# Patient Record
Sex: Male | Born: 1955
Health system: Southern US, Community
[De-identification: ages and names within clinical notes are randomized; demographics above are authoritative.]

## PROBLEM LIST (undated history)

## (undated) DIAGNOSIS — F329 Major depressive disorder, single episode, unspecified: Secondary | ICD-10-CM

## (undated) DIAGNOSIS — I421 Obstructive hypertrophic cardiomyopathy: Secondary | ICD-10-CM

## (undated) DIAGNOSIS — G43909 Migraine, unspecified, not intractable, without status migrainosus: Secondary | ICD-10-CM

## (undated) DIAGNOSIS — M542 Cervicalgia: Secondary | ICD-10-CM

## (undated) DIAGNOSIS — M1990 Unspecified osteoarthritis, unspecified site: Secondary | ICD-10-CM

## (undated) DIAGNOSIS — J302 Other seasonal allergic rhinitis: Secondary | ICD-10-CM

## (undated) DIAGNOSIS — Z5189 Encounter for other specified aftercare: Secondary | ICD-10-CM

## (undated) DIAGNOSIS — E785 Hyperlipidemia, unspecified: Secondary | ICD-10-CM

## (undated) DIAGNOSIS — G709 Myoneural disorder, unspecified: Secondary | ICD-10-CM

## (undated) DIAGNOSIS — G8929 Other chronic pain: Secondary | ICD-10-CM

## (undated) DIAGNOSIS — T7840XA Allergy, unspecified, initial encounter: Secondary | ICD-10-CM

## (undated) DIAGNOSIS — K589 Irritable bowel syndrome without diarrhea: Secondary | ICD-10-CM

## (undated) DIAGNOSIS — K219 Gastro-esophageal reflux disease without esophagitis: Secondary | ICD-10-CM

## (undated) DIAGNOSIS — F32A Depression, unspecified: Secondary | ICD-10-CM

## (undated) DIAGNOSIS — N200 Calculus of kidney: Secondary | ICD-10-CM

## (undated) DIAGNOSIS — J45909 Unspecified asthma, uncomplicated: Secondary | ICD-10-CM

## (undated) DIAGNOSIS — H269 Unspecified cataract: Secondary | ICD-10-CM

## (undated) DIAGNOSIS — B019 Varicella without complication: Secondary | ICD-10-CM

## (undated) DIAGNOSIS — E669 Obesity, unspecified: Secondary | ICD-10-CM

## (undated) HISTORY — DX: Other seasonal allergic rhinitis: J30.2

## (undated) HISTORY — DX: Other chronic pain: G89.29

## (undated) HISTORY — PX: MOUTH SURGERY: SHX715

## (undated) HISTORY — DX: Unspecified asthma, uncomplicated: J45.909

## (undated) HISTORY — DX: Allergy, unspecified, initial encounter: T78.40XA

## (undated) HISTORY — DX: Myoneural disorder, unspecified: G70.9

## (undated) HISTORY — DX: Depression, unspecified: F32.A

## (undated) HISTORY — DX: Migraine, unspecified, not intractable, without status migrainosus: G43.909

## (undated) HISTORY — DX: Hyperlipidemia, unspecified: E78.5

## (undated) HISTORY — DX: Unspecified cataract: H26.9

## (undated) HISTORY — DX: Gastro-esophageal reflux disease without esophagitis: K21.9

## (undated) HISTORY — DX: Cervicalgia: M54.2

## (undated) HISTORY — DX: Major depressive disorder, single episode, unspecified: F32.9

## (undated) HISTORY — DX: Varicella without complication: B01.9

## (undated) HISTORY — DX: Encounter for other specified aftercare: Z51.89

## (undated) HISTORY — DX: Calculus of kidney: N20.0

## (undated) HISTORY — DX: Unspecified osteoarthritis, unspecified site: M19.90

## (undated) HISTORY — DX: Obstructive hypertrophic cardiomyopathy: I42.1

## (undated) HISTORY — DX: Obesity, unspecified: E66.9

## (undated) HISTORY — DX: Irritable bowel syndrome without diarrhea: K58.9

---

## 1978-10-25 HISTORY — PX: OTHER SURGICAL HISTORY: SHX169

## 1983-10-26 DIAGNOSIS — Z5189 Encounter for other specified aftercare: Secondary | ICD-10-CM

## 1983-10-26 HISTORY — DX: Encounter for other specified aftercare: Z51.89

## 1983-10-26 HISTORY — PX: OTHER SURGICAL HISTORY: SHX169

## 1988-10-25 HISTORY — PX: OTHER SURGICAL HISTORY: SHX169

## 2000-11-25 DIAGNOSIS — S199XXA Unspecified injury of neck, initial encounter: Secondary | ICD-10-CM | POA: Insufficient documentation

## 2006-06-23 ENCOUNTER — Ambulatory Visit: Payer: Self-pay | Admitting: Family Medicine

## 2006-08-14 ENCOUNTER — Emergency Department (HOSPITAL_COMMUNITY): Admission: EM | Admit: 2006-08-14 | Discharge: 2006-08-14 | Payer: Self-pay | Admitting: Emergency Medicine

## 2007-02-13 ENCOUNTER — Ambulatory Visit: Payer: Self-pay | Admitting: Family Medicine

## 2007-03-09 ENCOUNTER — Ambulatory Visit: Payer: Self-pay | Admitting: Family Medicine

## 2007-09-08 ENCOUNTER — Ambulatory Visit: Payer: Self-pay | Admitting: Family Medicine

## 2007-10-16 ENCOUNTER — Ambulatory Visit: Payer: Self-pay | Admitting: Family Medicine

## 2007-11-10 ENCOUNTER — Ambulatory Visit: Payer: Self-pay | Admitting: Family Medicine

## 2008-05-30 ENCOUNTER — Ambulatory Visit: Payer: Self-pay | Admitting: Family Medicine

## 2008-07-29 ENCOUNTER — Ambulatory Visit: Payer: Self-pay | Admitting: Family Medicine

## 2009-03-31 ENCOUNTER — Ambulatory Visit: Payer: Self-pay | Admitting: Family Medicine

## 2009-05-23 ENCOUNTER — Ambulatory Visit: Payer: Self-pay | Admitting: Family Medicine

## 2009-07-08 ENCOUNTER — Ambulatory Visit: Payer: Self-pay | Admitting: Family Medicine

## 2009-08-22 ENCOUNTER — Ambulatory Visit: Payer: Self-pay | Admitting: Family Medicine

## 2009-09-23 ENCOUNTER — Ambulatory Visit: Payer: Self-pay | Admitting: Family Medicine

## 2010-06-03 ENCOUNTER — Ambulatory Visit: Payer: Self-pay | Admitting: Family Medicine

## 2010-06-10 ENCOUNTER — Ambulatory Visit: Payer: Self-pay | Admitting: Vascular Surgery

## 2010-06-10 ENCOUNTER — Encounter: Payer: Self-pay | Admitting: Family Medicine

## 2010-06-10 ENCOUNTER — Ambulatory Visit (HOSPITAL_COMMUNITY): Admission: RE | Admit: 2010-06-10 | Discharge: 2010-06-10 | Payer: Self-pay | Admitting: Family Medicine

## 2010-07-29 ENCOUNTER — Ambulatory Visit: Payer: Self-pay | Admitting: Family Medicine

## 2010-08-05 ENCOUNTER — Ambulatory Visit: Payer: Self-pay | Admitting: Family Medicine

## 2011-03-02 ENCOUNTER — Encounter: Payer: Self-pay | Admitting: Family Medicine

## 2011-03-02 ENCOUNTER — Ambulatory Visit (INDEPENDENT_AMBULATORY_CARE_PROVIDER_SITE_OTHER): Payer: Federal, State, Local not specified - PPO | Admitting: Family Medicine

## 2011-03-02 VITALS — BP 120/74 | HR 88 | Wt 180.0 lb

## 2011-03-02 DIAGNOSIS — Z209 Contact with and (suspected) exposure to unspecified communicable disease: Secondary | ICD-10-CM

## 2011-03-02 DIAGNOSIS — M549 Dorsalgia, unspecified: Secondary | ICD-10-CM | POA: Insufficient documentation

## 2011-03-02 DIAGNOSIS — I471 Supraventricular tachycardia: Secondary | ICD-10-CM | POA: Insufficient documentation

## 2011-03-02 DIAGNOSIS — G8929 Other chronic pain: Secondary | ICD-10-CM | POA: Insufficient documentation

## 2011-03-02 NOTE — Progress Notes (Signed)
  Subjective:    Patient ID: Edward Curtis, male    DOB: 1956/08/06, 55 y.o.   MRN: [id number]  HPI he is here for consultation. He is potentially getting involved in a new relationship and wants to make sure he has no STDs. He is also concerned about intermittent difficulty with erectile dysfunction. He is also had some intermittent rectal discomfort and thinks he might potentially have hemorrhoids. He also has had some difficulty with getting and maintaining erections.   Review of Systems     Objective:   Physical Exam alert and in no distress. Rectal exam shows no external hemorrhoids or fissures. Digital rectal exam was guaiac-negative and no lesions were palpable.        Assessment & Plan:  Concern for STD.ED  A sample of Stayxn was given with instructions on proper use. Did reassure him that I did not think he would need to use this regularly.

## 2011-03-03 LAB — HIV ANTIBODY (ROUTINE TESTING W REFLEX): HIV: NONREACTIVE

## 2011-03-04 ENCOUNTER — Telehealth: Payer: Self-pay

## 2011-03-04 NOTE — Telephone Encounter (Signed)
Pt informed labs are neg

## 2011-06-08 ENCOUNTER — Encounter: Payer: Self-pay | Admitting: Medical

## 2011-06-08 ENCOUNTER — Ambulatory Visit (INDEPENDENT_AMBULATORY_CARE_PROVIDER_SITE_OTHER): Payer: Federal, State, Local not specified - PPO | Admitting: Medical

## 2011-06-08 DIAGNOSIS — J309 Allergic rhinitis, unspecified: Secondary | ICD-10-CM

## 2011-06-08 DIAGNOSIS — Z1211 Encounter for screening for malignant neoplasm of colon: Secondary | ICD-10-CM

## 2011-06-08 DIAGNOSIS — N529 Male erectile dysfunction, unspecified: Secondary | ICD-10-CM

## 2011-06-08 MED ORDER — VARDENAFIL HCL 10 MG PO TBDP: 10.0000 mg | ORAL_TABLET | Freq: Every day | ORAL | Status: DC

## 2011-06-08 MED ORDER — FLUTICASONE PROPIONATE 50 MCG/ACT NA SUSP: 2.0000 | Freq: Every day | NASAL | Status: DC

## 2011-06-08 NOTE — Progress Notes (Signed)
  Subjective:   HPI  Edward Curtis is a 55 y.o. male who presents for multiple complaints.  He notes history of sinusitis. He recently has had some allergy symptoms, but ran out of his Flonase. He would like this refilled. He has had some nasal congestion, but no current sinus pressure, fever, thick discharge.  He knows he's never had a colonoscopy, but his paternal grandfather had colon cancer. He denies any recent changes in bowels, no blood in the stool. He would like a referral for his first colonoscopy.  He notes some problems erectile dysfunction. He notes he has no problems getting erection but can't keep them.   He had seen Dr. Susann Givens for this with trial  sample of Staxyn 10mg  which worked really well. He like to continue with this.   He denies decreased libido or fatigue.    No other aggravating or relieving factors.  No other c/o.  The following portions of the patient's history were reviewed and updated as appropriate: allergies, current medications, past family history, past medical history, past social history, past surgical history and problem list.   Past Medical History  Diagnosis Date  . Allergy   . Tachycardia   . Calcium oxalate renal stones   . Chronic back pain   . Chronic neck pain     Review of Systems Constitutional: denies fever, chills, sweats, unexpected weight change, anorexia, fatigue Allergy: +congestion, sneezing Dermatology: denies rash ENT: +occasional runny nose, sinus pain; denies ear pain, sore throat,  Cardiology: denies chest pain, palpitations, edema Respiratory: denies cough, shortness of breath, wheezing Gastroenterology: denies abdominal pain, nausea, vomiting, diarrhea, constipation Hematology: denies bleeding or bruising problems Urology: denies dysuria, difficulty urinating, hematuria, urinary frequency, urgency Neurology: no headache, weakness, tingling, numbness      Objective:   Physical Exam  General appearance: alert, no  distress, WD/WN, white male HEENT: normocephalic, sclerae anicteric, PERRLA, EOMi, TMs pearly, nares patent, no discharge or erythema, pharynx normal Oral cavity: MMM, no lesions Neck: supple, no lymphadenopathy, no thyromegaly, no masses Heart: RRR, normal S1, S2, no murmurs Lungs: CTA bilaterally, no wheezes, rhonchi, or rales Abdomen: +bs, soft, non tender, non distended, no masses, no hepatomegaly, no splenomegaly Extremities: no edema, no cyanosis, no clubbing Pulses: 2+ symmetric, upper and lower extremities, normal cap refill    Assessment :    Encounter Diagnoses  Name Primary?  . Allergic rhinitis Yes  . ED (erectile dysfunction)   . Screen for colon cancer      Plan:    Allergic rhinitis-continue Flonase, refilled prescription today  ED - gave script for Staxyn 10mg  and samples.  Discussed risks/benefits of medication.  Recheck in 3 mo on prostate exam, possibly testosterone level, recheck on meds in general.  Screen for colon cancer - will refer to GI for first screening colonoscopy.

## 2011-06-16 ENCOUNTER — Encounter: Payer: Self-pay | Admitting: Gastroenterology

## 2011-07-15 ENCOUNTER — Other Ambulatory Visit: Payer: Federal, State, Local not specified - PPO | Admitting: Gastroenterology

## 2011-08-13 ENCOUNTER — Ambulatory Visit (INDEPENDENT_AMBULATORY_CARE_PROVIDER_SITE_OTHER): Payer: Federal, State, Local not specified - PPO | Admitting: Family Medicine

## 2011-08-13 ENCOUNTER — Encounter: Payer: Self-pay | Admitting: Family Medicine

## 2011-08-13 VITALS — BP 102/74 | HR 112 | Temp 98.3°F | Wt 187.0 lb

## 2011-08-13 DIAGNOSIS — J019 Acute sinusitis, unspecified: Secondary | ICD-10-CM

## 2011-08-13 DIAGNOSIS — J301 Allergic rhinitis due to pollen: Secondary | ICD-10-CM

## 2011-08-13 MED ORDER — CLARITHROMYCIN 500 MG PO TABS: 500.0000 mg | ORAL_TABLET | Freq: Two times a day (BID) | ORAL | Status: DC

## 2011-08-13 NOTE — Progress Notes (Signed)
  Subjective:    Patient ID: Edward Curtis, male    DOB: Jan 27, 1956, 55 y.o.   MRN: [id number]  HPI Approximately 3 days ago he had a slight sore throat and the next day the symptoms worsened with congestion,cough, sneeze, headache, upper tooth discomfort, fatigue and PND. No fever or chills. Sometimes a sneezing and coughing do cause some radiation of pain down his arm. He has a previous history of cervical disc disease with epidural injections he does not smoke. He has springtime for allergies and uses OTC meds and Flonase   Review of Systems     Objective:   Physical Exam alert and in no distress. Tympanic membranes and canals are normal. Throat is clear. Tonsils are normal. Neck is supple without adenopathy or thyromegaly. Cardiac exam shows a regular sinus rhythm without murmurs or gallops. Lungs are clear to auscultation. Nasal mucosa is red. Tender over frontal and especially maxillary sinuses.        Assessment & Plan:   1. Sinusitis, acute   2. Allergic rhinitis due to pollen    will place him on Biaxin. He is to call me if not entirely better at the end of the course.

## 2011-08-13 NOTE — Patient Instructions (Signed)
Take all the antibiotic and if not totally back to normal at the end of it, call me.

## 2011-09-15 ENCOUNTER — Ambulatory Visit (INDEPENDENT_AMBULATORY_CARE_PROVIDER_SITE_OTHER): Payer: Federal, State, Local not specified - PPO | Admitting: Family Medicine

## 2011-09-15 ENCOUNTER — Encounter: Payer: Self-pay | Admitting: Family Medicine

## 2011-09-15 VITALS — BP 120/78 | HR 84 | Wt 192.0 lb

## 2011-09-15 DIAGNOSIS — Z209 Contact with and (suspected) exposure to unspecified communicable disease: Secondary | ICD-10-CM

## 2011-09-15 NOTE — Progress Notes (Signed)
  Subjective:    Patient ID: Edward Curtis, male    DOB: 04/11/1956, 55 y.o.   MRN: [id number]  HPI He is here for evaluation of possible STD exposure. He did have sexual exposure with oral sex receptive 10 days ago. Presently he is having no dysuria or discharge. He would like to be safe and be tested.   Review of Systems     Objective:   Physical Exam Exam of the penis shows no discharge. GC Chlamydia culture taken.       Assessment & Plan:   1. Contact with or exposure to unspecified communicable disease  RPR, GC/chlamydia probe amp, genital

## 2011-09-15 NOTE — Patient Instructions (Signed)
I will call you with the results in a few days

## 2011-09-20 ENCOUNTER — Telehealth: Payer: Self-pay | Admitting: Family Medicine

## 2011-09-24 NOTE — Telephone Encounter (Signed)
1 WK OLD

## 2011-10-26 HISTORY — PX: OTHER SURGICAL HISTORY: SHX169

## 2011-11-25 ENCOUNTER — Ambulatory Visit (INDEPENDENT_AMBULATORY_CARE_PROVIDER_SITE_OTHER): Payer: Federal, State, Local not specified - PPO | Admitting: Family Medicine

## 2011-11-25 ENCOUNTER — Encounter: Payer: Self-pay | Admitting: Family Medicine

## 2011-11-25 DIAGNOSIS — M549 Dorsalgia, unspecified: Secondary | ICD-10-CM

## 2011-11-25 DIAGNOSIS — R1011 Right upper quadrant pain: Secondary | ICD-10-CM

## 2011-11-25 DIAGNOSIS — Z0000 Encounter for general adult medical examination without abnormal findings: Secondary | ICD-10-CM

## 2011-11-25 DIAGNOSIS — N200 Calculus of kidney: Secondary | ICD-10-CM

## 2011-11-25 DIAGNOSIS — F410 Panic disorder [episodic paroxysmal anxiety] without agoraphobia: Secondary | ICD-10-CM

## 2011-11-25 DIAGNOSIS — G8929 Other chronic pain: Secondary | ICD-10-CM

## 2011-11-25 DIAGNOSIS — J301 Allergic rhinitis due to pollen: Secondary | ICD-10-CM

## 2011-11-25 LAB — COMPREHENSIVE METABOLIC PANEL
Albumin: 4.9 g/dL (ref 3.5–5.2)
Alkaline Phosphatase: 67 U/L (ref 39–117)
BUN: 17 mg/dL (ref 6–23)
Glucose, Bld: 92 mg/dL (ref 70–99)
Total Bilirubin: 0.5 mg/dL (ref 0.3–1.2)

## 2011-11-25 LAB — CBC WITH DIFFERENTIAL/PLATELET
Basophils Relative: 0 % (ref 0–1)
Eosinophils Absolute: 0.2 10*3/uL (ref 0.0–0.7)
HCT: 46.6 % (ref 39.0–52.0)
Hemoglobin: 16 g/dL (ref 13.0–17.0)
MCH: 31.7 pg (ref 26.0–34.0)
MCHC: 34.3 g/dL (ref 30.0–36.0)
Monocytes Absolute: 0.7 10*3/uL (ref 0.1–1.0)
Monocytes Relative: 10 % (ref 3–12)

## 2011-11-25 LAB — LIPID PANEL
HDL: 30 mg/dL — ABNORMAL LOW (ref >39)
Triglycerides: 563 mg/dL — ABNORMAL HIGH (ref <150)

## 2011-11-25 NOTE — Progress Notes (Signed)
  Subjective:    Patient ID: Edward Curtis, male    DOB: Mar 24, 1956, 56 y.o.   MRN: [id number]  HPI He is here for complete examination. He continues to have difficulty with right upper quadrant pain that he notices especially when he has spicy or greasy foods. He does have a strong family history for gallbladder disease. Review of his record indicates he needs a colonoscopy. Further discussion indicates that he has tried on several occasions get this done but has had complications. He does plan on trying again. He does have intermittent difficulty with back pain and handles this to his specialist. His allergies are under good control. He rarely has panic attacks and does well with Klonopin. He does have some difficulty with DVT and again Mrs. Peggye Pitt is a medications he is on. 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  Review of Systems Negative except as above    Objective:   Physical Exam BP 120/70  Pulse 85  Ht 5\' 9"  (1.753 m)  Wt 202 lb (91.627 kg)  BMI 29.83 kg/m2  General Appearance:    Alert, cooperative, no distress, appears stated age  Head:    Normocephalic, without obvious abnormality, atraumatic  Eyes:    PERRL, conjunctiva/corneas clear, EOM's intact, fundi    benign  Ears:    Normal TM's and external ear canals  Nose:   Nares normal, mucosa normal, no drainage or sinus   tenderness  Throat:   Lips, mucosa, and tongue normal; teeth and gums normal  Neck:   Supple, no lymphadenopathy;  thyroid:  no   enlargement/tenderness/nodules; no carotid   bruit or JVD  Back:    Spine nontender, no curvature, ROM normal, no CVA     tenderness  Lungs:     Clear to auscultation bilaterally without wheezes, rales or     ronchi; respirations unlabored  Chest Wall:    No tenderness or deformity   Heart:    Regular rate and rhythm, S1 and S2 normal, no murmur, rub   or gallop  Breast Exam:    No chest wall tenderness, masses or gynecomastia  Abdomen:      Soft, non-tender, nondistended, normoactive bowel sounds,    no masses, no hepatosplenomegaly  Genitalia:    Normal male external genitalia without lesions.  Testicles without masses.  No inguinal hernias.  Rectal:   deferred   Extremities:   No clubbing, cyanosis or edema  Pulses:   2+ and symmetric all extremities  Skin:   Skin color, texture, turgor normal, no rashes or lesions  Lymph nodes:   Cervical, supraclavicular, and axillary nodes normal  Neurologic:   CNII-XII intact, normal strength, sensation and gait; reflexes 2+ and symmetric throughout          Psych:   Normal mood, affect, hygiene and grooming.           Assessment & Plan:   1. RUQ pain  US Abdomen Complete  2. Kidney stones    3. Panic attacks    4. Chronic back pain    5. Allergic rhinitis due to pollen    6. Routine general medical examination at a health care facility  CBC with Differential, Comprehensive metabolic panel, Lipid panel   is to continue on his present medications. If the ultrasound is negative, I will get a HIDA scan. He is a discussed and he declined.

## 2011-11-26 ENCOUNTER — Ambulatory Visit
Admission: RE | Admit: 2011-11-26 | Discharge: 2011-11-26 | Disposition: A | Discharge status: Home / Self Care | Payer: Federal, State, Local not specified - PPO | Source: Ambulatory Visit | Attending: Family Medicine | Admitting: Family Medicine

## 2011-11-26 DIAGNOSIS — R1011 Right upper quadrant pain: Secondary | ICD-10-CM

## 2011-11-29 ENCOUNTER — Telehealth: Payer: Self-pay

## 2011-11-29 ENCOUNTER — Other Ambulatory Visit: Payer: Self-pay

## 2011-11-29 DIAGNOSIS — R1011 Right upper quadrant pain: Secondary | ICD-10-CM

## 2011-11-29 NOTE — Telephone Encounter (Signed)
Pt having HIDA scan FEB 12 and needs appt to discuss labs per jcl left message about this

## 2011-12-07 ENCOUNTER — Telehealth: Payer: Self-pay | Admitting: Family Medicine

## 2011-12-07 ENCOUNTER — Encounter (HOSPITAL_COMMUNITY)
Admission: RE | Admit: 2011-12-07 | Discharge: 2011-12-07 | Disposition: A | Discharge status: Home / Self Care | Payer: Federal, State, Local not specified - PPO | Source: Ambulatory Visit | Attending: Family Medicine | Admitting: Family Medicine

## 2011-12-07 ENCOUNTER — Encounter: Payer: Self-pay | Admitting: Family Medicine

## 2011-12-07 ENCOUNTER — Ambulatory Visit (INDEPENDENT_AMBULATORY_CARE_PROVIDER_SITE_OTHER): Payer: Federal, State, Local not specified - PPO | Admitting: Family Medicine

## 2011-12-07 VITALS — BP 114/78 | HR 88 | Ht 69.0 in | Wt 202.0 lb

## 2011-12-07 DIAGNOSIS — R1011 Right upper quadrant pain: Secondary | ICD-10-CM

## 2011-12-07 DIAGNOSIS — K589 Irritable bowel syndrome without diarrhea: Secondary | ICD-10-CM

## 2011-12-07 HISTORY — DX: Irritable bowel syndrome, unspecified: K58.9

## 2011-12-07 MED ORDER — CLONAZEPAM 0.5 MG PO TABS: 0.5000 mg | ORAL_TABLET | Freq: Two times a day (BID) | ORAL | Status: DC | PRN

## 2011-12-07 MED ORDER — SINCALIDE 5 MCG IJ SOLR
INTRAMUSCULAR | Status: AC
  Administered 2011-12-07: 1.8 ug
  Filled 2011-12-07: qty 5

## 2011-12-07 MED ORDER — TECHNETIUM TC 99M MEBROFENIN IV KIT
5.0000 | PACK | Freq: Once | INTRAVENOUS | Status: AC | PRN
  Administered 2011-12-07: 5 via INTRAVENOUS

## 2011-12-07 NOTE — Progress Notes (Signed)
  Subjective:    Patient ID: Edward Curtis, male    DOB: 24-Dec-1955, 56 y.o.   MRN: [id number]  HPI For recheck. Recent ultrasound and HIDA scan was negative. He does still complain of fried and spicy foods causing difficulty otherwise it can occur anytime and not necessarily be related to eating. He is also having some reflux symptoms mainly at night when he has acid/choking sensation. He describes the pain as mid epigastric as well as left and right upper quadrant and lower rib discomfort. He also states that he can have difficulty with abdominal bloating and gas after eating and he describes alternating loose and hard stools. He then stated that several years ago he had difficulty with symptoms similar to this and was diagnosed with IBS. He has been going through a lot of stress from both home and stress. I also reviewed his blood work which did show elevated lipid panel. He then told me that the lipids were much better after he started on a [employer] program. He has stopped the program and has gained roughly 20 pounds.   Review of Systems     Objective:   Physical Exam Alert and in no distress.      Assessment & Plan:  Upper abdominal pain. Possible IBS I had a long discussion with him concerning IBS, his weight gain, his panel. Strongly encouraged him to get back involved with [employer] as well start an exercise program. Also recommended he start back on Prilosec.

## 2011-12-07 NOTE — Telephone Encounter (Signed)
Clonazepam called in. He uses this very sparingly.

## 2011-12-07 NOTE — Telephone Encounter (Signed)
Call and the clonazepam

## 2011-12-07 NOTE — Patient Instructions (Addendum)
Irritable Bowel Syndrome Irritable Bowel Syndrome (IBS) is caused by a disturbance of normal bowel function. Other terms used are spastic colon, mucous colitis, and irritable colon. It does not require surgery, nor does it lead to cancer. There is no cure for IBS. But with proper diet, stress reduction, and medication, you will find that your problems (symptoms) will gradually disappear or improve. IBS is a common digestive disorder. It usually appears in late adolescence or early adulthood. Women develop it twice as often as men. CAUSES  After food has been digested and absorbed in the small intestine, waste material is moved into the colon (large intestine). In the colon, water and salts are absorbed from the undigested products coming from the small intestine. The remaining residue, or fecal material, is held for elimination. Under normal circumstances, gentle, rhythmic contractions on the bowel walls push the fecal material along the colon towards the rectum. In IBS, however, these contractions are irregular and poorly coordinated. The fecal material is either retained too long, resulting in constipation, or expelled too soon, producing diarrhea. SYMPTOMS  The most common symptom of IBS is pain. It is typically in the lower left side of the belly (abdomen). But it may occur anywhere in the abdomen. It can be felt as heartburn, backache, or even as a dull pain in the arms or shoulders. The pain comes from excessive bowel-muscle spasms and from the buildup of gas and fecal material in the colon. This pain:  Can range from sharp belly (abdominal) cramps to a dull, continuous ache.   Usually worsens soon after eating.   Is typically relieved by having a bowel movement or passing gas.  Abdominal pain is usually accompanied by constipation. But it may also produce diarrhea. The diarrhea typically occurs right after a meal or upon arising in the morning. The stools are typically soft and watery. They are  often flecked with secretions (mucus). Other symptoms of IBS include:  Bloating.   Loss of appetite.   Heartburn.   Feeling sick to your stomach (nausea).   Belching   Vomiting   Gas.  IBS may also cause a number of symptoms that are unrelated to the digestive system:  Fatigue.   Headaches.   Anxiety   Shortness of breath   Difficulty in concentrating.   Dizziness.  These symptoms tend to come and go. DIAGNOSIS  The symptoms of IBS closely mimic the symptoms of other, more serious digestive disorders. So your caregiver may wish to perform a variety of additional tests to exclude these disorders. He/she wants to be certain of learning what is wrong (diagnosis). The nature and purpose of each test will be explained to you. TREATMENT A number of medications are available to help correct bowel function and/or relieve bowel spasms and abdominal pain. Among the drugs available are:  Mild, non-irritating laxatives for severe constipation and to help restore normal bowel habits.   Specific anti-diarrheal medications to treat severe or prolonged diarrhea.   Anti-spasmodic agents to relieve intestinal cramps.   Your caregiver may also decide to treat you with a mild tranquilizer or sedative during unusually stressful periods in your life.  The important thing to remember is that if any drug is prescribed for you, make sure that you take it exactly as directed. Make sure that your caregiver knows how well it worked for you. HOME CARE INSTRUCTIONS   Avoid foods that are high in fat or oils. Some examples are:heavy cream, butter, frankfurters, sausage, and other fatty   meats.   Avoid foods that have a laxative effect, such as fruit, fruit juice, and dairy products.   Cut out carbonated drinks, chewing gum, and "gassy" foods, such as beans and cabbage. This may help relieve bloating and belching.   Bran taken with plenty of liquids may help relieve constipation.   Keep track of  what foods seem to trigger your symptoms.   Avoid emotionally charged situations or circumstances that produce anxiety.   Start or continue exercising.   Get plenty of rest and sleep.  MAKE SURE YOU:   Understand these instructions.   Will watch your condition.   Will get help right away if you are not doing well or get worse.  Document Released: 10/11/2005 Document Revised: 06/23/2011 Document Reviewed: 05/31/2008 [facility] Patient Information 2012 [place], [place]. Get back on 40 mg of Prilosec daily for the next several weeks to help with her reflux

## 2011-12-07 NOTE — Telephone Encounter (Signed)
Dr.Lalonde did you see this 

## 2012-02-10 ENCOUNTER — Ambulatory Visit (INDEPENDENT_AMBULATORY_CARE_PROVIDER_SITE_OTHER): Payer: Federal, State, Local not specified - PPO | Admitting: Medical

## 2012-02-10 ENCOUNTER — Encounter: Payer: Self-pay | Admitting: Medical

## 2012-02-10 VITALS — BP 100/70 | HR 92 | Temp 98.3°F | Resp 16 | Wt 201.0 lb

## 2012-02-10 DIAGNOSIS — K1120 Sialoadenitis, unspecified: Secondary | ICD-10-CM

## 2012-02-10 MED ORDER — CEFUROXIME AXETIL 500 MG PO TABS: 500.0000 mg | ORAL_TABLET | Freq: Two times a day (BID) | ORAL | Status: AC

## 2012-02-10 NOTE — Patient Instructions (Signed)
Parotitis  Parotitis is an inflammation of one or both parotid glands. This is the main salivary gland. It lies behind the angle of the jaw and below the ear lobe. The saliva produced comes out of a tiny opening (duct) inside the cheek on either side. It is usually at the level of the upper back teeth. If the parotid gland is swollen, the ear is pushed up and out in a particular way. This helps set this condition apart from a simple lymph gland infection in the same area.  CAUSES   Cases of mumps have mostly disappeared since the start of immunization against mumps. Currently, the most common causes of parotitis are:   Germ (bacterial) infection.   Inflammation of the lymph channels (lymphatics).  Other Uncommon Causes of Parotitis:   Sjogren's syndrome. A condition in which arthritis is associated with a decrease in activity of the glands of the body that produce saliva and tears. Some people are bothered by a dry mouth and intermittent salivary gland enlargement. The diagnosis is made with blood tests or by examination of a piece of tissue from the inside of the lip.   Atypical mycobacteria. Can give rise to a condition that usually infects children. It is a germ similar to tuberculosis. It is often resistant to antibiotic treatment. It may require surgical treatment and removal of the infected salivary gland.   Actinomycosis. An infection of the parotid gland that may also involve the overlying skin. The diagnosis is made by detecting granules of sulphur, produced by the bacteria, on microscopic examination. Treatment is with a prolonged course of penicillin for up to one year.  Acute (Sudden Onset) Bacertial Parotitis  This is a sudden inflammatory response to bacterial infection that causes:   Redness (erythema).   Pain.   Swelling.   Tenderness over the gland on the side of the cheek.   The appearance of pus from the opening of the duct on the inside of the cheek.  It used to be common in dehydrated  and debilitated patients, and often following surgery. It is now more commonly seen after radiotherapy (X-ray treatment) or in patients with a poorly working immune system. Treatment includes:    Correction of the lack of fluids (rehydration).   Medications which kill germs(antibiotics).   Pain relief.  Chronic Recurrent Parotitis  This refers to repeated episodes of discomfort and swelling of the parotid gland. This occurs often after eating. It is caused by decreased flow of saliva. This is often due to either blockage of the duct by a stone or the formation of a duct narrowing. It is treated with:    Gland massage.   Methods to stimulate the flow of saliva (for example, giving lemon juice).   Antibiotics if required.  Surgery to remove the gland is possible. The benefits of surgery need to be balanced against the risk of damage to the facial nerve. The facial nerve allows the muscles of facial expression to function. Damage to this can cause paralysis of one side of the face. X-ray treatment (radiotherapy) and treatment with steroid tablets have been considered. But they are thought to be ineffective.  Viral Parotitis  The most common viral cause of parotitis is mumps. It usually affects 4 to 10 year olds. It causes painful swelling of both parotid glands.  Recurrent Parotitis in Children  This condition is thought to be due to swelling or ballooning of the ducts (ectasia). It results in the same problems(symptoms ) as acute   penicillin. It usually gets well by itself without treatment (self-limiting). Surgery is usually not needed. Tuberculosis The parotid glands may become infected with the same bacteria causing tuberculosis (TB). Treatment is with anti-tuberculous antibiotic therapy. HOME CARE INSTRUCTIONS   Apply ice bags about every 2 hours, for 15 to 20 minutes, while awake, to the sore  gland. Place ice in a plastic bag with a towel around it to prevent frostbite to skin. Continue for 24 hours and then as directed by your caregiver.   Only take over-the-counter or prescription medicines for pain, discomfort, or fever as directed by your caregiver.  SEEK IMMEDIATE MEDICAL CARE IF:   There is increased pain or swelling in your gland that is not controlled with medication.   You have a fever.  Document Released: 04/02/2002 Document Revised: 09/30/2011 Document Reviewed: 09/06/2011 [facility] Patient Information 2012 [place], [place].   Sialadenitis Sialadenitis is an inflammation (soreness) of the salivary glands. The parotid is the main salivary gland. It lies behind the angle of the jaw below the ear. The saliva produced comes out of a tiny opening (duct) inside the cheek on either side. This is usually at the level of the upper back teeth. If it is swollen, the ear is pushed up and out. This helps tell this condition apart from a simple lymph gland infection (swollen glands) in the same area. Mumps has mostly disappeared since the start of immunization against mumps. Now the most common cause of parotitis is germ (bacterial) infection or inflammation of the lymphatics (the lymph channels). The other major salivary gland is located in the floor of the mouth. Smaller salivary glands are located in the mouth. This includes the:  Lips.   Lining of the mouth.   Pharynx.   Hard palate (front part of the roof of the mouth).  The salivary glands do many things, including:  Lubrication.   Breaking down food.   Production of hormones and antibodies (to protect against germs which may cause illness).   Help with the sense of taste.  ACUTE BACTERIAL SIALADENITIS This is a sudden inflammatory response to bacterial infection. This causes redness, pain, swelling and tenderness over the infected gland. In the past, it was common in dehydrated and debilitated patients often following an  operation. It is now more commonly seen:  After radiotherapy.   In patients with poor immune systems.  Treatment is:  The correction of fluid balance (rehydration).   Medicine that kill germs (antibiotics).   Pain relief.  CHRONIC RECURRENT SIALADENITIS This refers to repeated episodes of discomfort and swelling of one of the salivary glands. It often occurs after eating. Chronic sialadenitis is usually less painful. It is associated with recurrent enlargement of a salivary gland, often following meals, and typically with an absence of redness. The chronic form of the disease often is associated with conditions linked to decreased salivary flow, rather than dehydration (loss of body fluids). These conditions include:  A stone, or concretion, formed in the gallbladder, kidneys, or other parts of the body (calculi).   Salivary stasis.   A change in the fluid and electrolyte (the salts in your body fluids) makeup of the gland.  It is treated with:  Gland massage.   Methods to stimulate the flow of saliva, (for example, lemon juice).   Antibiotics if required.  Surgery to remove the gland is possible, but its benefits need to be balanced against risks.  VIRAL SIALADENITIS Several viruses infect the salivary glands. Some of these include  the mumps virus that commonly infects the parotid gland. Other viruses causing problems are:  The HIV virus.   Herpes.   Some of the influenza ("flu") viruses.  RECURRENT SIALADENITIS IN CHILDREN This condition is thought to be due to swelling or ballooning of the ducts. It results in the same symptoms as acute bacterial parotitis. It is usually caused by germs (bacteria). It is often treated using penicillin. It may get well without treatment. Surgery is usually not required. TUBERCULOUS SIALADENITIS The salivary glands may become infected with the same bacteria causing tuberculosis ("TB"). Treatment is with anti-tuberculous antibiotic  therapy. OTHER UNCOMMON CAUSES OF SIALADENITIS   Sjogren's syndrome is a condition in which arthritis is associated with a decrease in activity of the glands of the body that produce saliva and tears. The diagnosis is made with blood tests or by examination of a piece of tissue from the inside of the lip. Some people with this condition are bothered by:   A dry mouth.   Intermittent salivary gland enlargement.   Atypical mycobacteria is a germ similar to tuberculosis. It often infects children. It is often resistant to antibiotic treatment. It may require surgical treatment to remove the infected salivary gland.   Actinomycosis is an infection of the parotid gland that may also involve the overlying skin. The diagnosis is made by detecting granules of sulphur produced by the bacteria on microscopic examination. Treatment is a prolonged course of penicillin for up to one year.   Nutritional causes include vitamin deficiencies and bulimia.   Diabetes and problems with your thyroid.   Obesity, cirrhosis, and malabsorption are some metabolic causes.  HOME CARE INSTRUCTIONS   Apply ice bags every 2 hours for 15 to 20 minutes, while awake, to the sore gland for 24 hours, then as directed by your caregiver. Place the ice in a plastic bag with a towel around it to prevent frostbite to the skin.   Only take over-the-counter or prescription medicines for pain, discomfort, or fever as directed by your caregiver.  SEEK IMMEDIATE MEDICAL CARE IF:   There is increased pain or swelling in your gland that is not controlled with medicine.   An oral temperature above 102 F (38.9 C) develops, not controlled by medicine.   You develop difficulty opening your mouth, swallowing, or speaking.  Document Released: 04/02/2002 Document Revised: 09/30/2011 Document Reviewed: 05/27/2008 [facility] Patient Information 2012 [place], [place].

## 2012-02-10 NOTE — Progress Notes (Signed)
Subjective: Here for several day hx/o right jaw swollen.  Swelling and pain comes and goes.  Had similar in past with blocked salivary gland.  Eating a meal makes this worse.  Denise fever, warmth, no scrotum swelling, no other symptom.   No other aggravating or relieving factors.  No other c/o.  The following portions of the patient's history were reviewed and updated as appropriate: allergies, current medications, past family history, past medical history, past social history, past surgical history and problem list.  Past Medical History  Diagnosis Date  . Allergy   . Tachycardia   . Calcium oxalate renal stones   . Chronic back pain   . Chronic neck pain     Allergies  Allergen Reactions  . Erythromycin     emycin and clindamycin with nausea  . Ibuprofen   . Penicillins   . Sulfa Antibiotics    Review of Systems Constitutional: -fever, -chills, -sweats, -unexpected -weight change,-fatigue ENT: +sneezing, allergy symptoms Cardiology:  -chest pain, -palpitations, -edema Respiratory: -cough, -shortness of breath, -wheezing Gastroenterology: -abdominal pain, -nausea, -vomiting, -diarrhea, -constipation  Musculoskeletal: -arthralgias, -myalgias, -joint swelling, -back pain Urology: -dysuria, -difficulty urinating, -hematuria, -urinary frequency, -urgency       Objective:   Physical Exam  General appearance: alert, no distress, WD/WN HEENT: normocephalic, swelling and somewhat tender of right parotid region/right face posterior mandible region, sclerae anicteric, TMs pearly, nares patent, no discharge or erythema, pharynx normal Oral cavity: MMM, no lesions Neck: supple, no lymphadenopathy, no thyromegaly, no masses Heart: RRR, normal S1, S2, no murmurs Lungs: CTA bilaterally, no wheezes, rhonchi, or rales   Assessment and Plan :     Encounter Diagnosis  Name Primary?  . Parotitis Yes   Script for Ceftin, warm compresses, lemon drops, hydrate well, call if worse or not  improving.

## 2012-06-23 ENCOUNTER — Ambulatory Visit (INDEPENDENT_AMBULATORY_CARE_PROVIDER_SITE_OTHER): Payer: Federal, State, Local not specified - PPO | Admitting: Medical

## 2012-06-23 ENCOUNTER — Encounter: Payer: Self-pay | Admitting: Medical

## 2012-06-23 VITALS — BP 112/78 | HR 100 | Temp 98.7°F | Resp 18 | Wt 208.0 lb

## 2012-06-23 DIAGNOSIS — M5481 Occipital neuralgia: Secondary | ICD-10-CM

## 2012-06-23 DIAGNOSIS — M62838 Other muscle spasm: Secondary | ICD-10-CM

## 2012-06-23 DIAGNOSIS — M531 Cervicobrachial syndrome: Secondary | ICD-10-CM

## 2012-06-23 DIAGNOSIS — H9209 Otalgia, unspecified ear: Secondary | ICD-10-CM

## 2012-06-23 MED ORDER — CLARITHROMYCIN 500 MG PO TABS: 500.0000 mg | ORAL_TABLET | Freq: Two times a day (BID) | ORAL | Status: AC

## 2012-06-23 MED ORDER — CLONAZEPAM 0.5 MG PO TABS: 0.5000 mg | ORAL_TABLET | Freq: Two times a day (BID) | ORAL | Status: DC | PRN

## 2012-06-23 NOTE — Progress Notes (Signed)
Subjective: Here for multiple symptoms occuring in 1 area (neck).  He notes hx/o permanent neck injury, gets flares ups of this frequently.  But for the past 10 days having episodes.  He tends to carry tension and stress in the neck.  He commutes back and forth from [facility] daily, gets tension and spasm in the neck.  But lately getting brief 2 minute episodes of stiffness, tight shoulders, dizziness, pressure around the ears and head, and at times hearing goes in and out.  Sometimes gets headache.  He sometimes gets dizziness if standing up too fast or getting out of his car.  He wonders about sinus infection given recent popping in ears.  He denies these symptoms when he awakes rested in the mornings.  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    He denies chest pain, palpitations, SOB, vision changes, numbness, tingling, weakness, no syncope.  No NVD.  He has had some throat irritation and drainage.    He reports hx/o wall thickening of his heart, sees cardiology yearly.  Sees Dr. Orland Mustard for worker comp neck and back injury.  He does have hx/o migraine and tension headaches.    He does not exercise in general.  Past Medical History  Diagnosis Date  . Allergy   . Tachycardia   . Calcium oxalate renal stones   . Chronic back pain   . Chronic neck pain    ROS as noted above in HPI    Objective:   Physical Exam  Filed Vitals:   06/23/12 1435  BP: 112/78  Pulse: 100  Temp: 98.7 F (37.1 C)  Resp: 18    General appearance: alert, no distress, WD/WN HEENT: normocephalic, sclerae anicteric, TMs pearly, nares patent, no discharge or erythema, pharynx normal Oral cavity: MMM, no lesions Neck and back: tender bilat trapezius and upper back, +spasm, otherwise no lymphadenopathy, no thyromegaly, no masses, no bruits Heart: RRR, normal S1, S2, no murmurs Lungs: CTA bilaterally, no wheezes, rhonchi, or rales Pulses: 2+ symmetric, upper  and lower extremities, normal cap refill Neuro: CN2-12 intact, DTRs normal, strength and sensation normal, non focal   Assessment and Plan :    Encounter Diagnoses  Name Primary?  . Occipital neuralgia Yes  . Neck muscle spasm   . Otalgia    Symptoms suggestive of neck tension, spasm, possible stress related.  Advised trial of his Clonazepam, which was refilled today. He has historically not done well with muscle relaxer. He has some Naprosyn he will use as well.  Advised he use neck and shoulder ROM exercise throughout the day at work.  Advised he begin exercise such as lap swimming, water therapy, or Yoga/stretching.  Wrote script for massage therapy x 2-3 visits.  If not improving recheck.  Regarding his concern for sinus infection, currently exam and symptoms not specific.  Advised he restart Flonase, hydrate well, consider nasal saline.  If over the weekend he gets fever, worse ear pain, sinus pressure, or similar, can begin Biaxin which he has used in the past for sinusitis.  otherwise just hydrate well and use the Flonase.    Call/return if not improving in 4-5 days.

## 2012-06-30 ENCOUNTER — Ambulatory Visit (INDEPENDENT_AMBULATORY_CARE_PROVIDER_SITE_OTHER): Payer: Federal, State, Local not specified - PPO | Admitting: Family Medicine

## 2012-06-30 ENCOUNTER — Encounter: Payer: Self-pay | Admitting: Family Medicine

## 2012-06-30 VITALS — BP 122/80 | HR 102 | Wt 208.0 lb

## 2012-06-30 DIAGNOSIS — H8110 Benign paroxysmal vertigo, unspecified ear: Secondary | ICD-10-CM

## 2012-06-30 NOTE — Progress Notes (Signed)
  Subjective:    Patient ID: Edward Curtis, male    DOB: 12-17-1955, 56 y.o.   MRN: [id number]  HPI He has a two-week history of intermittent ringing in the ears that waxes and wanes, dizzy as well as a tightness in his neck. The symptoms last just for 30-60 seconds and then he is clear. Last night he did wake up warm, diaphoretic and nauseated. Tended to ramble and change from one subject to another. He is stressed over work-related issues. He also scheduled for an upcoming epidural for treatment of chronic back pain. Towards the end of the interview he then stated that the symptoms do tend to be low but more prevalent when he bends forward. He does have underlying allergies and is also on Flonase.   Review of Systems     Objective:   Physical Exam alert and in no distress. Tympanic membranes and canals are normal. Throat is clear. Tonsils are normal. Neck is supple without adenopathy or thyromegaly. Cardiac exam shows a regular sinus rhythm without murmurs or gallops. Lungs are clear to auscultation.    Assessment & Plan:   1. Benign positional vertigo    I explained to him that his symptoms seem to be mainly related around this although it was difficult since tended to ramble and change the subject and seem to be overly concerned on lots of different issues. I tried to reassure him that I found nothing to be worried about. He will keep track of the symptoms and let me know if any changes. One half hour spent discussing all these issues with him.

## 2012-06-30 NOTE — Patient Instructions (Signed)
Pay attention to when it occurs where it occurs head position, anything

## 2012-07-07 ENCOUNTER — Encounter: Payer: Self-pay | Admitting: Internal Medicine

## 2012-07-13 ENCOUNTER — Ambulatory Visit: Payer: Federal, State, Local not specified - PPO | Admitting: Family Medicine

## 2013-10-01 ENCOUNTER — Encounter: Payer: Self-pay | Admitting: Family Medicine

## 2013-10-01 ENCOUNTER — Other Ambulatory Visit: Payer: Self-pay | Admitting: Family Medicine

## 2013-10-01 ENCOUNTER — Ambulatory Visit (INDEPENDENT_AMBULATORY_CARE_PROVIDER_SITE_OTHER): Payer: Federal, State, Local not specified - PPO | Admitting: Family Medicine

## 2013-10-01 ENCOUNTER — Telehealth: Payer: Self-pay

## 2013-10-01 VITALS — BP 114/78 | HR 108 | Wt 220.0 lb

## 2013-10-01 DIAGNOSIS — R609 Edema, unspecified: Secondary | ICD-10-CM

## 2013-10-01 DIAGNOSIS — I4510 Unspecified right bundle-branch block: Secondary | ICD-10-CM

## 2013-10-01 DIAGNOSIS — E785 Hyperlipidemia, unspecified: Secondary | ICD-10-CM

## 2013-10-01 DIAGNOSIS — I422 Other hypertrophic cardiomyopathy: Secondary | ICD-10-CM

## 2013-10-01 LAB — CBC WITH DIFFERENTIAL/PLATELET
Basophils Absolute: 0 10*3/uL (ref 0.0–0.1)
Basophils Relative: 1 % (ref 0–1)
Eosinophils Absolute: 0.2 10*3/uL (ref 0.0–0.7)
Eosinophils Relative: 3 % (ref 0–5)
HCT: 44.3 % (ref 39.0–52.0)
MCH: 30.5 pg (ref 26.0–34.0)
MCHC: 35.4 g/dL (ref 30.0–36.0)
Monocytes Absolute: 0.6 10*3/uL (ref 0.1–1.0)
Neutro Abs: 3.5 10*3/uL (ref 1.7–7.7)
RDW: 13.9 % (ref 11.5–15.5)

## 2013-10-01 NOTE — Progress Notes (Signed)
   Subjective:    Patient ID: Edward Curtis, male    DOB: 1956-01-01, 57 y.o.   MRN: [id number]  HPI For the last several days he has noted swelling in his feet. No history of orthopnea, PND, DOE. Recently he has been on his feet more than in the past. He notes that when he gets up the morning he has less swelling. He has noted no swelling anywhere else. No trouble with urination. He has noted some intermittent fleeting left arm pain but usually when he is sedentary. He does have a history of cervical disc disease. His past medical history is significant for tachycardia as well as a hypertrophic cardiomyopathy. He has seen Dr. Donnie Aho in the past for this.   Review of Systems     Objective:   Physical Exam alert and in no distress. Tympanic membranes and canals are normal. Throat is clear. Tonsils are normal. Neck is supple without adenopathy or thyromegaly. Cardiac exam shows a regular sinus rhythm without murmurs or gallops. Lungs are clear to auscultation. Lower extremity evaluation shows 2+ pitting edema with good pulses and reflexes. It is not red or warm or tender. EKG shows evidence of a right bundle block        Assessment & Plan:  Other hypertrophic cardiomyopathy - Plan: EKG 12-Lead, CBC with Differential, Comprehensive metabolic panel, Lipid panel, Ambulatory referral to Cardiology  Hyperlipidemia LDL goal < 100 - Plan: Lipid panel  Peripheral edema - Plan: EKG 12-Lead, CBC with Differential, Comprehensive metabolic panel  RBBB - Plan: Ambulatory referral to Cardiology  his edema is more than I would expect with plain dependent edema but I do not think this is cardiac in origin. I will refer back to cardiology for routine followup on his cardiomyopathy and for Dr. York Spaniel opinion on the edema.

## 2013-10-01 NOTE — Telephone Encounter (Signed)
PT CALLED AND SAID HE WAS TALKING TO A FRIEND AND THEY TOLD HIM THAT THEY HAD A FRIEND GET BIT BY SPIDER AND IT CAUSED THEIR FEET AND ANKLES AND HE REMEMBERED THAT HE HAS A PLACE ON THE BACK OF HIS LEG THAT LOOKS LIKE A SPIDER BITE AND WANTED TO KNOW IF YOU WANTED TO SEE IT PLEASE ADVISE

## 2013-10-01 NOTE — Telephone Encounter (Signed)
Let him know that the swelling from a spider bite would be much more significant and more like an infection

## 2013-10-01 NOTE — Telephone Encounter (Signed)
PT INFORMED AND HE SAID OKAY

## 2013-10-02 LAB — LIPID PANEL
HDL: 28 mg/dL — ABNORMAL LOW (ref >39)
LDL Cholesterol: 126 mg/dL — ABNORMAL HIGH (ref 0–99)
Total CHOL/HDL Ratio: 7.8 Ratio
Triglycerides: 324 mg/dL — ABNORMAL HIGH (ref <150)
VLDL: 65 mg/dL — ABNORMAL HIGH (ref 0–40)

## 2013-10-02 LAB — COMPREHENSIVE METABOLIC PANEL
AST: 25 U/L (ref 0–37)
Alkaline Phosphatase: 67 U/L (ref 39–117)
BUN: 8 mg/dL (ref 6–23)
Creat: 0.83 mg/dL (ref 0.50–1.35)
Potassium: 4.2 mEq/L (ref 3.5–5.3)
Total Bilirubin: 0.5 mg/dL (ref 0.3–1.2)

## 2013-10-02 LAB — HEMOGLOBIN A1C: Hgb A1c MFr Bld: 5.8 % — ABNORMAL HIGH (ref <5.7)

## 2013-10-04 ENCOUNTER — Encounter: Payer: Self-pay | Admitting: Family Medicine

## 2013-10-04 ENCOUNTER — Ambulatory Visit (INDEPENDENT_AMBULATORY_CARE_PROVIDER_SITE_OTHER): Payer: Federal, State, Local not specified - PPO | Admitting: Family Medicine

## 2013-10-04 VITALS — BP 116/78 | HR 68 | Wt 216.0 lb

## 2013-10-04 DIAGNOSIS — R7309 Other abnormal glucose: Secondary | ICD-10-CM

## 2013-10-04 DIAGNOSIS — L739 Follicular disorder, unspecified: Secondary | ICD-10-CM

## 2013-10-04 DIAGNOSIS — L738 Other specified follicular disorders: Secondary | ICD-10-CM

## 2013-10-04 DIAGNOSIS — R7302 Impaired glucose tolerance (oral): Secondary | ICD-10-CM

## 2013-10-04 NOTE — Progress Notes (Signed)
   Subjective:    Patient ID: Edward Curtis, male    DOB: 1956-02-21, 57 y.o.   MRN: [id number]  HPI He is here for a recheck after recent blood work did show slightly elevated blood sugar as well as elevated lipids. His hemoglobin A1c was 5.8. He continues to have difficulty with bilateral lower extremity swelling as well as a lesion on the inner left thigh which he did he would like me to look at. It has been present for approximately 3 weeks.   Review of Systems     Objective:   Physical Exam Alert and in no distress. Erythematous raised palpable lesions noted in the inner left thigh.       Assessment & Plan:  Glucose intolerance (impaired glucose tolerance)  Folliculitis  I discussed his blood sugar and lipids as well as his weight. Explained that he is now at risk for diabetes and all the complications surrounding this. Strongly encouraged him to make diet and exercise changes. Discussed possible exercises he can do with the orthopedic problems that he is having. Recommended heat to the thigh lesion. He is to followup with Dr. Donnie Aho concerning his general cardiac care and to get his impression about his edema.

## 2013-10-10 ENCOUNTER — Ambulatory Visit (HOSPITAL_COMMUNITY)
Admission: RE | Admit: 2013-10-10 | Discharge: 2013-10-10 | Disposition: A | Discharge status: Home / Self Care | Payer: Federal, State, Local not specified - PPO | Source: Ambulatory Visit | Attending: Vascular Surgery | Admitting: Vascular Surgery

## 2013-10-10 ENCOUNTER — Other Ambulatory Visit (HOSPITAL_COMMUNITY): Payer: Self-pay | Admitting: Cardiology

## 2013-10-10 ENCOUNTER — Encounter: Payer: Self-pay | Admitting: Cardiology

## 2013-10-10 ENCOUNTER — Ambulatory Visit
Admission: RE | Admit: 2013-10-10 | Discharge: 2013-10-10 | Disposition: A | Discharge status: Home / Self Care | Payer: Federal, State, Local not specified - PPO | Source: Ambulatory Visit | Attending: Cardiology | Admitting: Cardiology

## 2013-10-10 ENCOUNTER — Other Ambulatory Visit: Payer: Self-pay | Admitting: Cardiology

## 2013-10-10 DIAGNOSIS — R0600 Dyspnea, unspecified: Secondary | ICD-10-CM

## 2013-10-10 DIAGNOSIS — R609 Edema, unspecified: Secondary | ICD-10-CM

## 2013-10-10 DIAGNOSIS — I421 Obstructive hypertrophic cardiomyopathy: Secondary | ICD-10-CM

## 2013-10-10 DIAGNOSIS — E785 Hyperlipidemia, unspecified: Secondary | ICD-10-CM

## 2013-10-10 DIAGNOSIS — I422 Other hypertrophic cardiomyopathy: Secondary | ICD-10-CM | POA: Insufficient documentation

## 2013-10-10 DIAGNOSIS — E669 Obesity, unspecified: Secondary | ICD-10-CM | POA: Insufficient documentation

## 2013-10-10 DIAGNOSIS — R0609 Other forms of dyspnea: Secondary | ICD-10-CM | POA: Insufficient documentation

## 2013-10-10 DIAGNOSIS — R0989 Other specified symptoms and signs involving the circulatory and respiratory systems: Secondary | ICD-10-CM

## 2013-10-10 HISTORY — DX: Obstructive hypertrophic cardiomyopathy: I42.1

## 2013-10-10 HISTORY — DX: Obesity, unspecified: E66.9

## 2013-10-10 HISTORY — DX: Hyperlipidemia, unspecified: E78.5

## 2013-10-11 ENCOUNTER — Ambulatory Visit
Admission: RE | Admit: 2013-10-11 | Discharge: 2013-10-11 | Disposition: A | Discharge status: Home / Self Care | Payer: Federal, State, Local not specified - PPO | Source: Ambulatory Visit | Attending: Cardiology | Admitting: Cardiology

## 2013-10-11 ENCOUNTER — Ambulatory Visit (HOSPITAL_COMMUNITY)
Admission: RE | Admit: 2013-10-11 | Discharge: 2013-10-11 | Disposition: A | Discharge status: Home / Self Care | Payer: Federal, State, Local not specified - PPO | Source: Ambulatory Visit | Attending: Cardiology | Admitting: Cardiology

## 2013-10-11 ENCOUNTER — Encounter (HOSPITAL_COMMUNITY): Payer: Self-pay | Admitting: Emergency Medicine

## 2013-10-11 ENCOUNTER — Other Ambulatory Visit: Payer: Self-pay | Admitting: Cardiology

## 2013-10-11 ENCOUNTER — Other Ambulatory Visit (HOSPITAL_COMMUNITY): Payer: Self-pay | Admitting: Cardiology

## 2013-10-11 ENCOUNTER — Emergency Department (HOSPITAL_COMMUNITY)
Admission: EM | Admit: 2013-10-11 | Discharge: 2013-10-12 | Disposition: A | Discharge status: Home / Self Care | Payer: Federal, State, Local not specified - PPO | Attending: Emergency Medicine | Admitting: Emergency Medicine

## 2013-10-11 DIAGNOSIS — Z79899 Other long term (current) drug therapy: Secondary | ICD-10-CM | POA: Insufficient documentation

## 2013-10-11 DIAGNOSIS — R7989 Other specified abnormal findings of blood chemistry: Secondary | ICD-10-CM

## 2013-10-11 DIAGNOSIS — J029 Acute pharyngitis, unspecified: Secondary | ICD-10-CM | POA: Insufficient documentation

## 2013-10-11 DIAGNOSIS — R0989 Other specified symptoms and signs involving the circulatory and respiratory systems: Secondary | ICD-10-CM | POA: Insufficient documentation

## 2013-10-11 DIAGNOSIS — R21 Rash and other nonspecific skin eruption: Secondary | ICD-10-CM | POA: Insufficient documentation

## 2013-10-11 DIAGNOSIS — T7840XA Allergy, unspecified, initial encounter: Secondary | ICD-10-CM

## 2013-10-11 DIAGNOSIS — R609 Edema, unspecified: Secondary | ICD-10-CM

## 2013-10-11 DIAGNOSIS — Z8639 Personal history of other endocrine, nutritional and metabolic disease: Secondary | ICD-10-CM | POA: Insufficient documentation

## 2013-10-11 DIAGNOSIS — Z8679 Personal history of other diseases of the circulatory system: Secondary | ICD-10-CM | POA: Insufficient documentation

## 2013-10-11 DIAGNOSIS — Z8719 Personal history of other diseases of the digestive system: Secondary | ICD-10-CM | POA: Insufficient documentation

## 2013-10-11 DIAGNOSIS — IMO0002 Reserved for concepts with insufficient information to code with codable children: Secondary | ICD-10-CM | POA: Insufficient documentation

## 2013-10-11 DIAGNOSIS — R0609 Other forms of dyspnea: Secondary | ICD-10-CM

## 2013-10-11 DIAGNOSIS — L299 Pruritus, unspecified: Secondary | ICD-10-CM | POA: Insufficient documentation

## 2013-10-11 DIAGNOSIS — Z880 Allergy status to penicillin: Secondary | ICD-10-CM | POA: Insufficient documentation

## 2013-10-11 DIAGNOSIS — Z87442 Personal history of urinary calculi: Secondary | ICD-10-CM | POA: Insufficient documentation

## 2013-10-11 DIAGNOSIS — Z862 Personal history of diseases of the blood and blood-forming organs and certain disorders involving the immune mechanism: Secondary | ICD-10-CM | POA: Insufficient documentation

## 2013-10-11 DIAGNOSIS — T50995A Adverse effect of other drugs, medicaments and biological substances, initial encounter: Secondary | ICD-10-CM | POA: Insufficient documentation

## 2013-10-11 DIAGNOSIS — G8929 Other chronic pain: Secondary | ICD-10-CM | POA: Insufficient documentation

## 2013-10-11 DIAGNOSIS — E669 Obesity, unspecified: Secondary | ICD-10-CM | POA: Insufficient documentation

## 2013-10-11 DIAGNOSIS — L509 Urticaria, unspecified: Secondary | ICD-10-CM | POA: Insufficient documentation

## 2013-10-11 MED ORDER — FAMOTIDINE 20 MG PO TABS
20.0000 mg | ORAL_TABLET | Freq: Once | ORAL | Status: AC
  Administered 2013-10-11: 20 mg via ORAL
  Filled 2013-10-11: qty 1

## 2013-10-11 MED ORDER — TECHNETIUM TO 99M ALBUMIN AGGREGATED
6.0000 | Freq: Once | INTRAVENOUS | Status: AC | PRN
  Administered 2013-10-11: 6 via INTRAVENOUS

## 2013-10-11 MED ORDER — TECHNETIUM TC 99M DIETHYLENETRIAME-PENTAACETIC ACID: 40.0000 | Freq: Once | INTRAVENOUS | Status: DC | PRN

## 2013-10-11 MED ORDER — PREDNISONE 20 MG PO TABS
40.0000 mg | ORAL_TABLET | Freq: Once | ORAL | Status: AC
  Administered 2013-10-11: 40 mg via ORAL
  Filled 2013-10-11: qty 2

## 2013-10-11 MED ORDER — DIPHENHYDRAMINE HCL 25 MG PO CAPS
50.0000 mg | ORAL_CAPSULE | Freq: Once | ORAL | Status: AC
  Administered 2013-10-11: 50 mg via ORAL
  Filled 2013-10-11: qty 2

## 2013-10-11 NOTE — ED Notes (Signed)
Pt presents with c/o allergic reaction. Pt had a CT earlier today and had a reaction to the CT dye. Pt was given 50mg  benadryl at that time and was told to come to the ER if symptoms became worse. Pt c/o some hives on his chest and some itching in his throat. Pt took another 50mg  benadryl around 8:45pm tonight. Pt in NAD at this time.

## 2013-10-11 NOTE — ED Provider Notes (Signed)
CSN: [id number]     Arrival date & time 10/11/13  2151 History   First MD Initiated Contact with Patient 10/11/13 2246     Chief Complaint  Patient presents with  . Allergic Reaction   (Consider location/radiation/quality/duration/timing/severity/associated sxs/prior Treatment) HPI Comments: 57 year old male who presents to the hospital after having a CT scan earlier in the day for further evaluation of a possible pulmonary embolism. During the CT scan injection of contrast she developed diffuse itching and redness and flushing of his face and upper chest. They stopped the test immediately and gave the patient Benadryl which immediately helped. He was then sent for a VQ scan which he reports as negative for pulmonary embolism. This evening he developed a recurrence of redness flushing and hives across his face and upper chest as well as a feeling of swelling and itchiness in his throat especially on the left side. This is persistent, mild, improved with Benadryl at home however he was directed to the hospital from on call physician warned him of the consequences of allergic reaction while sleeping. This prompted his visit to the emergency department. He denies wheezing or shortness of breath, lightheadedness or headache. He denies any chest pain.  Patient is a 57 y.o. male presenting with allergic reaction. The history is provided by the patient.  Allergic Reaction Presenting symptoms: rash     Past Medical History  Diagnosis Date  . Calcium oxalate renal stones   . Chronic neck pain   . Hyperlipidemia 10/10/2013  . Hypertrophic obstructive cardiomyopathy(425.11) 10/10/2013  . Obesity (BMI 30-39.9) 10/10/2013  . IBS (irritable bowel syndrome) 12/07/2011   History reviewed. No pertinent past surgical history. No family history on file. History  Substance Use Topics  . Smoking status: Never Smoker   . Smokeless tobacco: Never Used  . Alcohol Use: 0.6 oz/week    1 Glasses of wine per week     Review of Systems  Constitutional: Negative for fever and chills.  HENT: Positive for sore throat. Negative for facial swelling and voice change.   Eyes: Negative for visual disturbance.  Respiratory: Negative for cough and shortness of breath.   Cardiovascular: Negative for chest pain.  Gastrointestinal: Negative for nausea, vomiting, abdominal pain and diarrhea.  Genitourinary: Negative for dysuria and frequency.  Musculoskeletal: Negative for back pain and neck pain.  Skin: Positive for rash.  Neurological: Negative for weakness, numbness and headaches.  Hematological: Negative for adenopathy.  Psychiatric/Behavioral: Negative for behavioral problems.    Allergies  Iodinated diagnostic agents; Erythromycin; Ibuprofen; Penicillins; and Sulfa antibiotics  Home Medications   Current Outpatient Rx  Name  Route  Sig  Dispense  Refill  . clonazePAM (KLONOPIN) 0.5 MG tablet   Oral   Take 1 tablet (0.5 mg total) by mouth 2 (two) times daily as needed.   30 tablet   0   . desonide (DESOWEN) 0.05 % cream   Topical   Apply 1 application topically 2 (two) times daily.          . diphenhydrAMINE (BENADRYL) 25 mg capsule   Oral   Take 50 mg by mouth every 6 (six) hours as needed.          . diphenhydramine-acetaminophen (TYLENOL PM) 25-500 MG TABS   Oral   Take 1 tablet by mouth at bedtime as needed.           . fluticasone (FLONASE) 50 MCG/ACT nasal spray   Nasal   Place 2 sprays into the nose daily.  16 g   3   . ketoconazole (NIZORAL) 2 % cream   Topical   Apply 1 application topically daily.          . naproxen (NAPROSYN) 500 MG tablet   Oral   Take 500 mg by mouth 2 (two) times daily as needed for moderate pain.         [name] oxycodone-acetaminophen (LYNOX) 10-300 MG per tablet   Oral   Take 1 tablet by mouth every 4 (four) hours as needed.           . valACYclovir (VALTREX) 500 MG tablet   Oral   Take 500 mg by mouth as needed.           .  Vardenafil HCl 10 MG TBDP   Oral   Take 10 mg by mouth daily.   10 tablet   2   . EPINEPHrine (EPIPEN 2-PAK) 0.3 mg/0.3 mL SOAJ injection   Intramuscular   Inject 0.3 mLs (0.3 mg total) into the muscle once as needed (for severe allergic reaction). CAll 911 immediately if you have to use this medicine   1 Device   1   . predniSONE (DELTASONE) 20 MG tablet   Oral   Take 2 tablets (40 mg total) by mouth daily.   10 tablet   0   . ranitidine (ZANTAC) 150 MG tablet   Oral   Take 1 tablet (150 mg total) by mouth 2 (two) times daily.   30 tablet   0    BP 152/92  Pulse 111  Temp(Src) 98.1 F (36.7 C) (Oral)  Resp 16  Ht 5\' 8"  (1.727 m)  Wt 215 lb (97.523 kg)  BMI 32.70 kg/m2  SpO2 99% Physical Exam  Nursing note and vitals reviewed. Constitutional: He appears well-developed and well-nourished. No distress.  HENT:  Head: Normocephalic and atraumatic.  Mouth/Throat: Oropharynx is clear and moist. No oropharyngeal exudate.  Slight asymmetry of the posterior pharynx, mild edema. Normal phonation, normal voice, clear oropharynx otherwise. Moist mucous membranes.  Eyes: Conjunctivae and EOM are normal. Pupils are equal, round, and reactive to light. Right eye exhibits no discharge. Left eye exhibits no discharge. No scleral icterus.  Neck: Normal range of motion. Neck supple. No JVD present. No thyromegaly present.  No lymphadenopathy, very supple neck  Cardiovascular: Normal rate, regular rhythm, normal heart sounds and intact distal pulses.  Exam reveals no gallop and no friction rub.   No murmur heard. Pulmonary/Chest: Effort normal and breath sounds normal. No respiratory distress. He has no wheezes. He has no rales.  Abdominal: Soft. Bowel sounds are normal. He exhibits no distension and no mass. There is no tenderness.  Musculoskeletal: Normal range of motion. He exhibits no edema and no tenderness.  Lymphadenopathy:    He has no cervical adenopathy.  Neurological: He is  alert. Coordination normal.  Skin: Skin is warm and dry. No rash noted. No erythema.  Psychiatric: He has a normal mood and affect. His behavior is normal.    ED Course  Procedures (including critical care time) Labs Review Labs Reviewed - No data to display Imaging Review Dg Chest 2 View  10/10/2013   CLINICAL DATA:  Dyspnea on exertion.  EXAM: CHEST  2 VIEW  COMPARISON:  None.  FINDINGS: The heart size and mediastinal contours are within normal limits. Both lungs are clear. The visualized skeletal structures are unremarkable.  IMPRESSION: No active cardiopulmonary disease.   Electronically Signed   By: Loraine Leriche  Gallerani M.D.   On: 10/10/2013 15:50   Nm Pulmonary Perf And Vent  10/11/2013   CLINICAL DATA:  Dyspnea on exertion, lower extremity edema  EXAM: NUCLEAR MEDICINE VENTILATION - PERFUSION LUNG SCAN  TECHNIQUE: Ventilation images were obtained in multiple projections using inhaled aerosol technetium 99 M DTPA. Perfusion images were obtained in multiple projections after intravenous injection of Tc-74m MAA.  COMPARISON:  Correlation with chest radiographs dated 10/10/2013  RADIOPHARMACEUTICALS:  6 mCi Tc-68m DTPA aerosol and 40 mCi Tc-39m MAA  FINDINGS: Ventilation: No focal ventilation defect.  Perfusion: No wedge shaped peripheral perfusion defects to suggest acute pulmonary embolism.  IMPRESSION: Negative VQ scan.  No evidence of pulmonary embolism.   Electronically Signed   By: Charline Bills M.D.   On: 10/11/2013 16:20    EKG Interpretation   None       MDM   1. Allergic reaction, initial encounter    The patient has no tachycardia on my exam, he has clear lungs without any wheezing, I have reviewed his VQ scan from earlier in the day and agreed that there was no sign of pulmonary embolism. He appears to have had an ongoing allergic reaction to the contrast material that was injected during the CT scan. This appears to be mild to moderate, she will need additional medications  to the Benadryl including Pepcid and prednisone with an observational period but this time it is not any epinephrine or any other aggressive measures.  12:50 AM, patient is asymptomatic, recheck of his oropharynx shows no edema or swelling, phonation remains normal, vital signs remained normal. I discussed at length with the patient the need for ongoing treatment over the next several days including the medications below. I will dispense an EpiPen prescription with the patient, he understands the indications for juice, will followup as needed.   Meds given in ED:  Medications  predniSONE (DELTASONE) tablet 40 mg (40 mg Oral Given 10/11/13 2317)  famotidine (PEPCID) tablet 20 mg (20 mg Oral Given 10/11/13 2317)  diphenhydrAMINE (BENADRYL) capsule 50 mg (50 mg Oral Given 10/11/13 2317)    New Prescriptions   EPINEPHRINE (EPIPEN 2-PAK) 0.3 MG/0.3 ML SOAJ INJECTION    Inject 0.3 mLs (0.3 mg total) into the muscle once as needed (for severe allergic reaction). CAll 911 immediately if you have to use this medicine   PREDNISONE (DELTASONE) 20 MG TABLET    Take 2 tablets (40 mg total) by mouth daily.   RANITIDINE (ZANTAC) 150 MG TABLET    Take 1 tablet (150 mg total) by mouth 2 (two) times daily.      Vida Roller, MD 10/12/13 0100

## 2013-10-11 NOTE — Progress Notes (Signed)
Patient presented for CTA scan of chest with IV contrast on 10/11/13. Patient was administered a 20 ml bolus of Omnipaque 350, at which time he immediately experienced nausea and itching around both eyes and around neck. Scan was stopped and patient was given 50 mg Diphenhydramine orally. Patient was assessed by Dr. Alfredo Batty and monitored for 30 minutes after the test. No other symptoms or problems were noted. Patient was cleared to leave and proceed to [facility] to receive a Nuclear Medicine VQ scan as an alternative test.  Iodinated contrast agents were added to the patient's allergy chart.

## 2013-10-12 ENCOUNTER — Inpatient Hospital Stay
Admission: RE | Admit: 2013-10-12 | Discharge: 2013-10-12 | Disposition: A | Discharge status: Home / Self Care | Payer: Federal, State, Local not specified - PPO | Source: Ambulatory Visit | Attending: Cardiology | Admitting: Cardiology

## 2013-10-12 MED ORDER — RANITIDINE HCL 150 MG PO TABS: 150.0000 mg | ORAL_TABLET | Freq: Two times a day (BID) | ORAL | Status: DC

## 2013-10-12 MED ORDER — EPINEPHRINE 0.3 MG/0.3ML IJ SOAJ: 0.3000 mg | Freq: Once | INTRAMUSCULAR | Status: DC | PRN

## 2013-10-12 MED ORDER — PREDNISONE 20 MG PO TABS: 40.0000 mg | ORAL_TABLET | Freq: Every day | ORAL | Status: DC

## 2013-10-17 ENCOUNTER — Encounter: Payer: Self-pay | Admitting: Cardiology

## 2013-10-17 NOTE — Progress Notes (Signed)
Patient ID: Edward Curtis, male   DOB: 1956/07/16, 57 y.o.   MRN: [id number]  Edward, Curtis    Date of visit:  10/17/2013 DOB:  08-20-1956    Age:  57 yrs. Medical record number:  [id number]     Account number:  [id number] Primary Care Provider: Sharlot Gowda C ____________________________ CURRENT DIAGNOSES  1. Cardiomyopathy Hypertrophic  2. Supraventricular Tachycardia  3. Hyperlipidemia-mixed disorder  4. Obesity(BMI30-40)  5. Hypertrophic cardiomyopathy ____________________________ ALLERGIES  Ibuprofen, Palpitations  Penicillins, Itching, non-specific  Sulfa (Sulfonamides), Intolerance-unknown  Trimethoprim, Itching, non-specific ____________________________ MEDICATIONS  1. verapamil 80 mg tablet, 2 tabs po at onset of tachycardia prn  2. Valtrex 500 mg tablet, PRN  3. clonazepam 0.5 mg Tablet, PRN  4. Flonase 50 mcg/Actuation Spray, Suspension, PRN  5. naproxen 500 mg Tablet, PRN  6. Tylenol Ex Str Arthritis Pain 500 mg Tablet, PRN  7. Tylenol PM 25-500 mg-mg/mL Solution, PRN  8. Benadryl 25 mg Capsule, PRN  9. Levitra 10 mg tablet, PRN  10. Percocet 5-325 mg tablet, PRN  11. ketoconazole 2 % topical cream, PRN  12. desonide 0.05 % topical cream, PRN  13. ranitidine 150 mg tablet, PRN  14. verapamil ER (SR) 180 mg tablet,extended release, 1 p.o. daily ____________________________ CHIEF COMPLAINTS  Followup of Cardiomyopathy-hypertrophic ____________________________ HISTORY OF PRESENT ILLNESS Patient returns for cardiac followup. Since he was previously here he was found to have a normal BNP but an elevated d-dimer. Because of the elevated d-dimer he had a set of lower extremity venous Dopplers that showed no evidence of DVT. He was sent for a CT angiogram chest but had itching after contrast and did not receive the full bolus. He later had a ventilation perfusion lung scan that showed no evidence of pulmonary emboli. He later went to the emergency room because he was worried about  the reaction to contrast and complained of some vague itching and was treated with prednisone for 5 days.  In the intervening time his edema has now gone away. His echocardiogram today shows moderate concentric LVH without evidence of outflow tract obstruction. He denies PND, orthopnea, or claudication. He is still concerned about the thigh lesion where he thought that he may have had a spider bite. ____________________________ PAST HISTORY  Past Medical Illnesses:  asthma, kidney stones, lumbar disc disease, anxiety;  Cardiovascular Illnesses:  cardiomyopathy(hypertrophic);  Surgical Procedures:  arthroscopic knee surgery, umbilical cyst;  Cardiology Procedures-Invasive:  no history of prior cardiac procedures;  Cardiology Procedures-Noninvasive:  echocardiogram, holter monitor, treadmill September 2008, echocardiogram October 2012, echocardiogram December 2014;  LVEF of 65% documented via echocardiogram on 10/17/2013,   ____________________________ CARDIO-PULMONARY TEST DATES EKG Date:  10/10/2013;  Holter/Event Monitor Date: 06/04/2010;  Echocardiography Date: 10/17/2013;  Chest Xray Date: 10/10/2013;   ____________________________ FAMILY HISTORY Brother -- Brother alive and well Father -- Father alive with problem, Coronary Artery Disease, Diabetes mellitus Mother -- Mother dead, Alzheimer's disease Sister -- Sister alive and well Sister -- Sister alive and well ____________________________ SOCIAL HISTORY Alcohol Use:  occasionally;  Smoking:  never smoked;  Diet:  regular diet;  Lifestyle:  single;  Exercise:  no regular exercise;  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 Weight:  216.00 lbs. Height:  68"BMI: 33  Constitutional:  pleasant white male in no acute distress, moderately obese Skin:  warm and dry to touch, no apparent skin lesions, or masses noted. Head:  normocephalic, normal hair pattern, no masses or tenderness Chest:  normal symmetry, clear to auscultation Cardiac:  regular rhythm, normal S1 and S2, No S3 or S4, no murmurs, gallops or rubs detected. Peripheral Pulses:  the femoral,dorsalis pedis, and posterior tibial pulses are full and equal bilaterally with no bruits auscultated. Extremities & Back:  no edema present Neurological:  no gross motor or sensory deficits noted, affect appropriate, oriented x3. ____________________________ MOST RECENT LIPID PANEL 08/04/11  CHOL TOTL 212 mg/dl, LDL 540 calc, HDL 24 mg/dl, TRIGLYCER 981 mg/dl and CHOL/HDL 8.7 (Calc) ____________________________ IMPRESSIONS/PLAN   1. Resolution of the previous edema. The edema is not due to congestive heart failure or to chronic venous insufficiency at this time. I would wonder whether the edema was either do to Naprosyn that he had been taking or possibly whether the lesion on his skin may have precipitated some sort of reaction. Anyway without much change in weight or clinical status the edema has resolved. 2. Hypertrophic cardiomyopathy without obstruction 3. Obesity with need to lose weight  Recommendations:  Long discussion about edema. At this point in time I don't think there is a cardiac cause for it. I would recommend that he take verapamil sustained release 180 mg daily to help promote ventricular relaxation in the presence of his hypertrophic cardiomyopathy. I will plan  to see him in followup in 6 months. ____________________________ TODAYS ORDERS  1. Return Visit: 6 months                       ____________________________ Cardiology Physician:  Darden Palmer MD [facility]

## 2013-10-17 NOTE — Progress Notes (Signed)
Patient ID: Edward Curtis, male   DOB: [birth year], 57 y.o.   MRN: [id number]   Edward, Curtis    Date of visit:  10/10/2013 DOB:  Jan 05, 1956    Age:  57 yrs. Medical record number:  [id number]     Account number:  [id number] Primary Care Provider: Sharlot Gowda C ____________________________ CURRENT DIAGNOSES  1. Edema  2. Dyspnea  3. Hyperlipidemia-mixed disorder  4. Obesity(BMI30-40)  5. Cardiomyopathy Hypertrophic  6. Supraventricular Tachycardia  7. Hypertrophic cardiomyopathy ____________________________ ALLERGIES  Ibuprofen, Palpitations  Penicillins, Itching, non-specific  Sulfa (Sulfonamides), Intolerance-unknown  Trimethoprim, Itching, non-specific ____________________________ MEDICATIONS  1. verapamil 80 mg tablet, 2 tabs po at onset of tachycardia prn  2. Valtrex 500 mg tablet, PRN  3. clonazepam 0.5 mg Tablet, PRN  4. Flonase 50 mcg/Actuation Spray, Suspension, PRN  5. naproxen 500 mg Tablet, PRN  6. Tylenol Ex Str Arthritis Pain 500 mg Tablet, PRN  7. Tylenol PM 25-500 mg-mg/mL Solution, PRN  8. Benadryl 25 mg Capsule, PRN  9. Levitra 10 mg tablet, PRN  10. Percocet 5-325 mg tablet, PRN  11. ketoconazole 2 % topical cream, PRN  12. desonide 0.05 % topical cream, PRN ____________________________ CHIEF COMPLAINTS  Ankle edema ____________________________ HISTORY OF PRESENT ILLNESS Patient seen after a two-year absence. He has developed edema over about the past month in brth lower extremities. He has been taking Naprosyn and had been sedentary prior to that. He notes the edema will be worse the gets up and walks on it during the day. He will occasionally wake up with nausea at night and has had some neck pain and complains of some pain involving his left arm. He has no significant angina. He has no PND orthopnea. He does note some dyspnea with exertion. Since he was previously here he has gained about 30 pounds of weight. ____________________________ PAST HISTORY  Past  Medical Illnesses:  asthma, kidney stones, lumbar disc disease, anxiety;  Cardiovascular Illnesses:  cardiomyopathy(hypertrophic);  Surgical Procedures:  arthroscopic knee surgery, umbilical cyst;  Cardiology Procedures-Invasive:  no history of prior cardiac procedures;  Cardiology Procedures-Noninvasive:  echocardiogram, holter monitor, treadmill September 2008, echocardiogram October 2012;  LVEF of 65% documented via echocardiogram on 08/04/2011,   ____________________________ CARDIO-PULMONARY TEST DATES EKG Date:  10/10/2013;  Holter/Event Monitor Date: 06/04/2010;  Echocardiography Date: 08/04/2011;   ____________________________ FAMILY HISTORY Brother -- Brother alive and well Father -- Father alive with problem, Coronary Artery Disease, Diabetes mellitus Mother -- Mother dead, Alzheimer's disease Sister -- Sister alive and well Sister -- Sister alive and well ____________________________ SOCIAL HISTORY Alcohol Use:  occasionally;  Smoking:  never smoked;  Diet:  regular diet;  Lifestyle:  single;  Exercise:  no regular exercise;  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;  Residence:  lives alone;   ____________________________ REVIEW OF SYSTEMS General:  weight gain of approximately 30 lbs Eyes: wears eye glasses/contact lenses Respiratory: history of asthma Cardiovascular:  please review HPI Abdominal: denies dyspepsia, GI bleeding, constipation, or diarrhea Genitourinary-Male: no dysuria, urgency, frequency, or nocturia  Musculoskeletal:  chronic low back pain, has had cortisone injections, cervical spondylosis  ____________________________ PHYSICAL EXAMINATION VITAL SIGNS  Blood Pressure:  114/70 Sitting, Right arm, regular cuff  , 110/70 Standing, Right arm and regular cuff   Pulse:  100/min. Weight:  218.00 lbs. Height:  68"BMI: 33  Constitutional:  pleasant white male in no acute distress, moderately obese Skin:  warm and dry to touch, no apparent skin lesions, or  masses noted.  Head:  normocephalic, normal hair pattern, no masses or tenderness Eyes:  EOMS Intact, PERRLA, C and S clear, Funduscopic exam not done. ENT:  ears, nose and throat reveal no gross abnormalities.  Dentition good. Neck:  supple, without massess. No JVD, thyromegaly or carotid bruits. Carotid upstroke normal. Chest:  normal symmetry, clear to auscultation and percussion. Cardiac:  regular rhythm, normal S1 and S2, No S3 or S4, no murmurs, gallops or rubs detected. Abdomen:  abdomen soft,non-tender, no masses, no hepatospenomegaly, or aneurysm noted Peripheral Pulses:  the femoral,dorsalis pedis, and posterior tibial pulses are full and equal bilaterally with no bruits auscultated. Extremities & Back:  2+ edema, no Homan's sign or tenderness Neurological:  no gross motor or sensory deficits noted, affect appropriate, oriented x3. ____________________________ MOST RECENT LIPID PANEL 08/04/11  CHOL TOTL 212 mg/dl, LDL 161 calc, HDL 24 mg/dl, TRIGLYCER 096 mg/dl and CHOL/HDL 8.7 (Calc) ____________________________ IMPRESSIONS/PLAN  1. New-onset of bilateral pedal edema. He also has right axis deviation and what appears to be right ventricular hypertrophy on his EKG now. I think it would be important to be sure he is not had a pulmonary embolus or DVT and I have asked him to have lower extremity venous Dopplers, a d-dimer, chest x-ray, and BNP. I would like for him to have an echocardiogram to evaluate his wall thicknesses he does have known hypertrophic cardiomyopathy.   2. Prior history of hypertrophic cardiomyopathy 3. Obesity with weight gain since here 4. History of supraventricular tachycardia ____________________________ TODAYS ORDERS  1. 12 Lead EKG: Today  2. 2D, color flow, doppler: First Available  3. BNP: Today  4. D-dimer: today  5. Bil L.E. Venous Doppler DVT: First Available  6. Chest X-ray PA/Lat: today  7. Return Visit: 1 week  8. Urinalysis: Today                        ____________________________ Cardiology Physician:  Darden Palmer MD [facility]

## 2013-11-01 ENCOUNTER — Encounter: Payer: Self-pay | Admitting: Family Medicine

## 2013-11-12 ENCOUNTER — Telehealth: Payer: Self-pay

## 2013-11-12 ENCOUNTER — Encounter: Payer: Self-pay | Admitting: Family Medicine

## 2013-11-12 ENCOUNTER — Ambulatory Visit (INDEPENDENT_AMBULATORY_CARE_PROVIDER_SITE_OTHER): Payer: Federal, State, Local not specified - PPO | Admitting: Family Medicine

## 2013-11-12 VITALS — BP 110/74 | Temp 99.6°F | Ht 68.0 in | Wt 217.0 lb

## 2013-11-12 DIAGNOSIS — Z7251 High risk heterosexual behavior: Secondary | ICD-10-CM

## 2013-11-12 DIAGNOSIS — Z7689 Persons encountering health services in other specified circumstances: Secondary | ICD-10-CM

## 2013-11-12 DIAGNOSIS — F419 Anxiety disorder, unspecified: Secondary | ICD-10-CM

## 2013-11-12 DIAGNOSIS — F341 Dysthymic disorder: Secondary | ICD-10-CM

## 2013-11-12 DIAGNOSIS — R7303 Prediabetes: Secondary | ICD-10-CM

## 2013-11-12 DIAGNOSIS — Z7189 Other specified counseling: Secondary | ICD-10-CM

## 2013-11-12 DIAGNOSIS — F32A Depression, unspecified: Secondary | ICD-10-CM

## 2013-11-12 DIAGNOSIS — R7309 Other abnormal glucose: Secondary | ICD-10-CM

## 2013-11-12 DIAGNOSIS — F329 Major depressive disorder, single episode, unspecified: Secondary | ICD-10-CM

## 2013-11-12 NOTE — Telephone Encounter (Signed)
Called and spoke with pt and pt states he is thinking about changing PCP offices.  Called and made pt aware that Dr. Selena BattenKim does not use prescribe pain medication long term.  Pt states he takes the Klonipin prn for anxiety.  Pt states his last PCP gave him a 30 day supply and it expired because he did not use it that often.  Pt states the pain medicaiton is prescribed by Dr. Christin BachAlbert Barko [facility] for back and neck pain.  Pt will arrive at 2:15 pm for appointment at 2:30.

## 2013-11-12 NOTE — Telephone Encounter (Signed)
Per Dr. Elmyra RicksKim's request called pt to get more information about upcoming appointment today.  Per Dr. Selena BattenKim pt has a PCP and was seen recently 09/2013.  Also called to advise pt that Dr. Selena BattenKim does not prescribe pain medications long term.  Left a message for pt to return call.

## 2013-11-12 NOTE — Patient Instructions (Signed)
-  We have ordered labs or studies at this visit. It can take up to 1-2 weeks for results and processing. We will contact you with instructions IF your results are abnormal. Normal results will be released to your MYCHART. If you have not heard from us or can not find your results in MYCHART in 2 weeks please contact our office.  -PLEASE SIGN UP FOR MYCHART TODAY   We recommend the following healthy lifestyle measures: - eat a healthy diet consisting of lots of vegetables, fruits, beans, nuts, seeds, healthy meats such as white chicken and fish and whole grains.  - avoid fried foods, fast food, processed foods, sodas, red meet and other fattening foods.  - get a least 150 minutes of aerobic exercise per week.   Follow up in: 1 month  

## 2013-11-12 NOTE — Progress Notes (Signed)
Chief Complaint  Patient presents with  . Establish Care    HPI:  Edward Curtis is here to establish care. Was not happy with prior PCP.  Has the following chronic problems and concerns today:  Wants HIV testing: -has had several new partners over the last few years - all male -wants to do RPR and hep panel too just to be sure -had yeast infection in oct - tx by his dermatologist -had spider bite he thinks 2 months ago on thigh and wonders if there is testing for spider toxicity - his doctor told him follicular inf, now healing bu still has scar there and he is worried about it -has had few episodes of feeling flushed last month -had sinus drainage and cough for almost 2 months but then resolved recently -had swelling in leg, now resolved with tx by his cardiologist for cardiomyopathy -has dry mouth at times, takes benadryl daily  Anxiety and depression: -used to see psychologist -mood has been ok, mild depression, anxiety still present, no longer take medication for this  Chronic pain: -Dr. Murray Hodgkins q 3 months   Patient Active Problem List   Diagnosis Date Noted  . Anxiety and depression 11/12/2013  . High risk sexual behavior 11/12/2013  . Prediabetes 11/12/2013  . Hypertrophic obstructive cardiomyopathy(425.11) - followed by cardiologist 10/10/2013  . Obesity (BMI 30-39.9) 10/10/2013  . Hyperlipidemia 10/10/2013  . IBS (irritable bowel syndrome) 12/07/2011  . Tachycardia   . Chronic back pain    Health Maintenance:  ROS: See pertinent positives and negatives per HPI.  Past Medical History  Diagnosis Date  . Chronic neck pain   . Hyperlipidemia 10/10/2013  . Hypertrophic obstructive cardiomyopathy(425.11) 10/10/2013  . Obesity (BMI 30-39.9) 10/10/2013  . IBS (irritable bowel syndrome) 12/07/2011  . Asthma   . Arthritis   . Chicken pox   . Depression   . Fainting spell   . Frequent headaches   . GERD (gastroesophageal reflux disease)   . Seasonal allergies   .  Heart disease   . Migraines   . History of blood transfusion   . Cardiomyopathy   . Urine incontinence   . UTI (urinary tract infection)   . Calcium oxalate renal stones   . Kidney stones   . Calcium oxalate renal stones     Family History  Problem Relation Age of Onset  . Arthritis Mother   . Arthritis Paternal Grandmother   . Colon cancer Paternal Grandfather   . Hyperlipidemia Mother   . Hyperlipidemia Maternal Grandmother   . Heart disease Mother   . Heart disease Father   . Heart disease Paternal Grandfather   . Hypertension Father   . Hypertension Mother   . Hypertension      both sets of parents  . Diabetes Father   . Diabetes Paternal Grandmother   . Diabetes Paternal Grandfather     History   Social History  . Marital Status: Single    Spouse Name: N/A    Number of Children: N/A  . Years of Education: N/A   Social History Main Topics  . Smoking status: Never Smoker   . Smokeless tobacco: Never Used  . Alcohol Use: 0.6 oz/week    1 Glasses of wine per week  . Drug Use: No  . Sexual Activity: Not Currently   Other Topics Concern  . None   Social History Narrative  . None    Current outpatient prescriptions:desonide (DESOWEN) 0.05 % cream, Apply 1 application topically 2 (  two) times daily. , Disp: , Rfl: ;  diphenhydrAMINE (BENADRYL) 25 mg capsule, Take 50 mg by mouth every 6 (six) hours as needed. , Disp: , Rfl:  EPINEPHrine (EPIPEN 2-PAK) 0.3 mg/0.3 mL SOAJ injection, Inject 0.3 mLs (0.3 mg total) into the muscle once as needed (for severe allergic reaction). CAll 911 immediately if you have to use this medicine, Disp: 1 Device, Rfl: 1;  fluticasone (FLONASE) 50 MCG/ACT nasal spray, Place 2 sprays into the nose daily., Disp: 16 g, Rfl: 3;  ketoconazole (NIZORAL) 2 % cream, Apply 1 application topically daily. , Disp: , Rfl:  Lansoprazole (PREVACID PO), Take by mouth daily., Disp: , Rfl: ;  valACYclovir (VALTREX) 500 MG tablet, Take 500 mg by mouth as  needed.  , Disp: , Rfl: ;  Vardenafil HCl 10 MG TBDP, Take 10 mg by mouth daily., Disp: 10 tablet, Rfl: 2;  verapamil (COVERA HS) 180 MG (CO) 24 hr tablet, Take 180 mg by mouth at bedtime., Disp: , Rfl:  clonazePAM (KLONOPIN) 0.5 MG tablet, Take 1 tablet (0.5 mg total) by mouth 2 (two) times daily as needed., Disp: 30 tablet, Rfl: 0;  oxycodone-acetaminophen (LYNOX) 10-300 MG per tablet, Take 1 tablet by mouth every 4 (four) hours as needed.  , Disp: , Rfl:   EXAM:  Filed Vitals:   11/12/13 1433  BP: 110/74  Temp: 99.6 F (37.6 C)    Body mass index is 33 kg/(m^2).  GENERAL: vitals reviewed and listed above, alert, oriented, appears well hydrated and in no acute distress  HEENT: atraumatic, conjunttiva clear, no obvious abnormalities on inspection of external nose and ears  NECK: no obvious masses on inspection  LUNGS: clear to auscultation bilaterally, no wheezes, rales or rhonchi, good air movement  CV: HRRR, no peripheral edema  MS: moves all extremities without noticeable abnormality  PSYCH: pleasant and cooperative, no obvious depression or anxiety  ASSESSMENT AND PLAN:  Discussed the following assessment and plan:  Encounter to establish care -this is an anxious patient with many vague complaints - most of them resolved today who was unhappy with his last physician for a number of years as reports he felt was not attentive to his complaints -advised him to follow up with his dermatologist regarding his skin issues and advised unlikely that spider bite caused any of his symptoms -checking STI labs given his high risk sexual behavior and concerns -most of his symptoms - flushing, sinus issues, dry mouth, yeast infection, edema have been intermittent, were likely not related and have resolved -we did not have time to complete his history and social histroy review as he was fixated on the many complaints listed in HPI and gave lengthy detailed reports of these - mainly the skin  issues  -reviewed labs done recently which looked good other then prediabetes and mild LDL elevation and elevated lipids. -had extensive work up with cards due to edema and now on verapamil with resolution of edema  High risk sexual behavior - Plan: HIV antibody, RPR, Hep C Antibody, Hepatitis B surface antigen, Hepatitis B Core AB, Total  Anxiety and depression -advised he see psychiatrist as feel anxiety is significant; advised I do not rx benzos chronically in adults as feel rarely warranted unless severe pathology which should be appropriately managed by psychiatrist   Prediabetes -lifestyle recs  -We reviewed the PMH, PSH, FH, SH, Meds and Allergies. -We provided refills for any medications we will prescribe as needed. -We addressed current concerns per orders and patient  instructions. -We have asked for records for pertinent exams, studies, vaccines and notes from previous providers. -We have advised patient to follow up per instructions below.  Follow up in 1 month   -Patient advised to return or notify a doctor immediately if symptoms worsen or persist or new concerns arise.  Patient Instructions  -We have ordered labs or studies at this visit. It can take up to 1-2 weeks for results and processing. We will contact you with instructions IF your results are abnormal. Normal results will be released to your [facility]. If you have not heard from [place] or can not find your results in [facility] in 2 weeks please contact our office.  -PLEASE SIGN UP FOR MYCHART TODAY   We recommend the following healthy lifestyle measures: - eat a healthy diet consisting of lots of vegetables, fruits, beans, nuts, seeds, healthy meats such as white chicken and fish and whole grains.  - avoid fried foods, fast food, processed foods, sodas, red meet and other fattening foods.  - get a least 150 minutes of aerobic exercise per week.   Follow up in: 1 month      Emett Stapel R.

## 2013-11-13 LAB — HEPATITIS B CORE ANTIBODY, TOTAL: HEP B C TOTAL AB: NONREACTIVE

## 2013-11-13 LAB — RPR

## 2013-11-13 LAB — HEPATITIS C ANTIBODY: HCV Ab: NEGATIVE

## 2013-11-13 LAB — HIV ANTIBODY (ROUTINE TESTING W REFLEX): HIV: NONREACTIVE

## 2013-11-13 LAB — HEPATITIS B SURFACE ANTIGEN: Hepatitis B Surface Ag: NEGATIVE

## 2013-11-16 ENCOUNTER — Other Ambulatory Visit: Payer: Self-pay | Admitting: Medical

## 2013-12-13 ENCOUNTER — Ambulatory Visit: Payer: Federal, State, Local not specified - PPO | Admitting: Family Medicine

## 2013-12-20 ENCOUNTER — Ambulatory Visit: Payer: Federal, State, Local not specified - PPO | Admitting: Family Medicine

## 2013-12-27 ENCOUNTER — Encounter: Payer: Self-pay | Admitting: Family Medicine

## 2013-12-27 ENCOUNTER — Ambulatory Visit (INDEPENDENT_AMBULATORY_CARE_PROVIDER_SITE_OTHER): Payer: Federal, State, Local not specified - PPO | Admitting: Family Medicine

## 2013-12-27 VITALS — BP 122/70 | Temp 98.5°F | Wt 217.0 lb

## 2013-12-27 DIAGNOSIS — Z23 Encounter for immunization: Secondary | ICD-10-CM

## 2013-12-27 DIAGNOSIS — F329 Major depressive disorder, single episode, unspecified: Secondary | ICD-10-CM

## 2013-12-27 DIAGNOSIS — G8929 Other chronic pain: Secondary | ICD-10-CM

## 2013-12-27 DIAGNOSIS — M549 Dorsalgia, unspecified: Secondary | ICD-10-CM

## 2013-12-27 DIAGNOSIS — E669 Obesity, unspecified: Secondary | ICD-10-CM

## 2013-12-27 DIAGNOSIS — F32A Depression, unspecified: Secondary | ICD-10-CM

## 2013-12-27 DIAGNOSIS — F341 Dysthymic disorder: Secondary | ICD-10-CM

## 2013-12-27 DIAGNOSIS — F419 Anxiety disorder, unspecified: Secondary | ICD-10-CM

## 2013-12-27 DIAGNOSIS — Z0189 Encounter for other specified special examinations: Secondary | ICD-10-CM

## 2013-12-27 DIAGNOSIS — I421 Obstructive hypertrophic cardiomyopathy: Secondary | ICD-10-CM

## 2013-12-27 NOTE — Progress Notes (Signed)
Chief Complaint  Patient presents with  . Follow-up    HPI:  Follow up:  Lab work: -from last visit all normal  Anxiety and depression: -had advised he see psych last visit  Chronic pain: -followed by Dr. Murray Hodgkins  Obesity: -started diet last week and started walking -he is feeling great  Need to review hx and soc hx  ROS: See pertinent positives and negatives per HPI.  Past Medical History  Diagnosis Date  . Chronic neck pain   . Hyperlipidemia 10/10/2013  . Hypertrophic obstructive cardiomyopathy(425.11) 10/10/2013  . Obesity (BMI 30-39.9) 10/10/2013  . IBS (irritable bowel syndrome) 12/07/2011  . Asthma   . Arthritis   . Chicken pox   . Depression   . Fainting spell   . Frequent headaches   . GERD (gastroesophageal reflux disease)   . Seasonal allergies   . Heart disease   . Migraines   . History of blood transfusion   . Cardiomyopathy   . Urine incontinence   . UTI (urinary tract infection)   . Calcium oxalate renal stones   . Kidney stones   . Calcium oxalate renal stones     Past Surgical History  Procedure Laterality Date  . Umbilical cyst  1985  . Left knee surgery  1990  . Pre cancerous mole  2013  . Prostate procedure  1980  . Mouth surgery  1975/1993    Family History  Problem Relation Age of Onset  . Arthritis Mother   . Arthritis Paternal Grandmother   . Colon cancer Paternal Grandfather   . Hyperlipidemia Mother   . Hyperlipidemia Maternal Grandmother   . Heart disease Mother   . Heart disease Father   . Heart disease Paternal Grandfather   . Hypertension Father   . Hypertension Mother   . Hypertension      both sets of parents  . Diabetes Father   . Diabetes Paternal Grandmother   . Diabetes Paternal Grandfather     History   Social History  . Marital Status: Single    Spouse Name: N/A    Number of Children: N/A  . Years of Education: N/A   Social History Main Topics  . Smoking status: Never Smoker   . Smokeless  tobacco: Never Used  . Alcohol Use: 0.6 oz/week    1 Glasses of wine per week  . Drug Use: No  . Sexual Activity: Not Currently   Other Topics Concern  . None   Social History Narrative  . None    Current outpatient prescriptions:clonazePAM (KLONOPIN) 0.5 MG tablet, Take 1 tablet (0.5 mg total) by mouth 2 (two) times daily as needed., Disp: 30 tablet, Rfl: 0;  desonide (DESOWEN) 0.05 % cream, Apply 1 application topically 2 (two) times daily. , Disp: , Rfl: ;  diphenhydrAMINE (BENADRYL) 25 mg capsule, Take 50 mg by mouth every 6 (six) hours as needed. , Disp: , Rfl:  EPINEPHrine (EPIPEN 2-PAK) 0.3 mg/0.3 mL SOAJ injection, Inject 0.3 mLs (0.3 mg total) into the muscle once as needed (for severe allergic reaction). CAll 911 immediately if you have to use this medicine, Disp: 1 Device, Rfl: 1;  fluticasone (FLONASE) 50 MCG/ACT nasal spray, TWO SPRAYS DAILY IN NOSE, Disp: 16 g, Rfl: 2;  ketoconazole (NIZORAL) 2 % cream, Apply 1 application topically daily. , Disp: , Rfl:  Lansoprazole (PREVACID PO), Take by mouth daily., Disp: , Rfl: ;  oxycodone-acetaminophen (LYNOX) 10-300 MG per tablet, Take 1 tablet by mouth every 4 (four)  hours as needed.  , Disp: , Rfl: ;  valACYclovir (VALTREX) 500 MG tablet, Take 500 mg by mouth as needed.  , Disp: , Rfl: ;  Vardenafil HCl 10 MG TBDP, Take 10 mg by mouth daily., Disp: 10 tablet, Rfl: 2 verapamil (COVERA HS) 180 MG (CO) 24 hr tablet, Take 180 mg by mouth at bedtime., Disp: , Rfl:   EXAM:  Filed Vitals:   12/27/13 0919  BP: 122/70  Temp: 98.5 F (36.9 C)    Body mass index is 33 kg/(m^2).  GENERAL: vitals reviewed and listed above, alert, oriented, appears well hydrated and in no acute distress  HEENT: atraumatic, conjunttiva clear, no obvious abnormalities on inspection of external nose and ears  NECK: no obvious masses on inspection  LUNGS: clear to auscultation bilaterally, no wheezes, rales or rhonchi, good air movement  CV: HRRR, no  peripheral edema  MS: moves all extremities without noticeable abnormality  PSYCH: pleasant and cooperative, no obvious depression or anxiety  ASSESSMENT AND PLAN:  Discussed the following assessment and plan:  Obesity (BMI 30-39.9)  Laboratory test  Chronic back pain  Anxiety and depression  Hypertrophic obstructive cardiomyopathy(425.11) - followed by cardiologist  -discussed lifestyle changes at length and congratulated on changes -follow up in 4-6 months and as needed -Patient advised to return or notify a doctor immediately if symptoms worsen or persist or new concerns arise.  There are no Patient Instructions on file for this visit.   Kriste BasqueKIM, Macari Zalesky R.

## 2013-12-27 NOTE — Progress Notes (Signed)
Pre visit review using our clinic review tool, if applicable. No additional management support is needed unless otherwise documented below in the visit note. 

## 2013-12-27 NOTE — Addendum Note (Signed)
Addended by: Azucena FreedMILLNER, Thora Scherman C on: 12/27/2013 09:52 AM   Modules accepted: Orders

## 2014-01-02 ENCOUNTER — Ambulatory Visit: Payer: Federal, State, Local not specified - PPO | Admitting: Family Medicine

## 2014-03-26 ENCOUNTER — Encounter: Payer: Self-pay | Admitting: Cardiology

## 2014-03-26 NOTE — Progress Notes (Signed)
Patient ID: Edward Curtis, male   DOB: 07-18-1956, 58 y.o.   MRN: [id number]    Edward Curtis    Date of visit:  03/26/2014 DOB:  1956-05-23    Age:  58 yrs. Medical record number:  [id number]     Account number:  [id number] Primary Care Provider: Sharlot Curtis C ____________________________ CURRENT DIAGNOSES  1. Cardiomyopathy Hypertrophic  2. Supraventricular Tachycardia  3. Hyperlipidemia-mixed disorder  4. Obesity(BMI30-40) ____________________________ ALLERGIES  Ibuprofen, Palpitations  Penicillins, Itching, non-specific  Sulfa (Sulfonamides), Intolerance-unknown  Trimethoprim, Itching, non-specific ____________________________ MEDICATIONS  1. Valtrex 500 mg tablet, PRN  2. clonazepam 0.5 mg Tablet, PRN  3. Flonase 50 mcg/Actuation Spray, Suspension, PRN  4. Tylenol Ex Str Arthritis Pain 500 mg Tablet, PRN  5. Tylenol PM 25-500 mg-mg/mL Solution, PRN  6. Benadryl 25 mg Capsule, PRN  7. Levitra 10 mg tablet, PRN  8. Percocet 5-325 mg tablet, PRN  9. ketoconazole 2 % topical cream, PRN  10. desonide 0.05 % topical cream, PRN  11. verapamil ER (SR) 180 mg tablet,extended release, 1 p.o. daily  12. verapamil 80 mg tablet, 2 tabs  prn ____________________________ CHIEF COMPLAINTS  Followup of Cardiomyopathy-hypertrophic ____________________________ HISTORY OF PRESENT ILLNESS  Patient seen for cardiac followup. He has been feeling better on the verapamil but has not been able to lose significant amounts of weight. He has occasional episodes where he feels his heart pounding. He describes an episode a week ago where his back popped and then he had some chest pain and maybe had some palpitations but states that his back popped back that this all got better. He really feels fine at the present time. He has known hypertrophic cardiomyopathy without a significant outflow gradient. He has not had syncope. He remains obese. He denies PND orthopnea or edema ____________________________ PAST  HISTORY  Past Medical Illnesses:  asthma, kidney stones, lumbar disc disease, anxiety;  Cardiovascular Illnesses:  cardiomyopathy(hypertrophic);  Surgical Procedures:  arthroscopic knee surgery, umbilical cyst;  Cardiology Procedures-Invasive:  no history of prior cardiac procedures;  Cardiology Procedures-Noninvasive:  echocardiogram, holter monitor, treadmill September 2008, echocardiogram October 2012, echocardiogram December 2014;  LVEF of 65% documented via echocardiogram on 10/17/2013,   ____________________________ CARDIO-PULMONARY TEST DATES EKG Date:  10/10/2013;  Holter/Event Monitor Date: 06/04/2010;  Echocardiography Date: 10/17/2013;  Chest Xray Date: 10/10/2013;   ____________________________ FAMILY HISTORY Brother -- Brother alive and well Father -- Father alive with problem, Coronary Artery Disease, Diabetes mellitus Mother -- Mother dead, Alzheimer's disease Sister -- Sister alive and well Sister -- Sister alive and well ____________________________ SOCIAL HISTORY Alcohol Use:  occasionally;  Smoking:  never smoked;  Diet:  regular diet;  Lifestyle:  single;  Exercise:  no regular exercise;  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;  Residence:  lives alone;   ____________________________ REVIEW OF SYSTEMS General:  weight gain of approximately 5 lbs Eyes: wears eye glasses/contact lenses Respiratory: history of asthma Cardiovascular:  please review HPI Abdominal: denies dyspepsia, GI bleeding, constipation, or diarrhea Genitourinary-Male: frequency  Musculoskeletal:  chronic low back pain, has had cortisone injections, cervical spondylosis  ____________________________ PHYSICAL EXAMINATION VITAL SIGNS  Blood Pressure:  114/70 Sitting, Left arm, regular cuff  , 110/68 Standing, Left arm and regular cuff   Pulse:  100/min. Weight:  218.00 lbs. Height:  68"BMI: 33  Constitutional:  pleasant white male in no acute distress, moderately obese Skin:  warm and dry to  touch, no apparent skin lesions, or masses noted. Head:  normocephalic, normal  hair pattern, no masses or tenderness Chest:  normal symmetry, clear to auscultation Cardiac:  regular rhythm, normal S1 and S2, No S3 or S4, no murmurs, gallops or rubs detected. Peripheral Pulses:  the femoral,dorsalis pedis, and posterior tibial pulses are full and equal bilaterally with no bruits auscultated. Extremities & Back:  no edema present Neurological:  no gross motor or sensory deficits noted, affect appropriate, oriented x3. ____________________________ MOST RECENT LIPID PANEL 10/01/13  CHOL TOTL 219 mg/dl, LDL 161126 NM, HDL 28 mg/dl, TRIGLYCER 096324 mg/dl,  CHOL/HDL 7.8 (Calc)  ____________________________ IMPRESSIONS/PLAN 1. Hypertrophic nonobstructive cardiomyopathy 2. History of supraventricular tachycardia 3. Obesity with need to lose weight  Recommendations:  Clinically he appears improved. I will see him in followup in 6 months. ____________________________ TODAYS ORDERS  1. Return Visit: 6 months  2. 12 Lead EKG: 6 months                       ____________________________ Cardiology Physician:  Darden PalmerW. Spencer Jamela Cumbo, Jr. MD [facility]

## 2014-07-03 ENCOUNTER — Ambulatory Visit (INDEPENDENT_AMBULATORY_CARE_PROVIDER_SITE_OTHER): Payer: Federal, State, Local not specified - PPO | Admitting: Family Medicine

## 2014-07-03 ENCOUNTER — Encounter: Payer: Self-pay | Admitting: Family Medicine

## 2014-07-03 VITALS — BP 100/80 | HR 94 | Temp 98.6°F | Ht 68.0 in | Wt 204.0 lb

## 2014-07-03 DIAGNOSIS — W57XXXA Bitten or stung by nonvenomous insect and other nonvenomous arthropods, initial encounter: Secondary | ICD-10-CM

## 2014-07-03 DIAGNOSIS — Z23 Encounter for immunization: Secondary | ICD-10-CM

## 2014-07-03 DIAGNOSIS — T148 Other injury of unspecified body region: Secondary | ICD-10-CM

## 2014-07-03 MED ORDER — TRIAMCINOLONE ACETONIDE 0.1 % EX CREA: 1.0000 "application " | TOPICAL_CREAM | Freq: Two times a day (BID) | CUTANEOUS | Status: DC

## 2014-07-03 NOTE — Progress Notes (Signed)
No chief complaint on file.   HPI:  Acute visit for:  1) "? Poison Ivy" -started after working in the yard with flip flops in poison ivy -itchy rash on ankles bilat and a few on arm -has 2 indoor cats -denies: fevers, malaise, SOB, DOE ROS: See pertinent positives and negatives per HPI.  Past Medical History  Diagnosis Date  . Chronic neck pain   . Hyperlipidemia 10/10/2013  . Hypertrophic obstructive cardiomyopathy(425.11) 10/10/2013  . Obesity (BMI 30-39.9) 10/10/2013  . IBS (irritable bowel syndrome) 12/07/2011  . Asthma   . Arthritis   . Chicken pox   . Depression   . GERD (gastroesophageal reflux disease)   . Seasonal allergies   . Migraines   . Calcium oxalate renal stones     Past Surgical History  Procedure Laterality Date  . Umbilical cyst  1985  . Left knee surgery  1990  . Pre cancerous mole  2013  . Prostate procedure  1980  . Mouth surgery  1975/1993    Family History  Problem Relation Age of Onset  . Arthritis Mother   . Arthritis Paternal Grandmother   . Colon cancer Paternal Grandfather   . Hyperlipidemia Mother   . Hyperlipidemia Maternal Grandmother   . Heart disease Mother   . Heart disease Father   . Heart disease Paternal Grandfather   . Hypertension Father   . Hypertension Mother   . Hypertension      both sets of parents  . Diabetes Father   . Diabetes Paternal Grandmother   . Diabetes Paternal Grandfather     History   Social History  . Marital Status: Single    Spouse Name: N/A    Number of Children: N/A  . Years of Education: N/A   Social History Main Topics  . Smoking status: Never Smoker   . Smokeless tobacco: Never Used  . Alcohol Use: 0.6 oz/week    1 Glasses of wine per week  . Drug Use: No  . Sexual Activity: Not Currently   Other Topics Concern  . None   Social History Narrative  . None    Current outpatient prescriptions:diphenhydrAMINE (BENADRYL) 25 mg capsule, Take 50 mg by mouth every 6 (six) hours  as needed. , Disp: , Rfl: ;  EPINEPHrine (EPIPEN 2-PAK) 0.3 mg/0.3 mL SOAJ injection, Inject 0.3 mLs (0.3 mg total) into the muscle once as needed (for severe allergic reaction). CAll 911 immediately if you have to use this medicine, Disp: 1 Device, Rfl: 1 fluticasone (FLONASE) 50 MCG/ACT nasal spray, TWO SPRAYS DAILY IN NOSE, Disp: 16 g, Rfl: 2;  Lansoprazole (PREVACID PO), Take by mouth daily., Disp: , Rfl: ;  oxycodone-acetaminophen (LYNOX) 10-300 MG per tablet, Take 1 tablet by mouth every 4 (four) hours as needed.  , Disp: , Rfl: ;  valACYclovir (VALTREX) 500 MG tablet, Take 500 mg by mouth as needed.  , Disp: , Rfl:  Vardenafil HCl 10 MG TBDP, Take 10 mg by mouth daily., Disp: 10 tablet, Rfl: 2;  verapamil (COVERA HS) 180 MG (CO) 24 hr tablet, Take 180 mg by mouth at bedtime., Disp: , Rfl: ;  triamcinolone cream (KENALOG) 0.1 %, Apply 1 application topically 2 (two) times daily., Disp: 30 g, Rfl: 0  EXAM:  Filed Vitals:   07/03/14 1049  BP: 100/80  Pulse: 94  Temp: 98.6 F (37 C)    Body mass index is 31.03 kg/(m^2).  GENERAL: vitals reviewed and listed above, alert, oriented, appears well  hydrated and in no acute distress  HEENT: atraumatic, conjunttiva clear, no obvious abnormalities on inspection of external nose and ears  NECK: no obvious masses on inspection  SKIN: a few scattered erythematous small papules on ankles and arms  MS: moves all extremities without noticeable abnormality  PSYCH: pleasant and cooperative, no obvious depression or anxiety  ASSESSMENT AND PLAN:  Discussed the following assessment and plan:  Insect bite - Plan: triamcinolone cream (KENALOG) 0.1 %  -this looks more like insect bites, discussed likely causes such as chiggers or fleas and treatment and avoidence ad follow up precautions -flu shot today -Patient advised to return or notify a doctor immediately if symptoms worsen or persist or new concerns arise.  There are no Patient Instructions  on file for this visit.   Kriste Basque R.

## 2014-07-03 NOTE — Addendum Note (Signed)
Addended by: Johnella Moloney on: 07/03/2014 11:31 AM   Modules accepted: Orders

## 2014-07-03 NOTE — Progress Notes (Signed)
Pre visit review using our clinic review tool, if applicable. No additional management support is needed unless otherwise documented below in the visit note. 

## 2014-09-03 ENCOUNTER — Telehealth: Payer: Self-pay | Admitting: Family Medicine

## 2014-09-03 NOTE — Telephone Encounter (Signed)
Patient Information:  Caller Name: Edward Curtis  Phone: [phone]  Patient: Edward Curtis, Edward Curtis  Gender: Male  DOB: 1955-12-17  Age: [age] Years  PCP: Selena BattenKim (TEXT 1st, after 20 mins can call), [facility]  Office Follow Up:  Does the office need to follow up with this patient?: No  Instructions For The Office: N/A  RN Note:  No appt available today, scheduled first available appt for AM.   Symptoms  Reason For Call & Symptoms: Pt reports bloody diarrhea/loose stool with mucus. Pt reports increased weakness, no appetite, no feeling well.  Reviewed Health History In EMR: Yes  Reviewed Medications In EMR: Yes  Reviewed Allergies In EMR: Yes  Reviewed Surgeries / Procedures: Yes  Date of Onset of Symptoms: 08/26/2014  Guideline(s) Used:  Diarrhea  Disposition Per Guideline:   See Today in Office  Reason For Disposition Reached:   Mucus or pus in stool has been present > 2 days and diarrhea is more than mild  Advice Given:  Call Back If:  You become worse.  Patient Will Follow Care Advice:  YES  Appointment Scheduled:  09/04/2014 08:45:00 Appointment Scheduled Provider:  Selena BattenKim (TEXT 1st, after 20 mins can call), [facility]

## 2014-09-03 NOTE — Telephone Encounter (Signed)
Patient Information:  Caller Name: Nicholaus BloomKelley  Phone: [phone]  Patient: Edward Curtis, Edward Curtis  Gender: Male  DOB: 11-22-1955  Age: [age] Years  PCP: Selena BattenKim (TEXT 1st, after 20 mins can call), Dahlia ClientHannah [facility])  Office Follow Up:  Does the office need to follow up with this patient?: No  Instructions For The Office: N/A  RN Note:  Pt not present for triage.  Family history of colon cancer.  Pt never had colonoscopy.  Intially reported Curtis lot of blood with stool. Mild diarrhea present.  Last bloody stool was estimated to be 09/01/14.  Been taking Naproxyn for past 2 weeks for back problem.  Reports abdominal pain continues after passing diarrhea.  Too late for office appointment.   Advised to see  [facility] now for abdominal pain per Rectal Bleeding.  Verbalized understanding . Will notify brother and try to convince him to [facility]  Symptoms  Reason For Call & Symptoms: Called for brother who has concerning symptoms and is at work, unable to call.  Reports passing bright red blood with mucousy stools, anorexia, nausea and weakness. Reported 2-3 stools daily.  Reviewed Health History In EMR: Yes  Reviewed Medications In EMR: Yes  Reviewed Allergies In EMR: Yes  Reviewed Surgeries / Procedures: Yes  Date of Onset of Symptoms: 09/01/2014  Guideline(s) Used:  Rectal Bleeding  Diarrhea  Disposition Per Guideline:   See Today in Office  Reason For Disposition Reached:   Abdominal pain (Exception: pain clears completely with each passage of diarrhea stool)  Advice Given:  Fluids:  Drink more fluids, at least 8-10 glasses (8 oz or 240 ml) daily.  Supplement this with saltine crackers or soups to make certain that you are getting sufficient fluid and salt to meet your body's needs.  Avoid caffeinated beverages (Reason: caffeine is mildly dehydrating).  Call Back If:  Signs of dehydration occur (e.g., no urine for more than 12 hours, very dry mouth, lightheaded,  etc.)  Diarrhea lasts over 7 days  You become worse.  RN Overrode Recommendation:  Go To [facility]  Too late for office appointment

## 2014-09-04 ENCOUNTER — Encounter: Payer: Self-pay | Admitting: Family Medicine

## 2014-09-04 ENCOUNTER — Telehealth: Payer: Self-pay | Admitting: Family Medicine

## 2014-09-04 ENCOUNTER — Ambulatory Visit (INDEPENDENT_AMBULATORY_CARE_PROVIDER_SITE_OTHER): Payer: Federal, State, Local not specified - PPO | Admitting: Family Medicine

## 2014-09-04 ENCOUNTER — Encounter: Payer: Self-pay | Admitting: *Deleted

## 2014-09-04 VITALS — BP 102/78 | HR 111 | Temp 98.2°F | Ht 68.0 in | Wt 202.4 lb

## 2014-09-04 DIAGNOSIS — K529 Noninfective gastroenteritis and colitis, unspecified: Secondary | ICD-10-CM

## 2014-09-04 DIAGNOSIS — K602 Anal fissure, unspecified: Secondary | ICD-10-CM

## 2014-09-04 DIAGNOSIS — K629 Disease of anus and rectum, unspecified: Secondary | ICD-10-CM

## 2014-09-04 DIAGNOSIS — Z1211 Encounter for screening for malignant neoplasm of colon: Secondary | ICD-10-CM

## 2014-09-04 DIAGNOSIS — E785 Hyperlipidemia, unspecified: Secondary | ICD-10-CM

## 2014-09-04 DIAGNOSIS — K625 Hemorrhage of anus and rectum: Secondary | ICD-10-CM

## 2014-09-04 DIAGNOSIS — R7303 Prediabetes: Secondary | ICD-10-CM

## 2014-09-04 DIAGNOSIS — R7309 Other abnormal glucose: Secondary | ICD-10-CM

## 2014-09-04 DIAGNOSIS — E669 Obesity, unspecified: Secondary | ICD-10-CM

## 2014-09-04 LAB — CBC WITH DIFFERENTIAL/PLATELET
Basophils Absolute: 0 10*3/uL (ref 0.0–0.1)
Basophils Relative: 0.5 % (ref 0.0–3.0)
EOS ABS: 0.2 10*3/uL (ref 0.0–0.7)
Eosinophils Relative: 2.1 % (ref 0.0–5.0)
HCT: 47.8 % (ref 39.0–52.0)
HEMOGLOBIN: 16.1 g/dL (ref 13.0–17.0)
Lymphocytes Relative: 20.1 % (ref 12.0–46.0)
Lymphs Abs: 1.8 10*3/uL (ref 0.7–4.0)
MCHC: 33.6 g/dL (ref 30.0–36.0)
MCV: 90.9 fl (ref 78.0–100.0)
Monocytes Absolute: 0.8 10*3/uL (ref 0.1–1.0)
Monocytes Relative: 8.2 % (ref 3.0–12.0)
NEUTROS ABS: 6.3 10*3/uL (ref 1.4–7.7)
NEUTROS PCT: 69.1 % (ref 43.0–77.0)
PLATELETS: 227 10*3/uL (ref 150.0–400.0)
RBC: 5.26 Mil/uL (ref 4.22–5.81)
RDW: 13.7 % (ref 11.5–15.5)
WBC: 9.1 10*3/uL (ref 4.0–10.5)

## 2014-09-04 LAB — LIPID PANEL
CHOL/HDL RATIO: 9
CHOLESTEROL: 262 mg/dL — AB (ref 0–200)
HDL: 27.6 mg/dL — ABNORMAL LOW (ref >39.00)
NONHDL: 234.4
Triglycerides: 317 mg/dL — ABNORMAL HIGH (ref 0.0–149.0)
VLDL: 63.4 mg/dL — ABNORMAL HIGH (ref 0.0–40.0)

## 2014-09-04 LAB — BASIC METABOLIC PANEL
BUN: 12 mg/dL (ref 6–23)
CHLORIDE: 106 meq/L (ref 96–112)
CO2: 29 mEq/L (ref 19–32)
Calcium: 9.5 mg/dL (ref 8.4–10.5)
Creatinine, Ser: 1 mg/dL (ref 0.4–1.5)
GFR: 84.02 mL/min (ref >60.00)
Glucose, Bld: 87 mg/dL (ref 70–99)
Potassium: 4.2 mEq/L (ref 3.5–5.1)
SODIUM: 140 meq/L (ref 135–145)

## 2014-09-04 LAB — HEMOGLOBIN A1C: Hgb A1c MFr Bld: 5.4 % (ref 4.6–6.5)

## 2014-09-04 LAB — LDL CHOLESTEROL, DIRECT: LDL DIRECT: 151.2 mg/dL

## 2014-09-04 MED ORDER — HYDROCORTISONE 2.5 % RE CREA: 1.0000 "application " | TOPICAL_CREAM | Freq: Two times a day (BID) | RECTAL | Status: DC

## 2014-09-04 NOTE — Telephone Encounter (Signed)
Patient informed. 

## 2014-09-04 NOTE — Telephone Encounter (Signed)
Pt was seen today and needs a note for 11-10 thru 11-11 and return to work tomorrow 09-05-14. Pt ate something today and feeling sick. Pt will like to pick up note today

## 2014-09-04 NOTE — Telephone Encounter (Signed)
Dr Kim-Patient has appt today at 8:45am.

## 2014-09-04 NOTE — Patient Instructions (Addendum)
-  can use loperamide for diarrhea if needed  -cream for rectal area twice daily for 1 week  -Plenty of fluids   -We placed a referral for you as discussed for the colonoscopy. It usually takes about 1-2 weeks to process and schedule this referral. If you have not heard from us regarding this appointment in 2 weeks please contact our office.  -We have ordered labs or studies at this visit. It can take up to 1-2 weeks for results and processing. We will contact you with instructions IF your results are abnormal. Normal results will be released to [facility]. If you have not heard from us or can not find your results in [facility] in 2 weeks please contact our office.  -follow up in 1 month   -seek care immediately if worsening, change is symptoms or other concerns

## 2014-09-04 NOTE — Telephone Encounter (Signed)
Note approved by Dr Selena BattenKim and this was completed and left at the front desk for him to pick up.

## 2014-09-04 NOTE — Progress Notes (Signed)
Pre visit review using our clinic review tool, if applicable. No additional management support is needed unless otherwise documented below in the visit note. 

## 2014-09-04 NOTE — Progress Notes (Signed)
HPI:  Acute visit for:  1) Blood in stool: -reports:  intermittent diarrhea for 4 days, had blood on TP twice 3 days ago, had some burning and itching of rectum, decrease appetite, feels weak, has some intermittent nausea and cramping in the bowels -has hx of hemorrhoids -he had been taking naproxen 1 per day for a week and percocet 1 per day for a week a few weeks ago and this caused some constipation -denies: fevers, chills, vomiting, recent travel or antibiotics -hx of IBS,GERD -he needs to get his colonoscopy too  2) Multiple chronic issues and on roc needs labs for obesity, prediabetes, hyperlipidemia  ROS: See pertinent positives and negatives per HPI.  Past Medical History  Diagnosis Date  . Chronic neck pain   . Hyperlipidemia 10/10/2013  . Hypertrophic obstructive cardiomyopathy(425.11) 10/10/2013  . Obesity (BMI 30-39.9) 10/10/2013  . IBS (irritable bowel syndrome) 12/07/2011  . Asthma   . Arthritis   . Chicken pox   . Depression   . GERD (gastroesophageal reflux disease)   . Seasonal allergies   . Migraines   . Calcium oxalate renal stones     Past Surgical History  Procedure Laterality Date  . Umbilical cyst  1985  . Left knee surgery  1990  . Pre cancerous mole  2013  . Prostate procedure  1980  . Mouth surgery  1975/1993    Family History  Problem Relation Age of Onset  . Arthritis Mother   . Arthritis Paternal Grandmother   . Colon cancer Paternal Grandfather   . Hyperlipidemia Mother   . Hyperlipidemia Maternal Grandmother   . Heart disease Mother   . Heart disease Father   . Heart disease Paternal Grandfather   . Hypertension Father   . Hypertension Mother   . Hypertension      both sets of parents  . Diabetes Father   . Diabetes Paternal Grandmother   . Diabetes Paternal Grandfather     History   Social History  . Marital Status: Single    Spouse Name: N/A    Number of Children: N/A  . Years of Education: N/A   Social History  Main Topics  . Smoking status: Never Smoker   . Smokeless tobacco: Never Used  . Alcohol Use: 0.6 oz/week    1 Glasses of wine per week  . Drug Use: No  . Sexual Activity: Not Currently   Other Topics Concern  . None   Social History Narrative    Current outpatient prescriptions: diphenhydrAMINE (BENADRYL) 25 mg capsule, Take 50 mg by mouth every 6 (six) hours as needed. , Disp: , Rfl: ;  EPINEPHrine (EPIPEN 2-PAK) 0.3 mg/0.3 mL SOAJ injection, Inject 0.3 mLs (0.3 mg total) into the muscle once as needed (for severe allergic reaction). CAll 911 immediately if you have to use this medicine, Disp: 1 Device, Rfl: 1 naproxen (NAPROSYN) 500 MG tablet, Take 500 mg by mouth 2 (two) times daily with a meal. Back pain, Disp: , Rfl: ;  oxycodone-acetaminophen (LYNOX) 10-300 MG per tablet, Take 1 tablet by mouth every 4 (four) hours as needed.  , Disp: , Rfl: ;  triamcinolone cream (KENALOG) 0.1 %, Apply 1 application topically 2 (two) times daily., Disp: 30 g, Rfl: 0;  valACYclovir (VALTREX) 500 MG tablet, Take 500 mg by mouth as needed.  , Disp: , Rfl:  Vardenafil HCl 10 MG TBDP, Take 10 mg by mouth daily., Disp: 10 tablet, Rfl: 2;  verapamil (COVERA HS) 180 MG (CO)  24 hr tablet, Take 180 mg by mouth at bedtime., Disp: , Rfl: ;  hydrocortisone (ANUSOL-HC) 2.5 % rectal cream, Place 1 application rectally 2 (two) times daily., Disp: 30 g, Rfl: 0  EXAM:  Filed Vitals:   09/04/14 0847  BP: 102/78  Pulse: 111  Temp: 98.2 F (36.8 C)    Body mass index is 30.78 kg/(m^2).  GENERAL: vitals reviewed and listed above, alert, oriented, appears well hydrated and in no acute distress  HEENT: atraumatic, conjunttiva clear, no obvious abnormalities on inspection of external nose and ears  NECK: no obvious masses on inspection  LUNGS: clear to auscultation bilaterally, no wheezes, rales or rhonchi, good air movement  ABD: BS+, soft, NTTP, no rebound or guarding  RECTAL: small healing skin tear  12'0clock otherwise normal exam, no blood on exam glove, hemocult neg  CV: HRRR, no peripheral edema  MS: moves all extremities without noticeable abnormality  PSYCH: pleasant and cooperative, no obvious depression or anxiety  ASSESSMENT AND PLAN:  Discussed the following assessment and plan:  Screening for colon cancer - Plan: Ambulatory referral to Gastroenterology  BRBPR (bright red blood per rectum) - Plan: CBC with Differential  Gastroenteritis  Obesity (BMI 30-39.9)  Hyperlipidemia - Plan: Lipid Panel  Prediabetes - Plan: Hemoglobin A1c, Basic metabolic panel  Rectal fissure - Plan: hydrocortisone (ANUSOL-HC) 2.5 % rectal cream  -suspect gastroenteritis and small amount of bleeding from skin tear, he is due for his screening colonoscopy and referral place and his basic lab work so this was ordered -discussed return and emergency precautions -Patient advised to return or notify a doctor immediately if symptoms worsen or persist or new concerns arise.  Patient Instructions  -can use loperamide for diarrhea if needed  -cream for rectal area twice daily for 1 week  -Plenty of fluids   -We placed a referral for you as discussed for the colonoscopy. It usually takes about 1-2 weeks to process and schedule this referral. If you have not heard from us regarding this appointment in 2 weeks please contact our office.  -We have ordered labs or studies at this visit. It can take up to 1-2 weeks for results and processing. We will contact you with instructions IF your results are abnormal. Normal results will be released to your [facility]. If you have not heard from us or can not find your results in [facility] in 2 weeks please contact our office.  -follow up in 1 month   -seek care immediately if worsening, change is symptoms or other concerns           Melika Reder, Dahlia ClientHANNAH R.

## 2014-10-04 ENCOUNTER — Ambulatory Visit: Payer: Federal, State, Local not specified - PPO | Admitting: Family Medicine

## 2014-10-21 ENCOUNTER — Ambulatory Visit (AMBULATORY_SURGERY_CENTER): Payer: Self-pay | Admitting: *Deleted

## 2014-10-21 VITALS — Ht 68.0 in | Wt 208.0 lb

## 2014-10-21 DIAGNOSIS — Z1211 Encounter for screening for malignant neoplasm of colon: Secondary | ICD-10-CM

## 2014-10-21 DIAGNOSIS — Z800 Family history of malignant neoplasm of digestive organs: Secondary | ICD-10-CM

## 2014-10-21 MED ORDER — MOVIPREP 100 G PO SOLR: 1.0000 | Freq: Once | ORAL | Status: DC

## 2014-10-21 NOTE — Progress Notes (Signed)
No egg or soy allergy. ewm  No diet pills. ewm  No blood thinners. ewm  No issues with past sedation. ewm  emmi video to e m,ail. ewm  Pt has an old back injury, has two torn healed discs that could tear easily so we need to be careful and not jerk legs if moved during procedure. ewm

## 2014-10-29 ENCOUNTER — Encounter: Payer: Self-pay | Admitting: Cardiology

## 2014-10-29 NOTE — Progress Notes (Signed)
Patient ID: Edward JeffersonRichard Savastano, male   DOB: 03/21/1956, 59 y.o.   MRN: [id number]   Edward Curtis, Rudolf    Date of visit:  10/29/2014 DOB:  005/27/1957    Age:  58 yrs. Medical record number:  [id number]     Account number:  [id number] [facility] ROSS ____________________________ CURRENT DIAGNOSES  1. Hypertrophic cardiomyopathy  2. Supraventricular tachycardia  3. Obesity  4. Hyperlipidemia, unspecified ____________________________ ALLERGIES  Ibuprofen, Palpitations  IVP Dye, Iodine Containing, Rash  Penicillins, Itching, non-specific  Sulfa (Sulfonamides), Intolerance-unknown  Trimethoprim, Itching, non-specific ____________________________ MEDICATIONS  1. Valtrex 500 mg tablet, PRN  2. clonazepam 0.5 mg Tablet, PRN  3. Flonase 50 mcg/Actuation Spray, Suspension, PRN  4. Tylenol Ex Str Arthritis Pain 500 mg Tablet, PRN  5. Tylenol PM 25-500 mg-mg/mL Solution, PRN  6. Benadryl 25 mg Capsule, PRN  7. Levitra 10 mg tablet, PRN  8. Percocet 5-325 mg tablet, PRN  9. verapamil ER (SR) 180 mg tablet,extended release, 1 p.o. daily  10. verapamil 80 mg tablet, 2 tabs  prn ____________________________ CHIEF COMPLAINTS  Followup of Cardiomyopathy-hypertrophic ____________________________ HISTORY OF PRESENT ILLNESS Patient seen for cardiac followup. He has been under a lot of stress this year with personal finances as well as a brother who is now living with him. He has been able to lose weight. He feels better since being on verapamil and has only used one verapamil short-acting for SVT. He denies angina and has no PND, orthopnea or edema. ____________________________ PAST HISTORY  Past Medical Illnesses:  asthma, kidney stones, lumbar disc disease, anxiety;  Cardiovascular Illnesses:  cardiomyopathy(hypertrophic);  Surgical Procedures:  arthroscopic knee surgery, umbilical cyst;  NYHA Classification:  I;  Canadian Angina Classification:  Class 0: Asymptomatic;  Cardiology  Procedures-Invasive:  no history of prior cardiac procedures;  Cardiology Procedures-Noninvasive:  echocardiogram, holter monitor, treadmill September 2008, echocardiogram October 2012, echocardiogram December 2014;  LVEF of 55% documented via echocardiogram on 08/30/2014,   ____________________________ CARDIO-PULMONARY TEST DATES EKG Date:  10/10/2013;  Holter/Event Monitor Date: 06/04/2010;  Echocardiography Date: 08/30/2014;  Chest Xray Date: 10/18/2014;   ____________________________ FAMILY HISTORY Brother -- Brother alive and well Father -- Father alive with problem, Coronary Artery Disease, Diabetes mellitus Mother -- Mother dead, Alzheimer's disease Sister -- Sister alive and well Sister -- Sister alive and well ____________________________ SOCIAL HISTORY Alcohol Use:  occasionally;  Smoking:  never smoked;  Diet:  regular diet;  Lifestyle:  single;  Exercise:  no regular exercise;  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;  Residence:  lives alone;   ____________________________ REVIEW OF SYSTEMS General:  weight gain of approximately 5 lbs Eyes: wears eye glasses/contact lenses Respiratory: history of asthma Cardiovascular:  please review HPI Abdominal: denies dyspepsia, GI bleeding, constipation, or diarrhea Genitourinary-Male: frequency  Musculoskeletal:  chronic low back pain, has had cortisone injections, cervical spondylosis  ____________________________ PHYSICAL EXAMINATION VITAL SIGNS  Blood Pressure:  110/64 Sitting, Right arm, regular cuff  , 110/60 Standing, Right arm and regular cuff   Pulse:  98/min. Weight:  208.00 lbs. Height:  68"BMI: 31  Constitutional:  pleasant white male in no acute distress, moderately obese Skin:  warm and dry to touch, no apparent skin lesions, or masses noted. Head:  normocephalic, normal hair pattern, no masses or tenderness Chest:  normal symmetry, clear to auscultation Cardiac:  regular rhythm, normal S1 and S2, No S3 or S4,  no murmurs, gallops or rubs detected. Peripheral Pulses:  the femoral,dorsalis pedis, and posterior  tibial pulses are full and equal bilaterally with no bruits auscultated. Extremities & Back:  no edema present Neurological:  no gross motor or sensory deficits noted, affect appropriate, oriented x3. ____________________________ MOST RECENT LIPID PANEL 10/01/13  CHOL TOTL 219 mg/dl, LDL 629 NM, HDL 28 mg/dl, TRIGLYCER 528 mg/dl, CHOL/HDL 7.8 (Calc)  ____________________________ IMPRESSIONS/PLAN 1. Hypertrophic cardiomyopathy-discussed this in detail with the patient. He is clinically feeling better on verapamil 2. Obesity but with weight loss since here-encouraged with this 3. Supraventricular tachycardia clinically improved  Recommendations:  Followup in one year. Call if problems. ____________________________ TODAYS ORDERS  1. Return Visit: 1 year  2. 12 Lead EKG: 1 year                       ____________________________ Cardiology Physician:  Darden Palmer MD [facility]

## 2014-11-07 ENCOUNTER — Encounter: Payer: Federal, State, Local not specified - PPO | Admitting: Internal Medicine

## 2014-11-08 ENCOUNTER — Encounter: Payer: Self-pay | Admitting: Internal Medicine

## 2015-01-09 ENCOUNTER — Encounter: Payer: Federal, State, Local not specified - PPO | Admitting: Internal Medicine

## 2015-09-29 ENCOUNTER — Ambulatory Visit (INDEPENDENT_AMBULATORY_CARE_PROVIDER_SITE_OTHER)
Admission: RE | Admit: 2015-09-29 | Discharge: 2015-09-29 | Disposition: A | Discharge status: Home / Self Care | Payer: Federal, State, Local not specified - PPO | Source: Ambulatory Visit | Attending: Family Medicine | Admitting: Family Medicine

## 2015-09-29 ENCOUNTER — Encounter: Payer: Self-pay | Admitting: Family Medicine

## 2015-09-29 ENCOUNTER — Ambulatory Visit (INDEPENDENT_AMBULATORY_CARE_PROVIDER_SITE_OTHER): Payer: Federal, State, Local not specified - PPO | Admitting: Family Medicine

## 2015-09-29 VITALS — BP 124/84 | HR 101 | Temp 98.1°F | Ht 68.0 in | Wt 222.7 lb

## 2015-09-29 DIAGNOSIS — M7918 Myalgia, other site: Secondary | ICD-10-CM

## 2015-09-29 DIAGNOSIS — M791 Myalgia: Secondary | ICD-10-CM

## 2015-09-29 DIAGNOSIS — Z23 Encounter for immunization: Secondary | ICD-10-CM | POA: Diagnosis not present

## 2015-09-29 NOTE — Patient Instructions (Signed)
BEFORE YOU LEAVE: -xray sheet -flu shot -follow up in 1 month  Go get the xray - if ok advise gentle activities, tylenol per instructions as needed for pain and heat

## 2015-09-29 NOTE — Progress Notes (Signed)
Pre visit review using our clinic review tool, if applicable. No additional management support is needed unless otherwise documented below in the visit note. 

## 2015-09-29 NOTE — Progress Notes (Signed)
HPI:  R bottock pain: -started about 2 weeks ago s/p fall on porch step - landed on R buttock and side -reports pain not bad initially and was able to bear weight fine -reports did develop bruising over R upper buttock and some pain -did seem to get better, and now no pain at rest, but does feel moderate achy pain in r upper glutes if on feet for a long time -denies: weakness, numbness, radiation, loss of bowel or bladder function -he and his sister (whom is a Engineer, civil (consulting)) are concerned for a fracture of the iliac crest  R ear pain: -a few weeks ago got hit on ear with a ball -felt pressure and loss of hearing briefly -then had ringing in ears a few times -hearing loss and pain completely resolved now  ROS: See pertinent positives and negatives per HPI.  Past Medical History  Diagnosis Date  . Chronic neck pain   . Hyperlipidemia 10/10/2013  . Hypertrophic obstructive cardiomyopathy(425.11) 10/10/2013  . Obesity (BMI 30-39.9) 10/10/2013  . IBS (irritable bowel syndrome) 12/07/2011  . Asthma   . Arthritis   . Chicken pox   . Depression   . GERD (gastroesophageal reflux disease)   . Seasonal allergies   . Migraines   . Calcium oxalate renal stones     early 1990's  . Allergy     seasonal allergies   . Neuromuscular disorder (HCC)     radiculopathy left side   . Blood transfusion without reported diagnosis 1985    Past Surgical History  Procedure Laterality Date  . Umbilical cyst  1985  . Left knee surgery  1990  . Pre cancerous mole  2013  . Prostate procedure  1980  . Mouth surgery  1975/1993    Family History  Problem Relation Age of Onset  . Arthritis Mother   . Hyperlipidemia Mother   . Heart disease Mother   . Hypertension Mother   . Arthritis Paternal Grandmother   . Diabetes Paternal Grandmother   . Colon cancer Paternal Grandfather   . Heart disease Paternal Grandfather   . Diabetes Paternal Grandfather   . Hyperlipidemia Maternal Grandmother   . Heart  disease Father   . Hypertension Father   . Diabetes Father   . Hypertension      both sets of parents  . Esophageal cancer Neg Hx   . Rectal cancer Neg Hx   . Stomach cancer Neg Hx     Social History   Social History  . Marital Status: Single    Spouse Name: N/A  . Number of Children: N/A  . Years of Education: N/A   Social History Main Topics  . Smoking status: Never Smoker   . Smokeless tobacco: Never Used  . Alcohol Use: 0.6 oz/week    1 Glasses of wine per week     Comment: once a month  . Drug Use: No  . Sexual Activity: Not Currently   Other Topics Concern  . None   Social History Narrative     Current outpatient prescriptions:  .  acetaminophen (TYLENOL) 500 MG tablet, Take 500 mg by mouth every 6 (six) hours as needed., Disp: , Rfl:  .  albuterol (PROVENTIL HFA;VENTOLIN HFA) 108 (90 BASE) MCG/ACT inhaler, Inhale 2 puffs into the lungs every 6 (six) hours as needed for wheezing or shortness of breath., Disp: , Rfl:  .  diphenhydrAMINE (BENADRYL) 25 mg capsule, Take 50 mg by mouth every 6 (six) hours as needed. ,  Disp: , Rfl:  .  diphenhydramine-acetaminophen (TYLENOL PM) 25-500 MG TABS, Take 1 tablet by mouth at bedtime as needed., Disp: , Rfl:  .  EPINEPHrine (EPIPEN 2-PAK) 0.3 mg/0.3 mL SOAJ injection, Inject 0.3 mLs (0.3 mg total) into the muscle once as needed (for severe allergic reaction). CAll 911 immediately if you have to use this medicine, Disp: 1 Device, Rfl: 1 .  fluticasone (VERAMYST) 27.5 MCG/SPRAY nasal spray, Place 2 sprays into the nose daily as needed for rhinitis., Disp: , Rfl:  .  naproxen (NAPROSYN) 500 MG tablet, Take 500 mg by mouth 2 (two) times daily with a meal. Back pain, Disp: , Rfl:  .  oxycodone-acetaminophen (LYNOX) 10-300 MG per tablet, Take 1 tablet by mouth every 4 (four) hours as needed.  , Disp: , Rfl:  .  valACYclovir (VALTREX) 500 MG tablet, Take 500 mg by mouth as needed.  , Disp: , Rfl:  .  Vardenafil HCl 10 MG TBDP, Take 10  mg by mouth daily., Disp: 10 tablet, Rfl: 2 .  verapamil (COVERA HS) 180 MG (CO) 24 hr tablet, Take 180 mg by mouth at bedtime., Disp: , Rfl:   EXAM:  Filed Vitals:   09/29/15 1026  BP: 124/84  Pulse: 101  Temp: 98.1 F (36.7 C)    Body mass index is 33.87 kg/(m^2).  GENERAL: vitals reviewed and listed above, alert, oriented, appears well hydrated and in no acute distress  HEENT: atraumatic, conjunttiva clear, no obvious abnormalities on inspection of external nose and ears, normal appearance of ear canals and TMs  NECK: no obvious masses on inspection  LUNGS: clear to auscultation bilaterally, no wheezes, rales or rhonchi, good air movement  CV: HRRR, no peripheral edema  MS: moves all extremities without noticeable abnormality, minimal late stage bruising over R glutes in small area, TTP here, no sig bony TTP, hip compression test neg, normal gait, normal strenggh, sensation to light touch and DTRs in LE bilat, no lateral hip pain or any other bony TTP, neg SLRT and CRLT, no sig pain with FABER/FADIR or int/ext rotation of hip with compression  PSYCH: pleasant and cooperative, no obvious depression or anxiety  ASSESSMENT AND PLAN:  Discussed the following assessment and plan:  Right buttock pain - Plan: DG HIP UNILAT W OR W/O PELVIS 2-3 VIEWS RIGHT  -suspect this is more likely soft tissue contusion given hx and exam, plain films ordered per pt concerns and high impact fall -supportive care and follow up in 1 month planned -ear looks good, advise ENT if any recurrent symptoms -flu shot today -Patient advised to return or notify a doctor immediately if symptoms worsen or persist or new concerns arise.  Patient Instructions  BEFORE YOU LEAVE: -xray sheet -flu shot -follow up in 1 month  Go get the xray - if ok advise gentle activities, tylenol per instructions as needed for pain and heat     KIM, HANNAH R.

## 2015-10-17 ENCOUNTER — Encounter: Payer: Self-pay | Admitting: Family Medicine

## 2015-10-17 ENCOUNTER — Ambulatory Visit (INDEPENDENT_AMBULATORY_CARE_PROVIDER_SITE_OTHER): Payer: Federal, State, Local not specified - PPO | Admitting: Family Medicine

## 2015-10-17 VITALS — BP 117/69 | HR 101 | Temp 98.4°F | Ht 68.0 in | Wt 224.0 lb

## 2015-10-17 DIAGNOSIS — N529 Male erectile dysfunction, unspecified: Secondary | ICD-10-CM | POA: Diagnosis not present

## 2015-10-17 DIAGNOSIS — H109 Unspecified conjunctivitis: Secondary | ICD-10-CM | POA: Diagnosis not present

## 2015-10-17 MED ORDER — TOBRAMYCIN-DEXAMETHASONE 0.3-0.1 % OP SUSP: 2.0000 [drp] | OPHTHALMIC | Status: DC

## 2015-10-17 NOTE — Progress Notes (Signed)
   Subjective:    Patient ID: Edward Curtis, male    DOB: August 12, 1956, 59 y.o.   MRN: [id number]  HPI Here for 2 days of intermittent redness, itching, burning, and excess mucus production. No change in acuity, no photophobia. Also he asks if he can try Cialis for his ED. He has used Levitra in the past.    Review of Systems  Constitutional: Negative.   HENT: Negative.   Eyes: Positive for discharge, redness and itching. Negative for photophobia and visual disturbance.  Respiratory: Negative.        Objective:   Physical Exam  Constitutional: He appears well-developed and well-nourished.  HENT:  Right Ear: External ear normal.  Left Ear: External ear normal.  Nose: Nose normal.  Mouth/Throat: Oropharynx is clear and moist.  Eyes: Conjunctivae are normal. Pupils are equal, round, and reactive to light.  Neck: Neck supple. No thyromegaly present.  Pulmonary/Chest: Effort normal and breath sounds normal.  Lymphadenopathy:    He has no cervical adenopathy.          Assessment & Plan:  [place], given Tobradex drops. For the ED he can try samples of Cialis 5 mg.

## 2015-10-17 NOTE — Progress Notes (Signed)
Pre visit review using our clinic review tool, if applicable. No additional management support is needed unless otherwise documented below in the visit note. 

## 2015-10-31 ENCOUNTER — Encounter: Payer: Self-pay | Admitting: Family Medicine

## 2015-10-31 ENCOUNTER — Ambulatory Visit (INDEPENDENT_AMBULATORY_CARE_PROVIDER_SITE_OTHER): Payer: Federal, State, Local not specified - PPO | Admitting: Family Medicine

## 2015-10-31 VITALS — BP 120/82 | HR 114 | Temp 98.5°F | Ht 68.0 in | Wt 225.7 lb

## 2015-10-31 DIAGNOSIS — E785 Hyperlipidemia, unspecified: Secondary | ICD-10-CM | POA: Diagnosis not present

## 2015-10-31 DIAGNOSIS — M791 Myalgia: Secondary | ICD-10-CM

## 2015-10-31 DIAGNOSIS — R7303 Prediabetes: Secondary | ICD-10-CM | POA: Diagnosis not present

## 2015-10-31 DIAGNOSIS — M7918 Myalgia, other site: Secondary | ICD-10-CM

## 2015-10-31 DIAGNOSIS — E669 Obesity, unspecified: Secondary | ICD-10-CM

## 2015-10-31 NOTE — Patient Instructions (Signed)
BEFORE YOU LEAVE: -Physical exam in the next 3 months -come fasting for 8 hours, DRINK plenty of WATER.  We recommend the following healthy lifestyle measures: - eat a healthy whole foods diet consisting of regular small meals composed of vegetables, fruits, beans, nuts, seeds, healthy meats such as white chicken and fish and whole grains.  - avoid sweets, white starchy foods, fried foods, fast food, processed foods, sodas, red meet and other fattening foods.  - get a least 150-300 minutes of aerobic exercise per week.

## 2015-10-31 NOTE — Progress Notes (Signed)
HPI:  Follow up R buttock pain s/p fall. Xrays were good. He reports resolved completely. In conversation we noted weight has increased. He does not exercise and portion size is poor. He is not fasting today and wants to check labs fasting at physical. No CP, SOB, DOE.  ROS: See pertinent positives and negatives per HPI.  Past Medical History  Diagnosis Date  . Chronic neck pain   . Hyperlipidemia 10/10/2013  . Hypertrophic obstructive cardiomyopathy(425.11) 10/10/2013  . Obesity (BMI 30-39.9) 10/10/2013  . IBS (irritable bowel syndrome) 12/07/2011  . Asthma   . Arthritis   . Chicken pox   . Depression   . GERD (gastroesophageal reflux disease)   . Seasonal allergies   . Migraines   . Calcium oxalate renal stones     early 1990's  . Allergy     seasonal allergies   . Neuromuscular disorder (HCC)     radiculopathy left side   . Blood transfusion without reported diagnosis 1985    Past Surgical History  Procedure Laterality Date  . Umbilical cyst  1985  . Left knee surgery  1990  . Pre cancerous mole  2013  . Prostate procedure  1980  . Mouth surgery  1975/1993    Family History  Problem Relation Age of Onset  . Arthritis Mother   . Hyperlipidemia Mother   . Heart disease Mother   . Hypertension Mother   . Arthritis Paternal Grandmother   . Diabetes Paternal Grandmother   . Colon cancer Paternal Grandfather   . Heart disease Paternal Grandfather   . Diabetes Paternal Grandfather   . Hyperlipidemia Maternal Grandmother   . Heart disease Father   . Hypertension Father   . Diabetes Father   . Hypertension      both sets of parents  . Esophageal cancer Neg Hx   . Rectal cancer Neg Hx   . Stomach cancer Neg Hx     Social History   Social History  . Marital Status: Single    Spouse Name: N/A  . Number of Children: N/A  . Years of Education: N/A   Social History Main Topics  . Smoking status: Never Smoker   . Smokeless tobacco: Never Used  . Alcohol Use:  0.6 oz/week    1 Glasses of wine per week     Comment: once a month  . Drug Use: No  . Sexual Activity: Not Currently   Other Topics Concern  . None   Social History Narrative     Current outpatient prescriptions:  .  acetaminophen (TYLENOL) 500 MG tablet, Take 500 mg by mouth every 6 (six) hours as needed., Disp: , Rfl:  .  albuterol (PROVENTIL HFA;VENTOLIN HFA) 108 (90 BASE) MCG/ACT inhaler, Inhale 2 puffs into the lungs every 6 (six) hours as needed for wheezing or shortness of breath. Reported on 10/17/2015, Disp: , Rfl:  .  diphenhydrAMINE (BENADRYL) 25 mg capsule, Take 50 mg by mouth every 6 (six) hours as needed. , Disp: , Rfl:  .  diphenhydramine-acetaminophen (TYLENOL PM) 25-500 MG TABS, Take 1 tablet by mouth at bedtime as needed., Disp: , Rfl:  .  EPINEPHrine (EPIPEN 2-PAK) 0.3 mg/0.3 mL SOAJ injection, Inject 0.3 mLs (0.3 mg total) into the muscle once as needed (for severe allergic reaction). CAll 911 immediately if you have to use this medicine, Disp: 1 Device, Rfl: 1 .  fluticasone (VERAMYST) 27.5 MCG/SPRAY nasal spray, Place 2 sprays into the nose daily as needed for rhinitis.  Reported on 10/17/2015, Disp: , Rfl:  .  naproxen (NAPROSYN) 500 MG tablet, Take 500 mg by mouth 2 (two) times daily with a meal. Back pain, Disp: , Rfl:  .  oxycodone-acetaminophen (LYNOX) 10-300 MG per tablet, Take 1 tablet by mouth every 4 (four) hours as needed.  , Disp: , Rfl:  .  tobramycin-dexamethasone (TOBRADEX) ophthalmic solution, Place 2 drops into the left eye every 4 (four) hours while awake., Disp: 5 mL, Rfl: 0 .  valACYclovir (VALTREX) 500 MG tablet, Take 500 mg by mouth as needed.  , Disp: , Rfl:  .  Vardenafil HCl 10 MG TBDP, Take 10 mg by mouth daily., Disp: 10 tablet, Rfl: 2 .  verapamil (COVERA HS) 180 MG (CO) 24 hr tablet, Take 180 mg by mouth at bedtime., Disp: , Rfl:   EXAM:  Filed Vitals:   10/31/15 0938  BP: 120/82  Pulse: 114  Temp: 98.5 F (36.9 C)    Body mass  index is 34.33 kg/(m^2).  GENERAL: vitals reviewed and listed above, alert, oriented, appears well hydrated and in no acute distress  HEENT: atraumatic, conjunttiva clear, no obvious abnormalities on inspection of external nose and ears  NECK: no obvious masses on inspection  LUNGS: clear to auscultation bilaterally, no wheezes, rales or rhonchi, good air movement  CV: HRRR, no peripheral edema  MS: moves all extremities without noticeable abnormality  PSYCH: pleasant and cooperative, no obvious depression or anxiety  ASSESSMENT AND PLAN:  Discussed the following assessment and plan:  Buttock pain  Obesity  Hyperlipidemia  Prediabetes  -lengthy discussion on lifestyle changes - what a healthy plate and portion size looks like. Advised healthy diet, small portions and regular exercise -CPE in 3 months, labs then -Patient advised to return or notify a doctor immediately if symptoms worsen or persist or new concerns arise.  Patient Instructions  BEFORE YOU LEAVE: -Physical exam in the next 3 months -come fasting for 8 hours, DRINK plenty of WATER.  We recommend the following healthy lifestyle measures: - eat a healthy whole foods diet consisting of regular small meals composed of vegetables, fruits, beans, nuts, seeds, healthy meats such as white chicken and fish and whole grains.  - avoid sweets, white starchy foods, fried foods, fast food, processed foods, sodas, red meet and other fattening foods.  - get a least 150-300 minutes of aerobic exercise per week.       Kriste Basque R.

## 2015-10-31 NOTE — Progress Notes (Signed)
Pre visit review using our clinic review tool, if applicable. No additional management support is needed unless otherwise documented below in the visit note. 

## 2016-01-29 ENCOUNTER — Encounter: Payer: Federal, State, Local not specified - PPO | Admitting: Family Medicine

## 2016-05-04 DIAGNOSIS — Z6833 Body mass index (BMI) 33.0-33.9, adult: Secondary | ICD-10-CM | POA: Diagnosis not present

## 2016-05-04 DIAGNOSIS — M5126 Other intervertebral disc displacement, lumbar region: Secondary | ICD-10-CM | POA: Diagnosis not present

## 2016-05-04 DIAGNOSIS — M5417 Radiculopathy, lumbosacral region: Secondary | ICD-10-CM | POA: Diagnosis not present

## 2016-05-04 DIAGNOSIS — I10 Essential (primary) hypertension: Secondary | ICD-10-CM | POA: Diagnosis not present

## 2016-06-01 DIAGNOSIS — K080 Exfoliation of teeth due to systemic causes: Secondary | ICD-10-CM | POA: Diagnosis not present

## 2016-06-08 DIAGNOSIS — K080 Exfoliation of teeth due to systemic causes: Secondary | ICD-10-CM | POA: Diagnosis not present

## 2016-07-01 DIAGNOSIS — K080 Exfoliation of teeth due to systemic causes: Secondary | ICD-10-CM | POA: Diagnosis not present

## 2016-07-28 DIAGNOSIS — K080 Exfoliation of teeth due to systemic causes: Secondary | ICD-10-CM | POA: Diagnosis not present

## 2016-08-26 DIAGNOSIS — Z79899 Other long term (current) drug therapy: Secondary | ICD-10-CM | POA: Diagnosis not present

## 2016-08-26 DIAGNOSIS — F1120 Opioid dependence, uncomplicated: Secondary | ICD-10-CM | POA: Diagnosis not present

## 2016-08-26 DIAGNOSIS — M5020 Other cervical disc displacement, unspecified cervical region: Secondary | ICD-10-CM | POA: Diagnosis not present

## 2016-08-26 DIAGNOSIS — Z6833 Body mass index (BMI) 33.0-33.9, adult: Secondary | ICD-10-CM | POA: Diagnosis not present

## 2016-09-14 DIAGNOSIS — M546 Pain in thoracic spine: Secondary | ICD-10-CM | POA: Diagnosis not present

## 2016-09-14 DIAGNOSIS — M9901 Segmental and somatic dysfunction of cervical region: Secondary | ICD-10-CM | POA: Diagnosis not present

## 2016-09-14 DIAGNOSIS — M9903 Segmental and somatic dysfunction of lumbar region: Secondary | ICD-10-CM | POA: Diagnosis not present

## 2016-09-14 DIAGNOSIS — M9902 Segmental and somatic dysfunction of thoracic region: Secondary | ICD-10-CM | POA: Diagnosis not present

## 2016-09-15 DIAGNOSIS — M9902 Segmental and somatic dysfunction of thoracic region: Secondary | ICD-10-CM | POA: Diagnosis not present

## 2016-09-15 DIAGNOSIS — M9903 Segmental and somatic dysfunction of lumbar region: Secondary | ICD-10-CM | POA: Diagnosis not present

## 2016-09-15 DIAGNOSIS — M9901 Segmental and somatic dysfunction of cervical region: Secondary | ICD-10-CM | POA: Diagnosis not present

## 2016-09-15 DIAGNOSIS — M546 Pain in thoracic spine: Secondary | ICD-10-CM | POA: Diagnosis not present

## 2016-09-20 DIAGNOSIS — M546 Pain in thoracic spine: Secondary | ICD-10-CM | POA: Diagnosis not present

## 2016-09-20 DIAGNOSIS — M9903 Segmental and somatic dysfunction of lumbar region: Secondary | ICD-10-CM | POA: Diagnosis not present

## 2016-09-20 DIAGNOSIS — M9902 Segmental and somatic dysfunction of thoracic region: Secondary | ICD-10-CM | POA: Diagnosis not present

## 2016-09-20 DIAGNOSIS — M9901 Segmental and somatic dysfunction of cervical region: Secondary | ICD-10-CM | POA: Diagnosis not present

## 2016-09-23 DIAGNOSIS — M9902 Segmental and somatic dysfunction of thoracic region: Secondary | ICD-10-CM | POA: Diagnosis not present

## 2016-09-23 DIAGNOSIS — M9903 Segmental and somatic dysfunction of lumbar region: Secondary | ICD-10-CM | POA: Diagnosis not present

## 2016-09-23 DIAGNOSIS — M9901 Segmental and somatic dysfunction of cervical region: Secondary | ICD-10-CM | POA: Diagnosis not present

## 2016-09-23 DIAGNOSIS — M546 Pain in thoracic spine: Secondary | ICD-10-CM | POA: Diagnosis not present

## 2016-09-27 DIAGNOSIS — M546 Pain in thoracic spine: Secondary | ICD-10-CM | POA: Diagnosis not present

## 2016-09-27 DIAGNOSIS — M9902 Segmental and somatic dysfunction of thoracic region: Secondary | ICD-10-CM | POA: Diagnosis not present

## 2016-09-27 DIAGNOSIS — M9903 Segmental and somatic dysfunction of lumbar region: Secondary | ICD-10-CM | POA: Diagnosis not present

## 2016-09-27 DIAGNOSIS — M9901 Segmental and somatic dysfunction of cervical region: Secondary | ICD-10-CM | POA: Diagnosis not present

## 2016-09-30 DIAGNOSIS — M546 Pain in thoracic spine: Secondary | ICD-10-CM | POA: Diagnosis not present

## 2016-09-30 DIAGNOSIS — M9902 Segmental and somatic dysfunction of thoracic region: Secondary | ICD-10-CM | POA: Diagnosis not present

## 2016-09-30 DIAGNOSIS — M9901 Segmental and somatic dysfunction of cervical region: Secondary | ICD-10-CM | POA: Diagnosis not present

## 2016-09-30 DIAGNOSIS — M9903 Segmental and somatic dysfunction of lumbar region: Secondary | ICD-10-CM | POA: Diagnosis not present

## 2016-10-04 DIAGNOSIS — M546 Pain in thoracic spine: Secondary | ICD-10-CM | POA: Diagnosis not present

## 2016-10-04 DIAGNOSIS — M9902 Segmental and somatic dysfunction of thoracic region: Secondary | ICD-10-CM | POA: Diagnosis not present

## 2016-10-04 DIAGNOSIS — M9901 Segmental and somatic dysfunction of cervical region: Secondary | ICD-10-CM | POA: Diagnosis not present

## 2016-10-04 DIAGNOSIS — M9903 Segmental and somatic dysfunction of lumbar region: Secondary | ICD-10-CM | POA: Diagnosis not present

## 2016-10-07 DIAGNOSIS — M546 Pain in thoracic spine: Secondary | ICD-10-CM | POA: Diagnosis not present

## 2016-10-07 DIAGNOSIS — M9903 Segmental and somatic dysfunction of lumbar region: Secondary | ICD-10-CM | POA: Diagnosis not present

## 2016-10-07 DIAGNOSIS — M9901 Segmental and somatic dysfunction of cervical region: Secondary | ICD-10-CM | POA: Diagnosis not present

## 2016-10-07 DIAGNOSIS — M9902 Segmental and somatic dysfunction of thoracic region: Secondary | ICD-10-CM | POA: Diagnosis not present

## 2016-10-11 DIAGNOSIS — M546 Pain in thoracic spine: Secondary | ICD-10-CM | POA: Diagnosis not present

## 2016-10-11 DIAGNOSIS — M9903 Segmental and somatic dysfunction of lumbar region: Secondary | ICD-10-CM | POA: Diagnosis not present

## 2016-10-11 DIAGNOSIS — M9902 Segmental and somatic dysfunction of thoracic region: Secondary | ICD-10-CM | POA: Diagnosis not present

## 2016-10-11 DIAGNOSIS — M9901 Segmental and somatic dysfunction of cervical region: Secondary | ICD-10-CM | POA: Diagnosis not present

## 2016-10-21 DIAGNOSIS — M546 Pain in thoracic spine: Secondary | ICD-10-CM | POA: Diagnosis not present

## 2016-10-21 DIAGNOSIS — M9903 Segmental and somatic dysfunction of lumbar region: Secondary | ICD-10-CM | POA: Diagnosis not present

## 2016-10-21 DIAGNOSIS — M9902 Segmental and somatic dysfunction of thoracic region: Secondary | ICD-10-CM | POA: Diagnosis not present

## 2016-10-21 DIAGNOSIS — M9901 Segmental and somatic dysfunction of cervical region: Secondary | ICD-10-CM | POA: Diagnosis not present

## 2016-10-22 DIAGNOSIS — M9903 Segmental and somatic dysfunction of lumbar region: Secondary | ICD-10-CM | POA: Diagnosis not present

## 2016-10-22 DIAGNOSIS — M546 Pain in thoracic spine: Secondary | ICD-10-CM | POA: Diagnosis not present

## 2016-10-22 DIAGNOSIS — M9901 Segmental and somatic dysfunction of cervical region: Secondary | ICD-10-CM | POA: Diagnosis not present

## 2016-10-22 DIAGNOSIS — M9902 Segmental and somatic dysfunction of thoracic region: Secondary | ICD-10-CM | POA: Diagnosis not present

## 2016-10-28 DIAGNOSIS — M9903 Segmental and somatic dysfunction of lumbar region: Secondary | ICD-10-CM | POA: Diagnosis not present

## 2016-10-28 DIAGNOSIS — M9902 Segmental and somatic dysfunction of thoracic region: Secondary | ICD-10-CM | POA: Diagnosis not present

## 2016-10-28 DIAGNOSIS — M9901 Segmental and somatic dysfunction of cervical region: Secondary | ICD-10-CM | POA: Diagnosis not present

## 2016-10-28 DIAGNOSIS — M546 Pain in thoracic spine: Secondary | ICD-10-CM | POA: Diagnosis not present

## 2016-11-03 ENCOUNTER — Encounter: Payer: Self-pay | Admitting: Adult Health

## 2016-11-03 ENCOUNTER — Ambulatory Visit (INDEPENDENT_AMBULATORY_CARE_PROVIDER_SITE_OTHER): Payer: Federal, State, Local not specified - PPO | Admitting: Adult Health

## 2016-11-03 VITALS — BP 130/64 | Temp 98.2°F | Ht 68.0 in | Wt 214.8 lb

## 2016-11-03 DIAGNOSIS — N529 Male erectile dysfunction, unspecified: Secondary | ICD-10-CM

## 2016-11-03 DIAGNOSIS — J069 Acute upper respiratory infection, unspecified: Secondary | ICD-10-CM

## 2016-11-03 MED ORDER — TADALAFIL 5 MG PO TABS: 5.0000 mg | ORAL_TABLET | Freq: Every day | ORAL | 0 refills | Status: DC | PRN

## 2016-11-03 MED ORDER — FLUTICASONE PROPIONATE 50 MCG/ACT NA SUSP: 2.0000 | Freq: Every day | NASAL | 6 refills | Status: DC

## 2016-11-03 MED ORDER — HYDROCODONE-HOMATROPINE 5-1.5 MG/5ML PO SYRP: 5.0000 mL | ORAL_SOLUTION | Freq: Three times a day (TID) | ORAL | 0 refills | Status: DC | PRN

## 2016-11-03 NOTE — Progress Notes (Signed)
Subjective:    Patient ID: Edward Curtis, male    DOB: [birth year], 61 y.o.   MRN: [id number]  Cough  This is a new problem. The current episode started in the past 7 days (4 days ). The problem has been gradually improving. The cough is non-productive. Associated symptoms include ear pain, nasal congestion, postnasal drip and a sore throat (resolved). Pertinent negatives include no chest pain, ear congestion, fever, heartburn, rhinorrhea or wheezing. The symptoms are aggravated by lying down. He has tried OTC cough suppressant for the symptoms. The treatment provided no relief. His past medical history is significant for bronchitis.    He would also like a refill of Cialis 5 mg. He was given a sample pack and reports that it worked well for him.   Review of Systems  Constitutional: Negative for fever.  HENT: Positive for ear pain, postnasal drip and sore throat (resolved). Negative for rhinorrhea.   Respiratory: Positive for cough. Negative for chest tightness and wheezing.   Cardiovascular: Negative for chest pain.  Gastrointestinal: Negative for heartburn.   Past Medical History:  Diagnosis Date  . Allergy    seasonal allergies   . Arthritis   . Asthma   . Blood transfusion without reported diagnosis 1985  . Calcium oxalate renal stones    early 1990's  . Chicken pox   . Chronic neck pain   . Depression   . GERD (gastroesophageal reflux disease)   . Hyperlipidemia 10/10/2013  . Hypertrophic obstructive cardiomyopathy(425.11) 10/10/2013  . IBS (irritable bowel syndrome) 12/07/2011  . Migraines   . Neuromuscular disorder (HCC)    radiculopathy left side   . Obesity (BMI 30-39.9) 10/10/2013  . Seasonal allergies     Social History   Social History  . Marital status: Single    Spouse name: N/A  . Number of children: N/A  . Years of education: N/A   Occupational History  . Not on file.   Social History Main Topics  . Smoking status: Never Smoker  . Smokeless tobacco:  Never Used  . Alcohol use 0.6 oz/week    1 Glasses of wine per week     Comment: once a month  . Drug use: No  . Sexual activity: Not Currently   Other Topics Concern  . Not on file   Social History Narrative  . No narrative on file    Past Surgical History:  Procedure Laterality Date  . left knee surgery  1990  . MOUTH SURGERY  1975/1993  . pre cancerous mole  2013  . prostate procedure  1980  . umbilical cyst  1985    Family History  Problem Relation Age of Onset  . Arthritis Mother   . Hyperlipidemia Mother   . Heart disease Mother   . Hypertension Mother   . Arthritis Paternal Grandmother   . Diabetes Paternal Grandmother   . Colon cancer Paternal Grandfather   . Heart disease Paternal Grandfather   . Diabetes Paternal Grandfather   . Hyperlipidemia Maternal Grandmother   . Heart disease Father   . Hypertension Father   . Diabetes Father   . Hypertension      both sets of parents  . Esophageal cancer Neg Hx   . Rectal cancer Neg Hx   . Stomach cancer Neg Hx     Allergies  Allergen Reactions  . Iodinated Diagnostic Agents Hives and Itching    Patient must be premedicated with 13 hour protocol prior to IV contrast administration  .  Erythromycin     emycin and clindamycin with nausea  . Ibuprofen Other (See Comments)    Causes tachycardia  . Other Other (See Comments)     Walnuts-Sores/ulcers in mouth, swelling in tongue   . Penicillins Hives  . Sulfa Antibiotics Hives    Current Outpatient Prescriptions on File Prior to Visit  Medication Sig Dispense Refill  . acetaminophen (TYLENOL) 500 MG tablet Take 500 mg by mouth every 6 (six) hours as needed.    [name] albuterol (PROVENTIL HFA;VENTOLIN HFA) 108 (90 BASE) MCG/ACT inhaler Inhale 2 puffs into the lungs every 6 (six) hours as needed for wheezing or shortness of breath. Reported on 10/17/2015    . diphenhydrAMINE (BENADRYL) 25 mg capsule Take 50 mg by mouth every 6 (six) hours as needed.     .  diphenhydramine-acetaminophen (TYLENOL PM) 25-500 MG TABS Take 1 tablet by mouth at bedtime as needed.    [name] EPINEPHrine (EPIPEN 2-PAK) 0.3 mg/0.3 mL SOAJ injection Inject 0.3 mLs (0.3 mg total) into the muscle once as needed (for severe allergic reaction). CAll 911 immediately if you have to use this medicine 1 Device 1  . fluticasone (VERAMYST) 27.5 MCG/SPRAY nasal spray Place 2 sprays into the nose daily as needed for rhinitis. Reported on 10/17/2015    . naproxen (NAPROSYN) 500 MG tablet Take 500 mg by mouth 2 (two) times daily with a meal. Back pain    . oxycodone-acetaminophen (LYNOX) 10-300 MG per tablet Take 1 tablet by mouth every 4 (four) hours as needed.      . tobramycin-dexamethasone (TOBRADEX) ophthalmic solution Place 2 drops into the left eye every 4 (four) hours while awake. 5 mL 0  . valACYclovir (VALTREX) 500 MG tablet Take 500 mg by mouth as needed.      . Vardenafil HCl 10 MG TBDP Take 10 mg by mouth daily. 10 tablet 2  . verapamil (COVERA HS) 180 MG (CO) 24 hr tablet Take 180 mg by mouth at bedtime.     No current facility-administered medications on file prior to visit.     BP 130/64   Temp 98.2 F (36.8 C) (Oral)   Ht 5\' 8"  (1.727 m)   Wt 214 lb 12.8 oz (97.4 kg)   BMI 32.66 kg/m       Objective:   Physical Exam  Constitutional: He is oriented to person, place, and time. He appears well-developed and well-nourished. No distress.  HENT:  Head: Normocephalic and atraumatic.  Right Ear: Hearing, tympanic membrane, external ear and ear canal normal.  Left Ear: Hearing, tympanic membrane, external ear and ear canal normal.  Nose: Mucosal edema present. No rhinorrhea. Right sinus exhibits maxillary sinus tenderness and frontal sinus tenderness. Left sinus exhibits maxillary sinus tenderness and frontal sinus tenderness.  Mouth/Throat: Uvula is midline and mucous membranes are normal. Oropharyngeal exudate present. No posterior oropharyngeal edema, posterior  oropharyngeal erythema or tonsillar abscesses.  Cardiovascular: Normal rate, regular rhythm, normal heart sounds and intact distal pulses.  Exam reveals no gallop and no friction rub.   No murmur heard. Pulmonary/Chest: Effort normal and breath sounds normal. No respiratory distress. He has no wheezes. He has no rales. He exhibits no tenderness.  Neurological: He is alert and oriented to person, place, and time.  Skin: Skin is warm and dry. No rash noted. He is not diaphoretic. No erythema. No pallor.  Psychiatric: He has a normal mood and affect. His behavior is normal. Judgment and thought content normal.  Nursing note and  vitals reviewed.     Assessment & Plan:  1. Acute upper respiratory infection - Seems to be improving. No need to abx therapy at this time.  - fluticasone (FLONASE) 50 MCG/ACT nasal spray; Place 2 sprays into both nostrils daily.  Dispense: 16 g; Refill: 6 - HYDROcodone-homatropine (HYCODAN) 5-1.5 MG/5ML syrup; Take 5 mLs by mouth every 8 (eight) hours as needed for cough.  Dispense: 120 mL; Refill: 0 - Follow up if no improvement in the next 2-3 days    2. Erectile dysfunction, unspecified erectile dysfunction type  - tadalafil (CIALIS) 5 MG tablet; Take 1 tablet (5 mg total) by mouth daily as needed for erectile dysfunction.  Dispense: 10 tablet; Refill: 0  Shirline Freesory Swetha Rayle, NP

## 2016-11-22 DIAGNOSIS — I471 Supraventricular tachycardia: Secondary | ICD-10-CM | POA: Diagnosis not present

## 2016-11-22 DIAGNOSIS — I421 Obstructive hypertrophic cardiomyopathy: Secondary | ICD-10-CM | POA: Diagnosis not present

## 2016-11-22 DIAGNOSIS — E668 Other obesity: Secondary | ICD-10-CM | POA: Diagnosis not present

## 2016-12-02 DIAGNOSIS — M9902 Segmental and somatic dysfunction of thoracic region: Secondary | ICD-10-CM | POA: Diagnosis not present

## 2016-12-02 DIAGNOSIS — M9903 Segmental and somatic dysfunction of lumbar region: Secondary | ICD-10-CM | POA: Diagnosis not present

## 2016-12-02 DIAGNOSIS — M546 Pain in thoracic spine: Secondary | ICD-10-CM | POA: Diagnosis not present

## 2016-12-02 DIAGNOSIS — M9901 Segmental and somatic dysfunction of cervical region: Secondary | ICD-10-CM | POA: Diagnosis not present

## 2016-12-09 DIAGNOSIS — M9903 Segmental and somatic dysfunction of lumbar region: Secondary | ICD-10-CM | POA: Diagnosis not present

## 2016-12-09 DIAGNOSIS — M9901 Segmental and somatic dysfunction of cervical region: Secondary | ICD-10-CM | POA: Diagnosis not present

## 2016-12-09 DIAGNOSIS — M9902 Segmental and somatic dysfunction of thoracic region: Secondary | ICD-10-CM | POA: Diagnosis not present

## 2016-12-09 DIAGNOSIS — M546 Pain in thoracic spine: Secondary | ICD-10-CM | POA: Diagnosis not present

## 2016-12-21 ENCOUNTER — Telehealth: Payer: Self-pay

## 2016-12-21 NOTE — Telephone Encounter (Signed)
Received PA request from [facility] for Cialis 5 mg tablets. PA submitted & is pending. Key: [id number]

## 2016-12-21 NOTE — Telephone Encounter (Signed)
PA denied. the use of medications for the treatment of sexual dysfunction or erectile dysfunction (ED)  are excluded from coverage under the plan

## 2016-12-21 NOTE — Telephone Encounter (Signed)
Dr. Kim patient 

## 2016-12-23 NOTE — Telephone Encounter (Signed)
Please let patient know. He can pay out of pocket.

## 2016-12-23 NOTE — Telephone Encounter (Signed)
I left a detailed message with the information below at the pts home number. 

## 2017-04-21 IMAGING — DX DG HIP (WITH OR WITHOUT PELVIS) 2-3V*R*
3 series · 3 of 3 positions shown · non-contrast
Comparison: None.

CLINICAL DATA: Status post fall 2 weeks ago striking the right hip
with persistent low back and right hip pain

EXAM:
DG HIP (WITH OR WITHOUT PELVIS) 2-3V RIGHT

[pelvis ap]
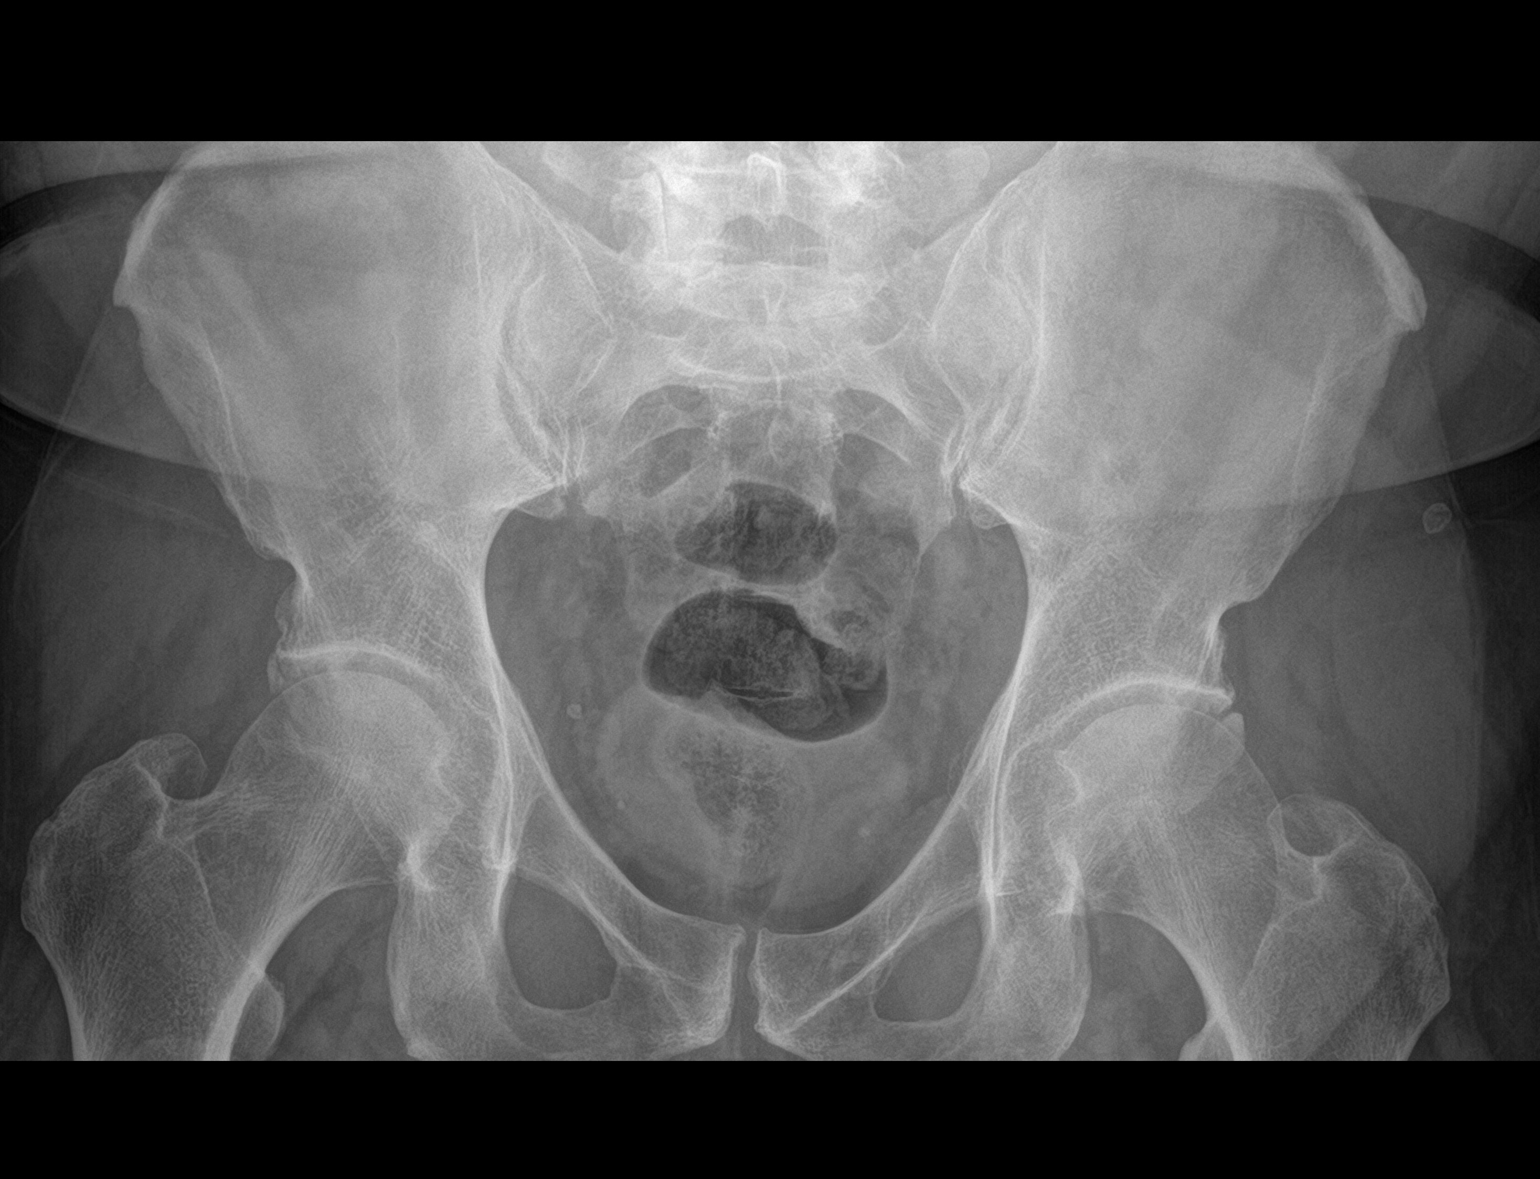

[hip ap]
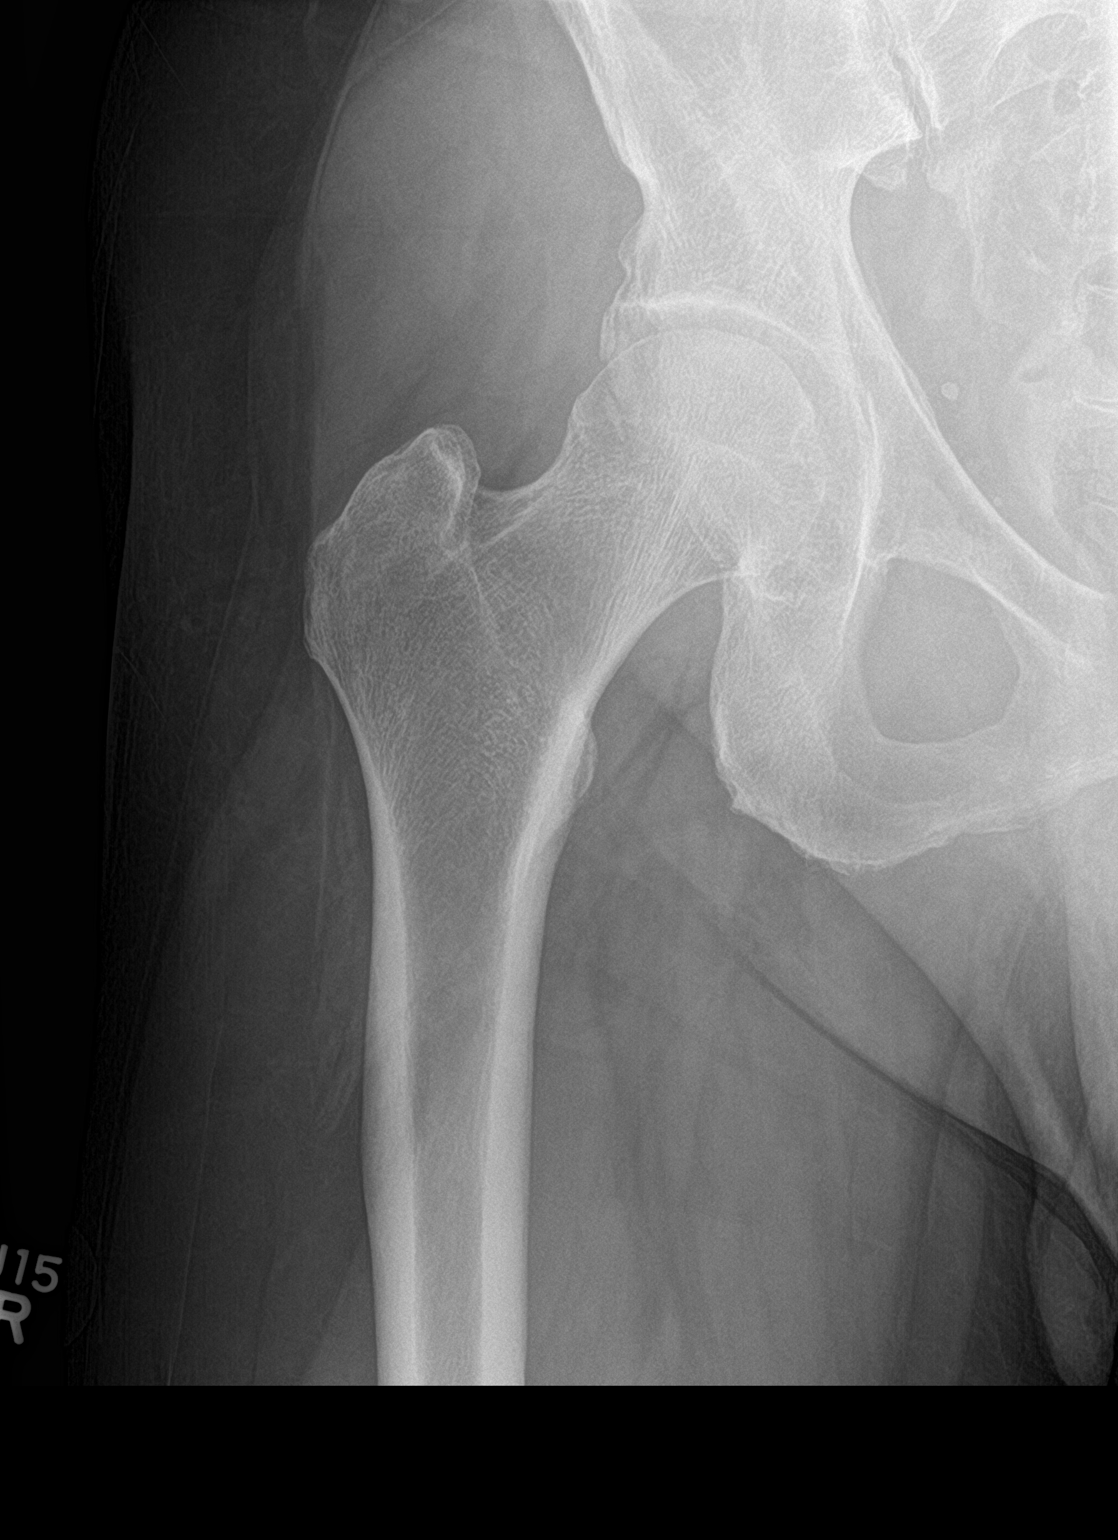

[hip lat]
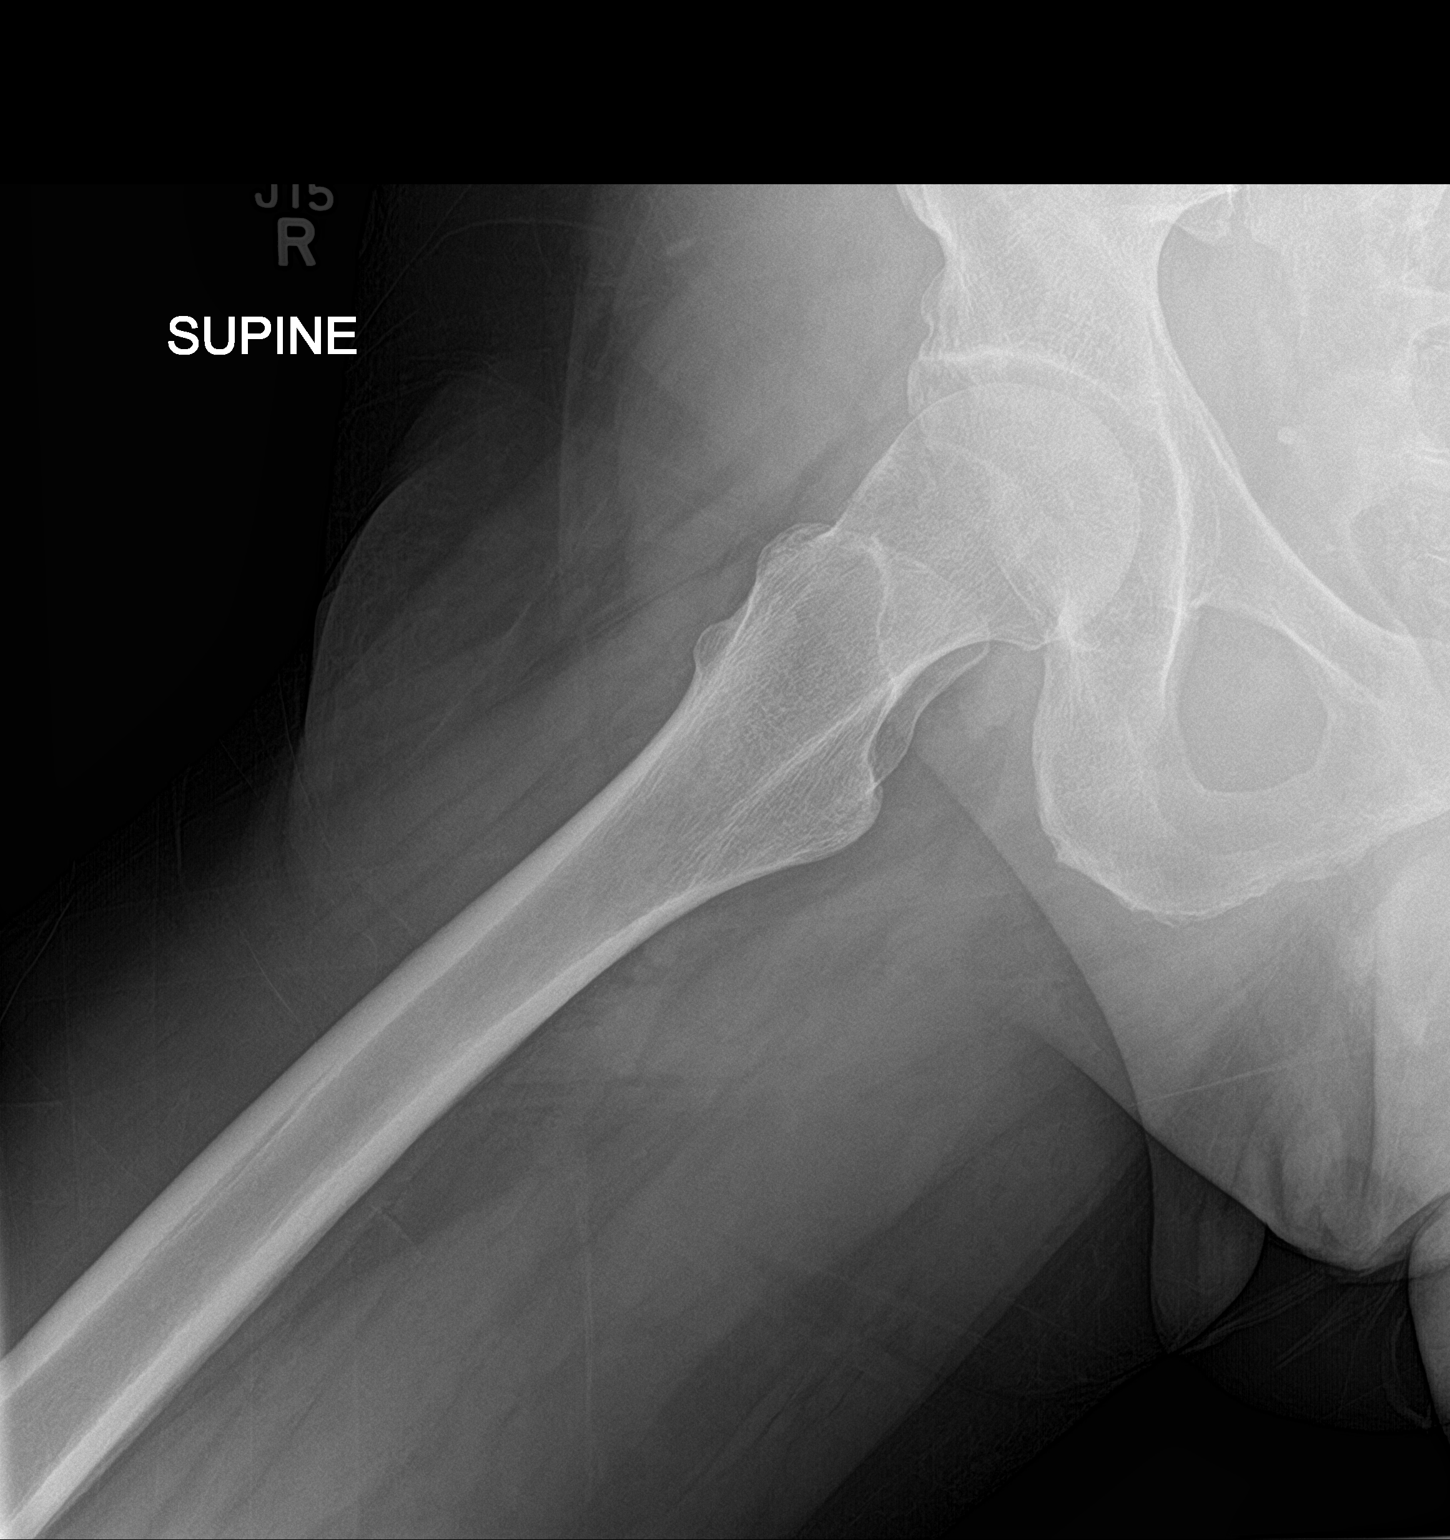

[3 of 3 positions shown; findings below may reference images not displayed]

FINDINGS: The bony pelvis is adequately mineralized. No acute or healing
fracture is observed. Specific attention to the right iliac crest
reveals no bony abnormality. AP and frog-leg lateral views of the
left hip reveal a normal joint space. The articular surfaces of the
femoral head and acetabulum remains smoothly rounded. The femoral
neck and intertrochanteric regions are normal.
IMPRESSION: There is no acute bony abnormality of the right hip or the pelvis.

## 2017-06-15 DIAGNOSIS — M5126 Other intervertebral disc displacement, lumbar region: Secondary | ICD-10-CM | POA: Diagnosis not present

## 2017-06-15 DIAGNOSIS — Z6832 Body mass index (BMI) 32.0-32.9, adult: Secondary | ICD-10-CM | POA: Diagnosis not present

## 2017-06-29 ENCOUNTER — Ambulatory Visit (INDEPENDENT_AMBULATORY_CARE_PROVIDER_SITE_OTHER): Payer: Federal, State, Local not specified - PPO | Admitting: Family Medicine

## 2017-06-29 ENCOUNTER — Encounter: Payer: Self-pay | Admitting: Family Medicine

## 2017-06-29 VITALS — BP 110/82 | HR 80 | Temp 98.3°F | Wt 184.0 lb

## 2017-06-29 DIAGNOSIS — W57XXXA Bitten or stung by nonvenomous insect and other nonvenomous arthropods, initial encounter: Secondary | ICD-10-CM

## 2017-06-29 DIAGNOSIS — S40861A Insect bite (nonvenomous) of right upper arm, initial encounter: Secondary | ICD-10-CM | POA: Diagnosis not present

## 2017-06-29 MED ORDER — DOXYCYCLINE HYCLATE 100 MG PO TABS: 100.0000 mg | ORAL_TABLET | Freq: Two times a day (BID) | ORAL | 0 refills | Status: AC

## 2017-06-29 MED ORDER — HYDROXYZINE HCL 10 MG PO TABS: 10.0000 mg | ORAL_TABLET | Freq: Three times a day (TID) | ORAL | 0 refills | Status: DC | PRN

## 2017-06-29 NOTE — Progress Notes (Signed)
Subjective:    Patient ID: Edward Curtis, male    DOB: 1955-12-22, 61 y.o.   MRN: [id number]  Chief Complaint  Patient presents with  . Insect Bite    itchy,burning sensation    HPI Patient was seen today for bug bite. -noticed Saturday night, woke up with a bump on R arm. -Pt did not see a spider, but is convinced he has a spider bite. -Tried benadryl spray w/ no relief.   -Endorses itching, burning, area feels worse at night, HA, N   Past Medical History:  Diagnosis Date  . Allergy    seasonal allergies   . Arthritis   . Asthma   . Blood transfusion without reported diagnosis 1985  . Calcium oxalate renal stones    early 1990's  . Chicken pox   . Chronic neck pain   . Depression   . GERD (gastroesophageal reflux disease)   . Hyperlipidemia 10/10/2013  . Hypertrophic obstructive cardiomyopathy(425.11) 10/10/2013  . IBS (irritable bowel syndrome) 12/07/2011  . Migraines   . Neuromuscular disorder (HCC)    radiculopathy left side   . Obesity (BMI 30-39.9) 10/10/2013  . Seasonal allergies     Past Surgical History:  Procedure Laterality Date  . left knee surgery  1990  . MOUTH SURGERY  1975/1993  . pre cancerous mole  2013  . prostate procedure  1980  . umbilical cyst  1985    Family History  Problem Relation Age of Onset  . Arthritis Mother   . Hyperlipidemia Mother   . Heart disease Mother   . Hypertension Mother   . Arthritis Paternal Grandmother   . Diabetes Paternal Grandmother   . Colon cancer Paternal Grandfather   . Heart disease Paternal Grandfather   . Diabetes Paternal Grandfather   . Hyperlipidemia Maternal Grandmother   . Heart disease Father   . Hypertension Father   . Diabetes Father   . Hypertension Unknown        both sets of parents  . Esophageal cancer Neg Hx   . Rectal cancer Neg Hx   . Stomach cancer Neg Hx     Social History   Social History  . Marital status: Single    Spouse name: N/A  . Number of children: N/A  . Years  of education: N/A   Occupational History  . Not on file.   Social History Main Topics  . Smoking status: Never Smoker  . Smokeless tobacco: Never Used  . Alcohol use 0.6 oz/week    1 Glasses of wine per week     Comment: once a month  . Drug use: No  . Sexual activity: Not Currently   Other Topics Concern  . Not on file   Social History Narrative  . No narrative on file    Outpatient Medications Prior to Visit  Medication Sig Dispense Refill  . acetaminophen (TYLENOL) 500 MG tablet Take 500 mg by mouth every 6 (six) hours as needed.    [name] albuterol (PROVENTIL HFA;VENTOLIN HFA) 108 (90 BASE) MCG/ACT inhaler Inhale 2 puffs into the lungs every 6 (six) hours as needed for wheezing or shortness of breath. Reported on 10/17/2015    . diphenhydrAMINE (BENADRYL) 25 mg capsule Take 50 mg by mouth every 6 (six) hours as needed.     . diphenhydramine-acetaminophen (TYLENOL PM) 25-500 MG TABS Take 1 tablet by mouth at bedtime as needed.    [name] EPINEPHrine (EPIPEN 2-PAK) 0.3 mg/0.3 mL SOAJ injection Inject 0.3 mLs (0.3  mg total) into the muscle once as needed (for severe allergic reaction). CAll 911 immediately if you have to use this medicine 1 Device 1  . HYDROcodone-homatropine (HYCODAN) 5-1.5 MG/5ML syrup Take 5 mLs by mouth every 8 (eight) hours as needed for cough. 120 mL 0  . naproxen (NAPROSYN) 500 MG tablet Take 500 mg by mouth 2 (two) times daily with a meal. Back pain    . oxycodone-acetaminophen (LYNOX) 10-300 MG per tablet Take 1 tablet by mouth every 4 (four) hours as needed.      . tadalafil (CIALIS) 5 MG tablet Take 1 tablet (5 mg total) by mouth daily as needed for erectile dysfunction. 10 tablet 0  . valACYclovir (VALTREX) 500 MG tablet Take 500 mg by mouth as needed.      . Vardenafil HCl 10 MG TBDP Take 10 mg by mouth daily. 10 tablet 2  . verapamil (COVERA HS) 180 MG (CO) 24 hr tablet Take 180 mg by mouth at bedtime.    . fluticasone (FLONASE) 50 MCG/ACT nasal spray Place 2  sprays into both nostrils daily. 16 g 6  . fluticasone (VERAMYST) 27.5 MCG/SPRAY nasal spray Place 2 sprays into the nose daily as needed for rhinitis. Reported on 10/17/2015    . tobramycin-dexamethasone (TOBRADEX) ophthalmic solution Place 2 drops into the left eye every 4 (four) hours while awake. 5 mL 0   No facility-administered medications prior to visit.     Allergies  Allergen Reactions  . Iodinated Diagnostic Agents Hives and Itching    Patient must be premedicated with 13 hour protocol prior to IV contrast administration  . Erythromycin     emycin and clindamycin with nausea  . Ibuprofen Other (See Comments)    Causes tachycardia  . Other Other (See Comments)     Walnuts-Sores/ulcers in mouth, swelling in tongue   . Penicillins Hives  . Sulfa Antibiotics Hives    ROS  General: Denies fever, chills, night sweats, changes in weight, changes in appetite HEENT: Denies ear pain, changes in vision, rhinorrhea, sore throat  +HA CV: Denies CP, palpitations, SOB, orthopnea Pulm: Denies SOB, cough, wheezing GI: Denies abdominal pain, vomiting, diarrhea, constipation  +Nausea GU: Denies dysuria, hematuria, frequency, vaginal discharge Msk: Denies muscle cramps, joint pains Neuro: Denies weakness, numbness, tingling Skin: Denies bruising   +bump/rash Psych: Denies depression, hallucinations  +anxiety      Objective:    Blood pressure 110/82, pulse 80, temperature 98.3 F (36.8 C), temperature source Oral, weight 184 lb (83.5 kg), SpO2 98 %.   Gen. Pleasant, well-nourished, in no distress, normal affect, slightly anxious. HEENT: [place]/AT, face symmetric, no scleral icterus, PERRLA, nares patent without drainage, pharynx without erythema or exudate. Lungs: no accessory muscle use, CTAB Cardiovascular: RRR, no m/r/g, no peripheral edema Musculoskeletal: No deformities, no cyanosis or clubbing, normal tone Neuro:  A&Ox3, CN II-XII intact, normal gait Skin:  Warm, dry, intact.    R upper arm with 5mm raised wheal with hives and mild erythema and warmth surrounding area.  No streaking.  No axillary lymphadenopathy noted.  Pt frequently touching area during exam, increasing erythema.   Wt Readings from Last 3 Encounters:  06/29/17 184 lb (83.5 kg)  11/03/16 214 lb 12.8 oz (97.4 kg)  10/31/15 225 lb 11.2 oz (102.4 kg)    Diabetic Foot Exam - Simple   No data filed     Lab Results  Component Value Date   WBC 9.1 09/04/2014   HGB 16.1 09/04/2014  HCT 47.8 09/04/2014   PLT 227.0 09/04/2014   GLUCOSE 87 09/04/2014   CHOL 262 (H) 09/04/2014   TRIG 317.0 (H) 09/04/2014   HDL 27.60 (L) 09/04/2014   LDLDIRECT 151.2 09/04/2014   LDLCALC 126 (H) 10/01/2013   ALT 28 10/01/2013   AST 25 10/01/2013   NA 140 09/04/2014   K 4.2 09/04/2014   CL 106 09/04/2014   CREATININE 1.0 09/04/2014   BUN 12 09/04/2014   CO2 29 09/04/2014   HGBA1C 5.4 09/04/2014    Assessment/Plan:  Insect bite, initial encounter Unclear what bit pt.  Discussed keeping area clean and dry.  Advised to avoid scratching/ touching area.  Hard to tell if erythema is increasing 2/2 cellulitis or frequent touching.  Will give abx. -Given handout and RTC precautions  - Plan: hydrOXYzine (ATARAX/VISTARIL) 10 MG tablet, doxycycline (VIBRA-TABS) 100 MG tablet.   Discussed possible photosensitivity.

## 2017-06-29 NOTE — Patient Instructions (Addendum)
Insect Bite, Adult An insect bite can make your skin red, itchy, and swollen. Some insects can spread disease to people with a bite. However, most insect bites do not lead to disease, and most are not serious. Follow these instructions at home: Bite area care  Do not scratch the bite area.  Keep the bite area clean and dry.  Wash the bite area every day with soap and water as told by your doctor.  Check the bite area every day for signs of infection. Check for: ? More redness, swelling, or pain. ? Fluid or blood. ? Warmth. ? Pus. Managing pain, itching, and swelling  You may put any of these on the bite area as told by your doctor: ? A baking soda paste. ? Cortisone cream. ? Calamine lotion.  If directed, put ice on the bite area. ? Put ice in a plastic bag. ? Place a towel between your skin and the bag. ? Leave the ice on for 20 minutes, 2-3 times a day. Medicines  Take medicines or put medicines on your skin only as told by your doctor.  If you were prescribed an antibiotic medicine, use it as told by your doctor. Do not stop using the antibiotic even if your condition improves. General instructions  Keep all follow-up visits as told by your doctor. This is important. How is this prevented? To help you have a lower risk of insect bites:  When you are outside, wear clothing that covers your arms and legs.  Use insect repellent. The best insect repellents have: ? An active ingredient of DEET, picaridin, oil of lemon eucalyptus (OLE), or IR3535. ? Higher amounts of DEET or another active ingredient than other repellents have.  If your home windows do not have screens, think about putting some in.  Contact a doctor if:  You have more redness, swelling, or pain in the bite area.  You have fluid, blood, or pus coming from the bite area.  The bite area feels warm.  You have a fever. Get help right away if:  You have joint pain.  You have a rash.  You have  shortness of breath.  You feel more tired or sleepy than you normally do.  You have neck pain.  You have a headache.  You feel weaker than you normally do.  You have chest pain.  You have pain in your belly.  You feel sick to your stomach (nauseous) or you throw up (vomit). Summary  An insect bite can make your skin red, itchy, and swollen.  Do not scratch the bite area, and keep it clean and dry.  Ice can help with pain and itching from the bite. This information is not intended to replace advice given to you by your health care provider. Make sure you discuss any questions you have with your health care provider. Document Released: 10/08/2000 Document Revised: 05/13/2016 Document Reviewed: 02/26/2015 Elsevier Interactive Patient Education  2018 [employer]  Spider Bite Spider bites are not common. Most spider bites do not cause serious problems. There are only a few types of spider bites that can cause serious health problems. Follow these instructions at home: Medicine  Take or apply over-the-counter and prescription medicines only as told by your doctor.  If you were given an antibiotic medicine, take or apply it as told by your doctor. Do not stop using the antibiotic even if your condition improves. General instructions  Do not scratch the bite area.  Keep the bite area  clean and dry. Wash the bite area with soap and water every day as told by your doctor.  If directed, apply ice to the bite area. ? Put ice in a plastic bag. ? Place a towel between your skin and the bag. ? Leave the ice on for 20 minutes, 2-3 times per day.  Raise (elevate) the affected area above the level of your heart while you are sitting or lying down, if this is possible.  Keep all follow-up visits as told by your doctor. This is important. Contact a doctor if:  Your bite does not get better after 3 days.  Your bite turns black or purple.  Near the bite, you  have: ? Redness. ? Swelling (inflammation). ? Pain that is getting worse. Get help right away if:  You get shortness of breath or chest pain.  You have fluid, blood, or pus coming from the bite area.  You have muscle cramps or painful muscle spasms.  You have stomach (abdominal) pain.  You feel sick to your stomach (nauseous) or you throw up (vomit).  You feel more tired or sleepy than you normally do. This information is not intended to replace advice given to you by your health care provider. Make sure you discuss any questions you have with your health care provider. Document Released: 11/13/2010 Document Revised: 06/07/2016 Document Reviewed: 02/26/2015 Elsevier Interactive Patient Education  [employer].

## 2017-07-01 ENCOUNTER — Ambulatory Visit (INDEPENDENT_AMBULATORY_CARE_PROVIDER_SITE_OTHER): Payer: Federal, State, Local not specified - PPO | Admitting: Family Medicine

## 2017-07-01 ENCOUNTER — Encounter: Payer: Self-pay | Admitting: Family Medicine

## 2017-07-01 VITALS — BP 100/64 | HR 70 | Temp 98.2°F | Ht 68.0 in | Wt 185.0 lb

## 2017-07-01 DIAGNOSIS — R21 Rash and other nonspecific skin eruption: Secondary | ICD-10-CM | POA: Diagnosis not present

## 2017-07-01 NOTE — Patient Instructions (Signed)
-  continue the antibiotic  -stop hydroxyzine and take zyrtec or pepcid once daily  -topical hydrocortisone cream  I hope you are feeling better soon! Seek care immediately if worsening, new concerns or you are not improving with treatment.

## 2017-07-01 NOTE — Progress Notes (Signed)
HPI:  Acute visit for skin lesion R arm: -started about 6 days ago -located upper R arm -seen a few days ago by another provider and treated with doxy and hydroxyzine -reports has had intense pruritis and that area was improving then looked more erythematous this morning and now better again -denies fevers, malaise, tick bite, swelling, drainage, rash elsewhere  ROS: See pertinent positives and negatives per HPI.  Past Medical History:  Diagnosis Date  . Allergy    seasonal allergies   . Arthritis   . Asthma   . Blood transfusion without reported diagnosis 1985  . Calcium oxalate renal stones    early 1990's  . Chicken pox   . Chronic neck pain   . Depression   . GERD (gastroesophageal reflux disease)   . Hyperlipidemia 10/10/2013  . Hypertrophic obstructive cardiomyopathy(425.11) 10/10/2013  . IBS (irritable bowel syndrome) 12/07/2011  . Migraines   . Neuromuscular disorder (HCC)    radiculopathy left side   . Obesity (BMI 30-39.9) 10/10/2013  . Seasonal allergies     Past Surgical History:  Procedure Laterality Date  . left knee surgery  1990  . MOUTH SURGERY  1975/1993  . pre cancerous mole  2013  . prostate procedure  1980  . umbilical cyst  1985    Family History  Problem Relation Age of Onset  . Arthritis Mother   . Hyperlipidemia Mother   . Heart disease Mother   . Hypertension Mother   . Arthritis Paternal Grandmother   . Diabetes Paternal Grandmother   . Colon cancer Paternal Grandfather   . Heart disease Paternal Grandfather   . Diabetes Paternal Grandfather   . Hyperlipidemia Maternal Grandmother   . Heart disease Father   . Hypertension Father   . Diabetes Father   . Hypertension Unknown        both sets of parents  . Esophageal cancer Neg Hx   . Rectal cancer Neg Hx   . Stomach cancer Neg Hx     Social History   Social History  . Marital status: Single    Spouse name: N/A  . Number of children: N/A  . Years of education: N/A    Social History Main Topics  . Smoking status: Never Smoker  . Smokeless tobacco: Never Used  . Alcohol use 0.6 oz/week    1 Glasses of wine per week     Comment: once a month  . Drug use: No  . Sexual activity: Not Currently   Other Topics Concern  . None   Social History Narrative  . None     Current Outpatient Prescriptions:  .  acetaminophen (TYLENOL) 500 MG tablet, Take 500 mg by mouth every 6 (six) hours as needed., Disp: , Rfl:  .  albuterol (PROVENTIL HFA;VENTOLIN HFA) 108 (90 BASE) MCG/ACT inhaler, Inhale 2 puffs into the lungs every 6 (six) hours as needed for wheezing or shortness of breath. Reported on 10/17/2015, Disp: , Rfl:  .  diphenhydrAMINE (BENADRYL) 25 mg capsule, Take 50 mg by mouth every 6 (six) hours as needed. , Disp: , Rfl:  .  diphenhydramine-acetaminophen (TYLENOL PM) 25-500 MG TABS, Take 1 tablet by mouth at bedtime as needed., Disp: , Rfl:  .  doxycycline (VIBRA-TABS) 100 MG tablet, Take 1 tablet (100 mg total) by mouth 2 (two) times daily., Disp: 20 tablet, Rfl: 0 .  EPINEPHrine (EPIPEN 2-PAK) 0.3 mg/0.3 mL SOAJ injection, Inject 0.3 mLs (0.3 mg total) into the muscle once as needed (  for severe allergic reaction). CAll 911 immediately if you have to use this medicine, Disp: 1 Device, Rfl: 1 .  HYDROcodone-homatropine (HYCODAN) 5-1.5 MG/5ML syrup, Take 5 mLs by mouth every 8 (eight) hours as needed for cough., Disp: 120 mL, Rfl: 0 .  hydrOXYzine (ATARAX/VISTARIL) 10 MG tablet, Take 1 tablet (10 mg total) by mouth 3 (three) times daily as needed., Disp: 30 tablet, Rfl: 0 .  naproxen (NAPROSYN) 500 MG tablet, Take 500 mg by mouth 2 (two) times daily with a meal. Back pain, Disp: , Rfl:  .  oxycodone-acetaminophen (LYNOX) 10-300 MG per tablet, Take 1 tablet by mouth every 4 (four) hours as needed.  , Disp: , Rfl:  .  tadalafil (CIALIS) 5 MG tablet, Take 1 tablet (5 mg total) by mouth daily as needed for erectile dysfunction., Disp: 10 tablet, Rfl: 0 .   valACYclovir (VALTREX) 500 MG tablet, Take 500 mg by mouth as needed.  , Disp: , Rfl:  .  Vardenafil HCl 10 MG TBDP, Take 10 mg by mouth daily., Disp: 10 tablet, Rfl: 2 .  verapamil (COVERA HS) 180 MG (CO) 24 hr tablet, Take 180 mg by mouth at bedtime., Disp: , Rfl:   EXAM:  Vitals:   07/01/17 1608  BP: 100/64  Pulse: 70  Temp: 98.2 F (36.8 C)    Body mass index is 28.13 kg/m.  GENERAL: vitals reviewed and listed above, alert, oriented, appears well hydrated and in no acute distress  HEENT: atraumatic, conjunttiva clear, no obvious abnormalities on inspection of external nose and ears  NECK: no obvious masses on inspection  LUNGS: clear to auscultation bilaterally, no wheezes, rales or rhonchi, good air movement  CV: HRRR, no peripheral edema  SKIN: small patch of erythematous papules R upper arm, no drainage, induration or lymphangitis  MS: moves all extremities without noticeable abnormality  PSYCH: pleasant and cooperative, no obvious depression or anxiety  ASSESSMENT AND PLAN:  Discussed the following assessment and plan:  Skin rash  -we discussed possible serious and likely etiologies, workup and treatment, treatment risks and return precautions - query local reaction to insect bite -after this discussion, Edward Curtis opted for continuation abx, topical hydrocortisone cream, change to zyrtec or pepcid or cont hydroxyzine -follow up advised as needed -of course, we advised Edward Curtis  to return or notify a doctor immediately if symptoms worsen or persist or new concerns arise.  Patient Instructions  -continue the antibiotic  -stop hydroxyzine and take zyrtec or pepcid once daily  -topical hydrocortisone cream  I hope you are feeling better soon! Seek care immediately if worsening, new concerns or you are not improving with treatment.     Kriste Basque R., DO

## 2017-07-11 ENCOUNTER — Telehealth: Payer: Self-pay | Admitting: Family Medicine

## 2017-07-11 ENCOUNTER — Ambulatory Visit (INDEPENDENT_AMBULATORY_CARE_PROVIDER_SITE_OTHER): Payer: Federal, State, Local not specified - PPO | Admitting: Family Medicine

## 2017-07-11 ENCOUNTER — Encounter: Payer: Self-pay | Admitting: Family Medicine

## 2017-07-11 VITALS — Temp 98.3°F | Ht 68.0 in | Wt 183.0 lb

## 2017-07-11 DIAGNOSIS — L237 Allergic contact dermatitis due to plants, except food: Secondary | ICD-10-CM | POA: Diagnosis not present

## 2017-07-11 MED ORDER — METHYLPREDNISOLONE 4 MG PO TBPK: ORAL_TABLET | ORAL | 0 refills | Status: DC

## 2017-07-11 NOTE — Patient Instructions (Signed)
WE NOW OFFER   [facility] FAST TRACK!!!  SAME DAY Appointments for ACUTE CARE  Such as: Sprains, Injuries, cuts, abrasions, rashes, muscle pain, joint pain, back pain Colds, flu, sore throats, headache, allergies, cough, fever  Ear pain, sinus and eye infections Abdominal pain, nausea, vomiting, diarrhea, upset stomach Animal/insect bites  3 Easy Ways to Schedule: Walk-In Scheduling Call in scheduling Mychart Sign-up: [email or web address]         

## 2017-07-11 NOTE — Telephone Encounter (Signed)
Yes I would be happy to see him  

## 2017-07-11 NOTE — Progress Notes (Signed)
   Subjective:    Patient ID: Edward Curtis, male    DOB: 12/19/1955, 61 y.o.   MRN: [id number]  HPI Here to recheck a rash on both upper arms that started about 2 weeks ago. This started a few days after he was pulling weeds in his yard. It began on the right upper arm and then also involved the left upper arm. The rash is red and it itches intensely. It had a number of small bumps at first but now is more flat. He took a round of Doxycycline with no improvement. He has been taking hydroxyzine and Benadryl.    Review of Systems  Constitutional: Negative.   Respiratory: Negative.   Cardiovascular: Negative.   Skin: Positive for rash.       Objective:   Physical Exam  Constitutional: He is oriented to person, place, and time. He appears well-developed and well-nourished.  Cardiovascular: Normal rate, regular rhythm, normal heart sounds and intact distal pulses.   Pulmonary/Chest: Effort normal and breath sounds normal. No respiratory distress. He has no wheezes. He has no rales.  Neurological: He is alert and oriented to person, place, and time.  Skin:  Maculopapular patches of skin on both upper arms and in both antecubital areas           Assessment & Plan:  Contact dermatitis. He is given a Medrol dose pack. Recheck prn.  Gershon Crane, MD

## 2017-07-11 NOTE — Telephone Encounter (Signed)
° ° ° ° °  Pt would like to transfer from Dr Selena Batten to Dr Clent Ridges will this be ok

## 2017-07-12 NOTE — Telephone Encounter (Signed)
Okay with me 

## 2017-07-14 ENCOUNTER — Encounter: Payer: Self-pay | Admitting: Family Medicine

## 2017-07-18 ENCOUNTER — Ambulatory Visit: Payer: Federal, State, Local not specified - PPO | Admitting: Family Medicine

## 2017-07-19 ENCOUNTER — Ambulatory Visit (INDEPENDENT_AMBULATORY_CARE_PROVIDER_SITE_OTHER): Payer: Federal, State, Local not specified - PPO | Admitting: Family Medicine

## 2017-07-19 ENCOUNTER — Encounter: Payer: Self-pay | Admitting: Family Medicine

## 2017-07-19 ENCOUNTER — Ambulatory Visit: Payer: Federal, State, Local not specified - PPO | Admitting: Family Medicine

## 2017-07-19 VITALS — BP 100/71 | HR 92 | Temp 98.8°F | Ht 68.0 in | Wt 179.0 lb

## 2017-07-19 DIAGNOSIS — L237 Allergic contact dermatitis due to plants, except food: Secondary | ICD-10-CM | POA: Diagnosis not present

## 2017-07-19 MED ORDER — PREDNISONE 10 MG PO TABS: ORAL_TABLET | ORAL | 0 refills | Status: DC

## 2017-07-19 NOTE — Patient Instructions (Signed)
WE NOW OFFER   [facility] FAST TRACK!!!  SAME DAY Appointments for ACUTE CARE  Such as: Sprains, Injuries, cuts, abrasions, rashes, muscle pain, joint pain, back pain Colds, flu, sore throats, headache, allergies, cough, fever  Ear pain, sinus and eye infections Abdominal pain, nausea, vomiting, diarrhea, upset stomach Animal/insect bites  3 Easy Ways to Schedule: Walk-In Scheduling Call in scheduling Mychart Sign-up: [email or web address]         

## 2017-07-19 NOTE — Progress Notes (Signed)
   Subjective:    Patient ID: Edward Curtis, male    DOB: 07-Feb-1956, 61 y.o.   MRN: [id number]  HPI Here to follow up on itchy rashes on both arms. He was seen here on 07-11-17 for this, and we felt he had a contact dermatitis. He was given a Medrol dose pack , and he improved a lot for the first few days. Then as the dose pack tapered down the rash returned. He now has itchy patches on the arms again though they are not as bad as the first time.    Review of Systems  Constitutional: Negative.   Respiratory: Negative.   Cardiovascular: Negative.   Skin: Positive for rash.       Objective:   Physical Exam  Constitutional: He appears well-developed and well-nourished.  Cardiovascular: Normal rate, regular rhythm, normal heart sounds and intact distal pulses.   Pulmonary/Chest: Effort normal and breath sounds normal. No respiratory distress. He has no wheezes. He has no rales.  Skin:  Both arms have faint pink macular areas           Assessment & Plan:  Partially treated contact dermatitis. This time we will treat him with a taper of prednisone, starting at a higher dose and going over 30 days.  Gershon Crane, MD

## 2017-08-20 ENCOUNTER — Ambulatory Visit (INDEPENDENT_AMBULATORY_CARE_PROVIDER_SITE_OTHER): Payer: Federal, State, Local not specified - PPO | Admitting: Family Medicine

## 2017-08-20 ENCOUNTER — Encounter: Payer: Self-pay | Admitting: Family Medicine

## 2017-08-20 ENCOUNTER — Telehealth: Payer: Self-pay | Admitting: Family Medicine

## 2017-08-20 DIAGNOSIS — S0031XA Abrasion of nose, initial encounter: Secondary | ICD-10-CM

## 2017-08-20 DIAGNOSIS — B86 Scabies: Secondary | ICD-10-CM | POA: Diagnosis not present

## 2017-08-20 MED ORDER — MUPIROCIN CALCIUM 2 % NA OINT: 1.0000 "application " | TOPICAL_OINTMENT | Freq: Two times a day (BID) | NASAL | 0 refills | Status: DC

## 2017-08-20 MED ORDER — MUPIROCIN 2 % EX OINT: 1.0000 "application " | TOPICAL_OINTMENT | Freq: Two times a day (BID) | CUTANEOUS | 0 refills | Status: DC

## 2017-08-20 MED ORDER — PERMETHRIN 5 % EX CREA: 1.0000 "application " | TOPICAL_CREAM | Freq: Once | CUTANEOUS | 0 refills | Status: AC

## 2017-08-20 NOTE — Assessment & Plan Note (Addendum)
Distribution of rash is concerning for scabies particularly with the parents. We'll treat with permethrin. Discussed considering exterminator at his house. Discussed appropriate use of permethrin. If not improving he should be reevaluated. Consider dermatology evaluation if this does not improve.

## 2017-08-20 NOTE — Patient Instructions (Signed)
Nice to see you. Your rash is concerning for scabies. We'll treat with permethrin. Please follow the directions. If this does not improve please let us know. You can use the mupirocin ointment in your right nostril at the area of irritation at the front of your nostril. Please try to use nasal saline to help with the nosebleeds. If your nose bleeding does not improve please be evaluated.

## 2017-08-20 NOTE — Progress Notes (Signed)
  Marikay AlarEric Nilo Fallin, MD Phone: [phone]  Evalee JeffersonRichard Curtis is a 61 y.o. male who presents today for same-day visit.  Patient seen on 2 previous occasions by his PCPs office for rash. Felt to be allergic contact dermatitis possibly to poison ivy or poison oak. He was treated with a month-long taper of prednisone and does note that did help some though never completely went away and has worsened again. He notes the rash is on his bilateral elbows and near his bilateral axillae. Also notes slight rash at the top of his gluteal cleft. It itches. He's not had any travel or close contacts with similar rash. He notes no specific exposures to poison ivy or poison oak. He did have lesions on his fingers previously.   Patient also notes the right anterior aspect of his nostril has been sore. He does note some nasal bleeding that is mixed in with mucus when he blows his nose. He notes no significant respiratory congestion. No excessive bleeding from his nose. He's been using over-the-counter antibiotic ointment for this.  PMH: nonsmoker.   ROS see history of present illness  Objective  Physical Exam Vitals:   08/20/17 1226 08/20/17 1259  BP: 100/62   Pulse: (!) 114 96  Resp: 16   Temp: 98.3 F (36.8 C)   SpO2: 98%     BP Readings from Last 3 Encounters:  08/20/17 100/62  07/19/17 100/71  07/01/17 100/64   Wt Readings from Last 3 Encounters:  08/20/17 177 lb (80.3 kg)  07/19/17 179 lb (81.2 kg)  07/11/17 183 lb (83 kg)    Physical Exam  Constitutional: No distress.  HENT:  No specific sore noted in the anterior nostril, there is an area of scabbing and dried blood over the anterior medial nasal septum on the right with no active bleeding, left anterior nasal cavity appears normal  Cardiovascular: Normal rate, regular rhythm and normal heart sounds.   Pulmonary/Chest: Effort normal and breath sounds normal.  Skin: He is not diaphoretic.  Patient with papular erythematous excoriated rash  on bilateral lateral elbows and bilateral. Just inferior to bilateral axillae, 2 small erythematous excoriated papules just superior to his gluteal cleft     Assessment/Plan: Please see individual problem list.  Scabies Distribution of rash is concerning for scabies particularly with the parents. We'll treat with permethrin. Discussed considering exterminator at his house. Discussed appropriate use of permethrin. If not improving he should be reevaluated. Consider dermatology evaluation if this does not improve.  Nasal abrasion Suspect likely abrasion of nostril. Does have evidence of irritation of the anterior nasal septum with evidence of prior bleeding. Discussed use of mupirocin ointment. Nasal saline spray as well. We'll try not to irritate his nose. If not improving consider ENT evaluation.   No orders of the defined types were placed in this encounter.   Meds ordered this encounter  Medications  . permethrin (ELIMITE) 5 % cream    Sig: Apply 1 application topically once. Head to toes and leave in place for 8-12 hours then wash off.    Dispense:  60 g    Refill:  0  . DISCONTD: mupirocin nasal ointment (BACTROBAN) 2 %    Sig: Place 1 application into the nose 2 (two) times daily. In right nostril at site of irritation    Dispense:  10 g    Refill:  0    Marikay AlarEric Ronni Osterberg, MD [facility] [place]

## 2017-08-20 NOTE — Assessment & Plan Note (Signed)
Suspect likely abrasion of nostril. Does have evidence of irritation of the anterior nasal septum with evidence of prior bleeding. Discussed use of mupirocin ointment. Nasal saline spray as well. We'll try not to irritate his nose. If not improving consider ENT evaluation.

## 2017-08-20 NOTE — Telephone Encounter (Signed)
Received call from [facility] regarding the prescription I sent in earlier from Saturday clinic. They do not carry the mupirocin ointment for nasal use though they due to carry the normal mupirocin ointment which can be used in the nose per the pharmacist. This was sent to his pharmacy.

## 2017-08-26 ENCOUNTER — Telehealth: Payer: Self-pay | Admitting: Family Medicine

## 2017-08-26 MED ORDER — PERMETHRIN 5 % EX CREA: 1.0000 "application " | TOPICAL_CREAM | Freq: Once | CUTANEOUS | 0 refills | Status: AC

## 2017-08-26 NOTE — Telephone Encounter (Signed)
Patient notified

## 2017-08-26 NOTE — Telephone Encounter (Signed)
Patient states he is having new areas appear and would like refill sent to pharmacy for second treatment, informed patient to call back after 2 weeks of treatment if it has not cleared up but patient states he wants a refill now to use in two weeks.

## 2017-08-26 NOTE — Telephone Encounter (Signed)
Sent to pharmacy. Patient should not use this prior to November 10. If he continues to have issues following that he needs to see dermatology.

## 2017-08-26 NOTE — Telephone Encounter (Signed)
Copied from CRM #[id number]. Topic: Quick Communication - See Telephone Encounter >> Aug 26, 2017 10:01 AM Eston Mouldavis, Manroop Jakubowicz B wrote: CRM for notification. See Telephone encounter for:  08/26/17. refill on temethrin cream

## 2017-08-26 NOTE — Telephone Encounter (Signed)
Pt. Requesting refill. Still having rash.

## 2017-08-26 NOTE — Telephone Encounter (Signed)
Patient needs to wait 2 weeks from his first treatment prior to repeating treatment. He does not need a repeat treatment at this time as it has been less than a week. If he continues to have issues could consider referring to dermatology if he would like.

## 2017-08-26 NOTE — Telephone Encounter (Signed)
Please advise 

## 2017-08-30 DIAGNOSIS — J301 Allergic rhinitis due to pollen: Secondary | ICD-10-CM | POA: Diagnosis not present

## 2017-08-30 DIAGNOSIS — J3081 Allergic rhinitis due to animal (cat) (dog) hair and dander: Secondary | ICD-10-CM | POA: Diagnosis not present

## 2017-08-30 DIAGNOSIS — J3089 Other allergic rhinitis: Secondary | ICD-10-CM | POA: Diagnosis not present

## 2017-08-30 DIAGNOSIS — R21 Rash and other nonspecific skin eruption: Secondary | ICD-10-CM | POA: Diagnosis not present

## 2017-10-21 DIAGNOSIS — B079 Viral wart, unspecified: Secondary | ICD-10-CM | POA: Diagnosis not present

## 2017-10-21 DIAGNOSIS — M79672 Pain in left foot: Secondary | ICD-10-CM | POA: Diagnosis not present

## 2017-10-21 DIAGNOSIS — M19072 Primary osteoarthritis, left ankle and foot: Secondary | ICD-10-CM | POA: Diagnosis not present

## 2017-10-28 DIAGNOSIS — Z79899 Other long term (current) drug therapy: Secondary | ICD-10-CM | POA: Diagnosis not present

## 2017-10-28 DIAGNOSIS — L03032 Cellulitis of left toe: Secondary | ICD-10-CM | POA: Diagnosis not present

## 2017-11-11 DIAGNOSIS — M792 Neuralgia and neuritis, unspecified: Secondary | ICD-10-CM | POA: Diagnosis not present

## 2017-11-16 ENCOUNTER — Ambulatory Visit: Payer: Federal, State, Local not specified - PPO | Admitting: Family Medicine

## 2017-11-16 ENCOUNTER — Encounter: Payer: Self-pay | Admitting: Family Medicine

## 2017-11-16 VITALS — BP 100/62 | HR 84 | Temp 98.1°F | Wt 200.0 lb

## 2017-11-16 DIAGNOSIS — L659 Nonscarring hair loss, unspecified: Secondary | ICD-10-CM | POA: Diagnosis not present

## 2017-11-16 NOTE — Progress Notes (Signed)
   Subjective:    Patient ID: Edward Curtis, male    DOB: 08-08-1956, 62 y.o.   MRN: [id number]  HPI Here to discuss hair loss. He has always had a thick head of hair, but about 2 months ago he suddenly started to find a lot of hair in the shower over the drain and his hair is thinner. No scalp rashes or irritations. He is worried about possible thyroid disorders. I ask about recent stressors in his life and in fact he had a very stressful event several weeks before he noticed the hair loss. His brother committed suicide, and Edward Curtis was the one to find the body. He has slowly been working to get over this.    Review of Systems  Constitutional: Negative.   Respiratory: Negative.   Cardiovascular: Negative.   Skin: Negative.   Neurological: Negative.   Psychiatric/Behavioral: Positive for dysphoric mood. The patient is nervous/anxious.        Objective:   Physical Exam  Constitutional: He is oriented to person, place, and time. He appears well-developed and well-nourished.  Cardiovascular: Normal rate, regular rhythm, normal heart sounds and intact distal pulses.  Pulmonary/Chest: Effort normal and breath sounds normal. No respiratory distress. He has no wheezes. He has no rales.  Neurological: He is alert and oriented to person, place, and time.  Skin:  His hair and scalp appear normal           Assessment & Plan:  Hair loss, likely due to stress. Screen with labs to rule out metabolic causes. Recheck prn.  Gershon CraneStephen Lucia Mccreadie, MD

## 2017-11-17 LAB — CBC WITH DIFFERENTIAL/PLATELET
BASOS PCT: 1.1 % (ref 0.0–3.0)
Basophils Absolute: 0.1 10*3/uL (ref 0.0–0.1)
EOS ABS: 0.1 10*3/uL (ref 0.0–0.7)
EOS PCT: 1.7 % (ref 0.0–5.0)
HCT: 44.7 % (ref 39.0–52.0)
Hemoglobin: 15.3 g/dL (ref 13.0–17.0)
LYMPHS ABS: 2 10*3/uL (ref 0.7–4.0)
Lymphocytes Relative: 28.9 % (ref 12.0–46.0)
MCHC: 34.2 g/dL (ref 30.0–36.0)
MCV: 95.3 fl (ref 78.0–100.0)
Monocytes Absolute: 0.5 10*3/uL (ref 0.1–1.0)
Monocytes Relative: 7.7 % (ref 3.0–12.0)
NEUTROS PCT: 60.6 % (ref 43.0–77.0)
Neutro Abs: 4.1 10*3/uL (ref 1.4–7.7)
Platelets: 224 10*3/uL (ref 150.0–400.0)
RBC: 4.69 Mil/uL (ref 4.22–5.81)
RDW: 13.5 % (ref 11.5–15.5)
WBC: 6.7 10*3/uL (ref 4.0–10.5)

## 2017-11-17 LAB — BASIC METABOLIC PANEL
BUN: 11 mg/dL (ref 6–23)
CHLORIDE: 106 meq/L (ref 96–112)
CO2: 27 meq/L (ref 19–32)
CREATININE: 0.8 mg/dL (ref 0.40–1.50)
Calcium: 9.6 mg/dL (ref 8.4–10.5)
GFR: 103.83 mL/min (ref >60.00)
Glucose, Bld: 84 mg/dL (ref 70–99)
POTASSIUM: 4.2 meq/L (ref 3.5–5.1)
Sodium: 142 mEq/L (ref 135–145)

## 2017-11-17 LAB — HEPATIC FUNCTION PANEL
ALT: 9 U/L (ref 0–53)
AST: 17 U/L (ref 0–37)
Albumin: 4.5 g/dL (ref 3.5–5.2)
Alkaline Phosphatase: 59 U/L (ref 39–117)
BILIRUBIN TOTAL: 0.5 mg/dL (ref 0.2–1.2)
Bilirubin, Direct: 0.1 mg/dL (ref 0.0–0.3)
TOTAL PROTEIN: 6.7 g/dL (ref 6.0–8.3)

## 2017-11-17 LAB — TSH: TSH: 3.77 u[IU]/mL (ref 0.35–4.50)

## 2017-11-17 LAB — TESTOSTERONE: Testosterone: 205.5 ng/dL — ABNORMAL LOW (ref 300.00–890.00)

## 2017-11-21 ENCOUNTER — Telehealth: Payer: Self-pay | Admitting: Family Medicine

## 2017-11-21 NOTE — Telephone Encounter (Signed)
Copied from CRM [phone]. Topic: Quick Communication - Lab Results >> Nov 21, 2017  1:28 PM Edward Curtis, Rosey Batheresa D wrote: Patient called to check the status of his lab results from last week 11/16/17. Please call patient back, thanks.

## 2017-11-21 NOTE — Telephone Encounter (Signed)
Called pt back and left a detailed VM per DPR. Advised pt to call back once they find out what their insurance will cover so we can send in medication to treat the low Testerone level.

## 2017-11-22 ENCOUNTER — Telehealth: Payer: Self-pay | Admitting: Family Medicine

## 2017-11-22 NOTE — Telephone Encounter (Signed)
Copied from CRM [id number]. Topic: Quick Communication - See Telephone Encounter >> Nov 22, 2017  9:27 AM Guinevere FerrariMorris, Duvan Mousel E, NT wrote: CRM for notification. See Telephone encounter for: Patient called back and said the doctor said he was going to give him medication for Testerone and he was told that the doctor have to get pre authorization on whatever medication that he was going to prescribe.  The number for pre authorization is [phone] option 2. Patient also said that the reason has to be stated. [facility] uses  [facility] - [place], [place] - [address] [phone] (Phone) [phone] (Fax)     11/22/17.

## 2017-11-24 ENCOUNTER — Telehealth: Payer: Self-pay | Admitting: Family Medicine

## 2017-11-24 NOTE — Telephone Encounter (Signed)
Copied from CRM [id number]. Topic: Inquiry >> Nov 24, 2017  5:16 PM Stephannie LiSimmons, Genavie Boettger L, NT wrote: Reason for CRM: Patient is calling to follow up in reference to prior  authorization for medication  [facility], he would like a call from the practice with information ,  please call [phone]

## 2017-11-25 ENCOUNTER — Encounter: Payer: Self-pay | Admitting: Family Medicine

## 2017-11-25 ENCOUNTER — Ambulatory Visit: Payer: Federal, State, Local not specified - PPO | Admitting: Family Medicine

## 2017-11-25 DIAGNOSIS — E291 Testicular hypofunction: Secondary | ICD-10-CM | POA: Insufficient documentation

## 2017-11-25 DIAGNOSIS — J209 Acute bronchitis, unspecified: Secondary | ICD-10-CM | POA: Diagnosis not present

## 2017-11-25 MED ORDER — HYDROCODONE-HOMATROPINE 5-1.5 MG/5ML PO SYRP: 5.0000 mL | ORAL_SOLUTION | ORAL | 0 refills | Status: DC | PRN

## 2017-11-25 MED ORDER — AZITHROMYCIN 250 MG PO TABS: ORAL_TABLET | ORAL | 0 refills | Status: DC

## 2017-11-25 MED ORDER — TESTOSTERONE 10 MG/ACT (2%) TD GEL: 4.0000 | Freq: Every day | TRANSDERMAL | 5 refills | Status: DC

## 2017-11-25 NOTE — Progress Notes (Signed)
   Subjective:    Patient ID: Edward Curtis, male    DOB: 1956/07/15, 62 y.o.   MRN: [id number]  HPI Here for 5 days of ST, chest tightness and coughing up green sputum. No fever. Also he wishes to start on a testosterone replacement medication and his insurance company will cover generic Fortesta.  His level was low at 206 recently.    Review of Systems  Constitutional: Negative.   HENT: Positive for congestion, postnasal drip and sore throat. Negative for sinus pressure and sinus pain.   Eyes: Negative.   Respiratory: Positive for cough and chest tightness.        Objective:   Physical Exam  Constitutional: He appears well-developed and well-nourished.  HENT:  Right Ear: External ear normal.  Left Ear: External ear normal.  Nose: Nose normal.  Mouth/Throat: Oropharynx is clear and moist.  Eyes: Conjunctivae are normal.  Neck: No thyromegaly present.  Pulmonary/Chest: No respiratory distress. He has no wheezes. He has no rales.  Scattered rhonchi   Lymphadenopathy:    He has no cervical adenopathy.          Assessment & Plan:  Bronchitis, treat with a Zpack and Mucinex.  Start on [place]esta 4 pumps daily. Check a testosterone level in 6 months. Gershon CraneStephen Zauria Dombek, MD

## 2017-12-02 NOTE — Telephone Encounter (Signed)
Prior auth sent to [email or web address].

## 2017-12-02 NOTE — Telephone Encounter (Signed)
Sent to PCP ?

## 2017-12-02 NOTE — Telephone Encounter (Signed)
See prior note

## 2017-12-02 NOTE — Telephone Encounter (Signed)
Fax received stating the medication was not approved and given to Dr Claris CheFry's asst.

## 2017-12-05 ENCOUNTER — Other Ambulatory Visit: Payer: Self-pay

## 2017-12-05 DIAGNOSIS — E291 Testicular hypofunction: Secondary | ICD-10-CM

## 2017-12-05 NOTE — Telephone Encounter (Signed)
Called and spoke with pt. Pt advised and schedule for lab appt as needed.

## 2017-12-05 NOTE — Telephone Encounter (Signed)
For his insurance to cover this he will need to have 2 testosterone levels drawn in the morning. The one he did the other day was in the afternoon and will not count

## 2017-12-05 NOTE — Telephone Encounter (Signed)
Orders are in for 2/15 will add 2nd order once 1 st orders have been done keep message as a reminder

## 2017-12-06 DIAGNOSIS — L814 Other melanin hyperpigmentation: Secondary | ICD-10-CM | POA: Diagnosis not present

## 2017-12-06 DIAGNOSIS — D2262 Melanocytic nevi of left upper limb, including shoulder: Secondary | ICD-10-CM | POA: Diagnosis not present

## 2017-12-06 DIAGNOSIS — L570 Actinic keratosis: Secondary | ICD-10-CM | POA: Diagnosis not present

## 2017-12-06 DIAGNOSIS — L821 Other seborrheic keratosis: Secondary | ICD-10-CM | POA: Diagnosis not present

## 2017-12-06 DIAGNOSIS — D1801 Hemangioma of skin and subcutaneous tissue: Secondary | ICD-10-CM | POA: Diagnosis not present

## 2017-12-09 ENCOUNTER — Other Ambulatory Visit (INDEPENDENT_AMBULATORY_CARE_PROVIDER_SITE_OTHER): Payer: Federal, State, Local not specified - PPO

## 2017-12-09 DIAGNOSIS — E291 Testicular hypofunction: Secondary | ICD-10-CM | POA: Diagnosis not present

## 2017-12-09 LAB — TESTOSTERONE: Testosterone: 180.36 ng/dL — ABNORMAL LOW (ref 300.00–890.00)

## 2017-12-12 NOTE — Telephone Encounter (Signed)
Both labs results are in; pt came by office. Hard copy of results were given to pt this morning.

## 2017-12-13 DIAGNOSIS — I422 Other hypertrophic cardiomyopathy: Secondary | ICD-10-CM | POA: Diagnosis not present

## 2017-12-13 DIAGNOSIS — I471 Supraventricular tachycardia: Secondary | ICD-10-CM | POA: Diagnosis not present

## 2017-12-13 DIAGNOSIS — E668 Other obesity: Secondary | ICD-10-CM | POA: Diagnosis not present

## 2017-12-16 ENCOUNTER — Other Ambulatory Visit (INDEPENDENT_AMBULATORY_CARE_PROVIDER_SITE_OTHER): Payer: Federal, State, Local not specified - PPO

## 2017-12-16 ENCOUNTER — Other Ambulatory Visit: Payer: Self-pay

## 2017-12-16 DIAGNOSIS — E291 Testicular hypofunction: Secondary | ICD-10-CM | POA: Diagnosis not present

## 2017-12-16 LAB — TESTOSTERONE: Testosterone: 287.08 ng/dL — ABNORMAL LOW (ref 300.00–890.00)

## 2017-12-26 ENCOUNTER — Telehealth: Payer: Self-pay | Admitting: *Deleted

## 2017-12-26 ENCOUNTER — Other Ambulatory Visit: Payer: Self-pay

## 2017-12-26 MED ORDER — TESTOSTERONE 10 MG/ACT (2%) TD GEL: 4.0000 | Freq: Every day | TRANSDERMAL | 5 refills | Status: DC

## 2017-12-26 NOTE — Telephone Encounter (Signed)
Prior auth for Testosterone gel 10mg  topical gel sent to [email or web address].

## 2017-12-30 NOTE — Telephone Encounter (Signed)
Note in [email or web address] state the request was approved.  I called [name] and informed Onalee HuaDavid of this.

## 2018-01-02 ENCOUNTER — Telehealth: Payer: Self-pay | Admitting: Family Medicine

## 2018-01-02 NOTE — Telephone Encounter (Signed)
[facility]  She stated the pt showed her a letter stating the Rx was approved but it is being rejected when they try to submit this through his insurance.  I advised Maryann I do not enter dates on the request other than the date the request is sent and this cannot be changed, so I am not sure how to help in this case and advised to have the pt contact his insurance and she agreed.

## 2018-01-02 NOTE — Telephone Encounter (Signed)
Copied from CRM [phone]. Topic: Quick Communication - See Telephone Encounter >> Jan 02, 2018  4:48 PM Arlyss Gandyichardson, Vadis Slabach N, NT wrote: CRM for notification. See Telephone encounter for: [name] calling and states that the PA needs to be redone for the Testosterone 10 MG/ACT (2%) GEL. The insurance company stated to them that the wrong dates were in on the PA for this rx. CB#: [phone]  01/02/18.

## 2018-02-21 DIAGNOSIS — M5126 Other intervertebral disc displacement, lumbar region: Secondary | ICD-10-CM | POA: Diagnosis not present

## 2018-02-21 DIAGNOSIS — M5417 Radiculopathy, lumbosacral region: Secondary | ICD-10-CM | POA: Diagnosis not present

## 2018-03-14 DIAGNOSIS — M5417 Radiculopathy, lumbosacral region: Secondary | ICD-10-CM | POA: Diagnosis not present

## 2018-03-14 DIAGNOSIS — Z79899 Other long term (current) drug therapy: Secondary | ICD-10-CM | POA: Diagnosis not present

## 2018-03-14 DIAGNOSIS — M5020 Other cervical disc displacement, unspecified cervical region: Secondary | ICD-10-CM | POA: Diagnosis not present

## 2018-03-14 DIAGNOSIS — M5126 Other intervertebral disc displacement, lumbar region: Secondary | ICD-10-CM | POA: Diagnosis not present

## 2018-03-17 DIAGNOSIS — L309 Dermatitis, unspecified: Secondary | ICD-10-CM | POA: Diagnosis not present

## 2018-03-22 DIAGNOSIS — Z79899 Other long term (current) drug therapy: Secondary | ICD-10-CM | POA: Diagnosis not present

## 2018-03-22 DIAGNOSIS — M5417 Radiculopathy, lumbosacral region: Secondary | ICD-10-CM | POA: Diagnosis not present

## 2018-04-06 DIAGNOSIS — D485 Neoplasm of uncertain behavior of skin: Secondary | ICD-10-CM | POA: Diagnosis not present

## 2018-04-06 DIAGNOSIS — L309 Dermatitis, unspecified: Secondary | ICD-10-CM | POA: Diagnosis not present

## 2018-08-08 ENCOUNTER — Encounter: Payer: Self-pay | Admitting: Family Medicine

## 2018-08-08 ENCOUNTER — Ambulatory Visit: Payer: Federal, State, Local not specified - PPO | Admitting: Family Medicine

## 2018-08-08 VITALS — BP 120/82 | HR 93 | Temp 98.5°F | Wt 213.3 lb

## 2018-08-08 DIAGNOSIS — N410 Acute prostatitis: Secondary | ICD-10-CM

## 2018-08-08 DIAGNOSIS — R300 Dysuria: Secondary | ICD-10-CM | POA: Diagnosis not present

## 2018-08-08 LAB — POCT URINALYSIS DIPSTICK (MANUAL)
Leukocytes, UA: NEGATIVE
Nitrite, UA: NEGATIVE
PH UA: 6 (ref 5.0–8.0)
POCT BILIRUBIN: NEGATIVE
POCT GLUCOSE: NORMAL mg/dL
POCT PROTEIN: NEGATIVE mg/dL
Poct Ketones: NEGATIVE
Spec Grav, UA: 1.02 (ref 1.010–1.025)

## 2018-08-08 MED ORDER — CIPROFLOXACIN HCL 500 MG PO TABS: 500.0000 mg | ORAL_TABLET | Freq: Two times a day (BID) | ORAL | 0 refills | Status: DC

## 2018-08-08 NOTE — Progress Notes (Signed)
   Subjective:    Patient ID: Edward Curtis, male    DOB: 27-Jan-1956, 62 y.o.   MRN: [id number]  HPI Here for several issues. About 2 weeks ago he began to notice some increased frequency of urination and some burning at the tip of the penis. No DC. Then 2 days ago he had some pain around the rectum. He was a bit constipated last weekend and strained to pass a few hard stools but he is back to normal now. No blood in the stool. No fever.    Review of Systems  Constitutional: Negative.   Respiratory: Negative.   Cardiovascular: Negative.   Gastrointestinal: Positive for rectal pain. Negative for abdominal distention, abdominal pain, anal bleeding and blood in stool.  Genitourinary: Positive for dysuria and frequency. Negative for hematuria, scrotal swelling and testicular pain.       Objective:   Physical Exam  Constitutional: He appears well-developed and well-nourished.  Neck: No thyromegaly present.  Cardiovascular: Normal rate, regular rhythm, normal heart sounds and intact distal pulses.  Pulmonary/Chest: Effort normal and breath sounds normal.  Genitourinary: Rectum normal and penis normal.  Genitourinary Comments: No testicular tenderness. The prostate is mildly swollen and tender   Lymphadenopathy:    He has no cervical adenopathy.          Assessment & Plan:  Prostatitis , treat with Cipro.  Gershon Crane, MD

## 2018-08-09 LAB — URINE CULTURE
MICRO NUMBER:: 91238500
Result:: NO GROWTH
SPECIMEN QUALITY:: ADEQUATE

## 2018-08-15 ENCOUNTER — Encounter: Payer: Self-pay | Admitting: *Deleted

## 2018-08-17 ENCOUNTER — Ambulatory Visit: Payer: Self-pay | Admitting: *Deleted

## 2018-08-17 NOTE — Telephone Encounter (Signed)
Patient began experiencing right lower leg pain (the inner aspect)today after several hours of up and down walking at work. The cramp-like pain began as he was walking and seems to ease off with sitting. Denies warmth/redness/swelling/rash/CP/SOB. It does not hurt to touch. Reports there is a pinpoint sized red bump in the area. He took potassium approximately 1:15p and one ASA. Encouraged increased water intake, banana or additional potassium supplement. If no further pain today he can call in the morning to cancel tomorrow's 3:15p appointment with Dr. [place]. Reviewed s/sx that would warrant immediate evaluation. Reason for Disposition . [1] Leg pain which occurs after walking a certain distance AND [2] disappears with rest AND [3] age > 50 . [1] Pain in front of the lower leg(s) (shins)     AND [2] occurs with running or jumping exercise  (e.g., jogging,  basketball)  Answer Assessment - Initial Assessment Questions 1. ONSET: "When did the pain start?"      Began 12:45 today 2. LOCATION: "Where is the pain located?"      Right lower leg inner aspect of calf 3. PAIN: "How bad is the pain?"    (Scale 1-10; or mild, moderate, severe)   -  MILD (1-3): doesn't interfere with normal activities    -  MODERATE (4-7): interferes with normal activities (e.g., work or school) or awakens from sleep, limping    -  SEVERE (8-10): excruciating pain, unable to do any normal activities, unable to walk     moderate 4. WORK OR EXERCISE: "Has there been any recent work or exercise that involved this part of the body?"      Up and down at work today for 3 hours prior to the cramp starting. 5. CAUSE: "What do you think is causing the leg pain?"     unknown 6. OTHER SYMPTOMS: "Do you have any other symptoms?" (e.g., chest pain, back pain, breathing difficulty, swelling, rash, fever, numbness, weakness)     no 7. PREGNANCY: "Is there any chance you are pregnant?" "When was your last menstrual period?"      na  Protocols used: LEG PAIN-A-AH

## 2018-08-18 ENCOUNTER — Ambulatory Visit: Payer: Federal, State, Local not specified - PPO | Admitting: Family Medicine

## 2018-08-18 ENCOUNTER — Encounter: Payer: Self-pay | Admitting: Family Medicine

## 2018-08-18 VITALS — BP 124/70 | HR 92 | Temp 98.4°F | Resp 12 | Ht 68.0 in | Wt 213.4 lb

## 2018-08-18 DIAGNOSIS — G629 Polyneuropathy, unspecified: Secondary | ICD-10-CM

## 2018-08-18 DIAGNOSIS — R252 Cramp and spasm: Secondary | ICD-10-CM

## 2018-08-18 DIAGNOSIS — M79604 Pain in right leg: Secondary | ICD-10-CM

## 2018-08-18 DIAGNOSIS — N419 Inflammatory disease of prostate, unspecified: Secondary | ICD-10-CM | POA: Diagnosis not present

## 2018-08-18 MED ORDER — DOXYCYCLINE HYCLATE 100 MG PO TABS: 100.0000 mg | ORAL_TABLET | Freq: Two times a day (BID) | ORAL | 0 refills | Status: AC

## 2018-08-18 NOTE — Patient Instructions (Addendum)
A few things to remember from today's visit:   Muscle cramp  Neuropathy  Prostatitis, unspecified prostatitis type - Plan: doxycycline (VIBRA-TABS) 100 MG tablet  Monitor for swelling, redness, or worsening pain.   Please be sure medication list is accurate. If a new problem present, please set up appointment sooner than planned today.

## 2018-08-18 NOTE — Progress Notes (Signed)
ACUTE VISIT   HPI:  Chief Complaint  Patient presents with  . Possible reaction to medication    right leg muscle cramp and pain that started after taking Cipro for a few days    Edward Curtis is a 62 y.o. male, who is here today complaining of right lower extremity cramp yesterday afternoon, "extraordinary painful." Today pain is better, has some soreness on calf. He has had LE joint pain,Hx of OA.   Pain exacerbated by walking and alleviated by rest. He has no noted edema or erythema.  Bilateral foot needle and pin sensation, intermittently, mild. No Hx of neuropathy. He has not identified exacerbating or alleviating factors.   He has history of lower back pain with dull numbness, he has received epidural injections.   He has been on ciprofloxacin to treat prostatitis, 9th day yesterday. Urinary symptoms have improved. No fever,chills,abdominal pain,or rectal pain.   No history of trauma, travel, surgery.  He denies palpitations, chest pain, dyspnea.   Review of Systems  Constitutional: Negative for appetite change, fatigue and fever.  HENT: Negative for mouth sores and sore throat.   Respiratory: Negative for shortness of breath and wheezing.   Cardiovascular: Negative for chest pain, palpitations and leg swelling.  Gastrointestinal: Negative for abdominal pain, nausea and vomiting.  Genitourinary: Negative for decreased urine volume, dysuria and hematuria.  Musculoskeletal: Positive for arthralgias, back pain and myalgias.  Skin: Negative for rash and wound.  Hematological: Negative for adenopathy. Does not bruise/bleed easily.      Current Outpatient Medications on File Prior to Visit  Medication Sig Dispense Refill  . acetaminophen (TYLENOL) 500 MG tablet Take 500 mg by mouth every 6 (six) hours as needed.    [name] albuterol (PROVENTIL HFA;VENTOLIN HFA) 108 (90 BASE) MCG/ACT inhaler Inhale 2 puffs into the lungs every 6 (six) hours as needed for  wheezing or shortness of breath. Reported on 10/17/2015    . diphenhydrAMINE (BENADRYL) 25 mg capsule Take 50 mg by mouth every 6 (six) hours as needed.     . diphenhydramine-acetaminophen (TYLENOL PM) 25-500 MG TABS Take 1 tablet by mouth at bedtime as needed.    [name] EPINEPHrine (EPIPEN 2-PAK) 0.3 mg/0.3 mL SOAJ injection Inject 0.3 mLs (0.3 mg total) into the muscle once as needed (for severe allergic reaction). CAll 911 immediately if you have to use this medicine 1 Device 1  . HYDROcodone-homatropine (HYDROMET) 5-1.5 MG/5ML syrup Take 5 mLs by mouth every 4 (four) hours as needed. 240 mL 0  . ibuprofen (ADVIL,MOTRIN) 200 MG tablet Take 200 mg by mouth every 6 (six) hours as needed (as needed).    [name] oxycodone-acetaminophen (LYNOX) 10-300 MG per tablet Take 1 tablet by mouth every 4 (four) hours as needed.      . tadalafil (CIALIS) 5 MG tablet Take 1 tablet (5 mg total) by mouth daily as needed for erectile dysfunction. 10 tablet 0  . Testosterone 10 MG/ACT (2%) GEL Place 4 Act onto the skin daily. 60 g 5  . valACYclovir (VALTREX) 500 MG tablet Take 500 mg by mouth as needed.      . verapamil (COVERA HS) 180 MG (CO) 24 hr tablet Take 180 mg by mouth at bedtime.    . ciprofloxacin (CIPRO) 500 MG tablet Take 1 tablet (500 mg total) by mouth 2 (two) times daily. (Patient not taking: Reported on 08/18/2018) 28 tablet 0   No current facility-administered medications on file prior to visit.  Past Medical History:  Diagnosis Date  . Allergy    seasonal allergies   . Arthritis   . Asthma   . Blood transfusion without reported diagnosis 1985  . Calcium oxalate renal stones    early 1990's  . Chicken pox   . Chronic neck pain   . Depression   . GERD (gastroesophageal reflux disease)   . Hyperlipidemia 10/10/2013  . Hypertrophic obstructive cardiomyopathy(425.11) 10/10/2013  . IBS (irritable bowel syndrome) 12/07/2011  . Migraines   . Neuromuscular disorder (HCC)    radiculopathy left side    . Obesity (BMI 30-39.9) 10/10/2013  . Seasonal allergies    Allergies  Allergen Reactions  . Iodinated Diagnostic Agents Hives and Itching    Patient must be premedicated with 13 hour protocol prior to IV contrast administration  . Erythromycin     emycin and clindamycin with nausea  . Ibuprofen Other (See Comments)    Causes tachycardia  . Other Other (See Comments)     Walnut trees and walnuts   . Penicillins Hives  . Sulfa Antibiotics Hives    Social History   Socioeconomic History  . Marital status: Single    Spouse name: Not on file  . Number of children: Not on file  . Years of education: Not on file  . Highest education level: Not on file  Occupational History  . Not on file  Social Needs  . Financial resource strain: Not on file  . Food insecurity:    Worry: Not on file    Inability: Not on file  . Transportation needs:    Medical: Not on file    Non-medical: Not on file  Tobacco Use  . Smoking status: Never Smoker  . Smokeless tobacco: Never Used  Substance and Sexual Activity  . Alcohol use: Yes    Alcohol/week: 1.0 standard drinks    Types: 1 Glasses of wine per week    Comment: once a month  . Drug use: No  . Sexual activity: Not Currently  Lifestyle  . Physical activity:    Days per week: Not on file    Minutes per session: Not on file  . Stress: Not on file  Relationships  . Social connections:    Talks on phone: Not on file    Gets together: Not on file    Attends religious service: Not on file    Active member of club or organization: Not on file    Attends meetings of clubs or organizations: Not on file    Relationship status: Not on file  Other Topics Concern  . Not on file  Social History Narrative  . Not on file    Vitals:   08/18/18 1517  BP: 124/70  Pulse: 92  Resp: 12  Temp: 98.4 F (36.9 C)  SpO2: 96%   Body mass index is 32.44 kg/m.   Physical Exam  Nursing note and vitals reviewed. Constitutional: He is  oriented to person, place, and time. He appears well-developed. No distress.  HENT:  Head: Normocephalic and atraumatic.  Mouth/Throat: Oropharynx is clear and moist and mucous membranes are normal.  Eyes: Conjunctivae are normal.  Cardiovascular: Normal rate and regular rhythm.  No murmur heard. Pulses:      Dorsalis pedis pulses are 2+ on the right side, and 2+ on the left side.  Holman's sign negative.   Respiratory: Effort normal and breath sounds normal. No respiratory distress.  GI: Soft. He exhibits no mass. There is no  hepatomegaly. There is no tenderness.  Musculoskeletal: He exhibits no edema.       Right lower leg: He exhibits tenderness. He exhibits no edema and no deformity.  Right mildly bigger diameter than left and tenderness with pressure mid calf.   Lymphadenopathy:    He has no cervical adenopathy.  Neurological: He is alert and oriented to person, place, and time. He has normal strength. No cranial nerve deficit. Gait normal.  Skin: Skin is warm. No rash noted. No erythema.  Psychiatric: He has a normal mood and affect.  Well groomed, good eye contact.    ASSESSMENT AND PLAN:   Edward Curtis was seen today for possible reaction to medication.  Diagnoses and all orders for this visit:  Pain of right lower extremity We discussed possible etiologies. Pain can be related to muscle cramp he had yesterday., He is not sure if RLE is usually bigger than left,no edema,erythema.So I do not think he needs to have stat work-up today.  Clearly instructed about warning signs,he voices understanding.  Muscle cramp  Could be related to Cipro. Achilles tendon intact.  Neuropathy  Reporting no prior Hx ,so assumed to be related to Cipro. Other possible causes discussed. Monitor for changes,follow with PCP if not resolved in a couple weeks,before if it gets worse.   Prostatitis, unspecified prostatitis type  Symptoms improved. Allergic to sulfas. Doxycycline x 7  days. F/U with PCP as needed.  -     doxycycline (VIBRA-TABS) 100 MG tablet; Take 1 tablet (100 mg total) by mouth 2 (two) times daily for 7 days.     Return if symptoms worsen or fail to improve.        Loveda Colaizzi G. [place], MD  [facility]. [place] office.

## 2018-08-20 ENCOUNTER — Encounter: Payer: Self-pay | Admitting: Family Medicine

## 2018-08-28 ENCOUNTER — Emergency Department (HOSPITAL_BASED_OUTPATIENT_CLINIC_OR_DEPARTMENT_OTHER): Payer: Federal, State, Local not specified - PPO

## 2018-08-28 ENCOUNTER — Emergency Department (HOSPITAL_COMMUNITY)
Admission: EM | Admit: 2018-08-28 | Discharge: 2018-08-28 | Disposition: A | Discharge status: Home / Self Care | Payer: Federal, State, Local not specified - PPO | Attending: Emergency Medicine | Admitting: Emergency Medicine

## 2018-08-28 ENCOUNTER — Encounter (HOSPITAL_COMMUNITY): Payer: Self-pay | Admitting: Emergency Medicine

## 2018-08-28 ENCOUNTER — Ambulatory Visit: Payer: Self-pay

## 2018-08-28 DIAGNOSIS — S86911A Strain of unspecified muscle(s) and tendon(s) at lower leg level, right leg, initial encounter: Secondary | ICD-10-CM | POA: Diagnosis not present

## 2018-08-28 DIAGNOSIS — Z79899 Other long term (current) drug therapy: Secondary | ICD-10-CM | POA: Diagnosis not present

## 2018-08-28 DIAGNOSIS — Y999 Unspecified external cause status: Secondary | ICD-10-CM | POA: Insufficient documentation

## 2018-08-28 DIAGNOSIS — R2241 Localized swelling, mass and lump, right lower limb: Secondary | ICD-10-CM | POA: Diagnosis not present

## 2018-08-28 DIAGNOSIS — Z7982 Long term (current) use of aspirin: Secondary | ICD-10-CM | POA: Insufficient documentation

## 2018-08-28 DIAGNOSIS — Y939 Activity, unspecified: Secondary | ICD-10-CM | POA: Diagnosis not present

## 2018-08-28 DIAGNOSIS — M7989 Other specified soft tissue disorders: Secondary | ICD-10-CM | POA: Diagnosis not present

## 2018-08-28 DIAGNOSIS — J45909 Unspecified asthma, uncomplicated: Secondary | ICD-10-CM | POA: Diagnosis not present

## 2018-08-28 DIAGNOSIS — R52 Pain, unspecified: Secondary | ICD-10-CM

## 2018-08-28 DIAGNOSIS — T148XXA Other injury of unspecified body region, initial encounter: Secondary | ICD-10-CM

## 2018-08-28 DIAGNOSIS — Y929 Unspecified place or not applicable: Secondary | ICD-10-CM | POA: Diagnosis not present

## 2018-08-28 DIAGNOSIS — X58XXXA Exposure to other specified factors, initial encounter: Secondary | ICD-10-CM | POA: Diagnosis not present

## 2018-08-28 LAB — URINALYSIS, ROUTINE W REFLEX MICROSCOPIC
BILIRUBIN URINE: NEGATIVE
Bacteria, UA: NONE SEEN
GLUCOSE, UA: NEGATIVE mg/dL
KETONES UR: NEGATIVE mg/dL
LEUKOCYTES UA: NEGATIVE
Nitrite: NEGATIVE
PH: 7 (ref 5.0–8.0)
Protein, ur: NEGATIVE mg/dL
Specific Gravity, Urine: 1.01 (ref 1.005–1.030)

## 2018-08-28 NOTE — Telephone Encounter (Signed)
Edward Curtis was sent to the ER to be evaluated for DVT. Thanks, BJ

## 2018-08-28 NOTE — ED Notes (Signed)
Patient given discharge teaching and verbalized understanding. Patient ambulated out of ED with a steady gait. 

## 2018-08-28 NOTE — ED Provider Notes (Signed)
[facility]-EMERGENCY DEPT Provider Note   CSN: [id number] Arrival date & time: 08/28/18  1103     History   Chief Complaint Chief Complaint  Patient presents with  . Leg Swelling  . Leg Pain    HPI Edward Curtis is a 62 y.o. male.  HPI  Right leg pain 2.5 weeks ago Had taken cipro for prostatitis for 9 days, taken off of it and put on doxy This weekend began to have swelling, Sunday was more severe, ankle, lower leg, kept getting more swollen Told to go to ED for DVT Right calf pain, had just gotten up from desk and was walking down hall, has been walking on it since then, initially was using cane to help walk, pain improved then swelling began Had been sitting a lot at computer over the weekend    Past Medical History:  Diagnosis Date  . Allergy    seasonal allergies   . Arthritis   . Asthma   . Blood transfusion without reported diagnosis 1985  . Calcium oxalate renal stones    early 1990's  . Chicken pox   . Chronic neck pain   . Depression   . GERD (gastroesophageal reflux disease)   . Hyperlipidemia 10/10/2013  . Hypertrophic obstructive cardiomyopathy(425.11) 10/10/2013  . IBS (irritable bowel syndrome) 12/07/2011  . Migraines   . Neuromuscular disorder (HCC)    radiculopathy left side   . Obesity (BMI 30-39.9) 10/10/2013  . Seasonal allergies     Patient Active Problem List   Diagnosis Date Noted  . Hypogonadism in male 11/25/2017  . Scabies 08/20/2017  . Nasal abrasion 08/20/2017  . Anxiety and depression 11/12/2013  . High risk sexual behavior 11/12/2013  . Prediabetes 11/12/2013  . Obesity (BMI 30-39.9) 10/10/2013  . Hyperlipidemia 10/10/2013  . Hypertrophic cardiomyopathy (HCC)   . IBS (irritable bowel syndrome) 12/07/2011  . HIstory of SVT   . Chronic back pain     Past Surgical History:  Procedure Laterality Date  . left knee surgery  1990  . MOUTH SURGERY  1975/1993  . pre cancerous mole  2013  . prostate  procedure  1980  . umbilical cyst  1985        Home Medications    Prior to Admission medications   Medication Sig Start Date End Date Taking? Authorizing Provider  acetaminophen (TYLENOL) 500 MG tablet Take 1,000 mg by mouth daily as needed for moderate pain.    Yes [provider]  aspirin 325 MG tablet Take 325 mg by mouth daily.   Yes [provider]  aspirin EC 81 MG tablet Take 81 mg by mouth daily as needed for mild pain.   Yes [provider]  diphenhydrAMINE (BENADRYL) 25 mg capsule Take 50 mg by mouth every 6 (six) hours as needed.    Yes [provider]  Probiotic Product (PROBIOTIC PO) Take 1 tablet by mouth daily.   Yes [provider]  verapamil (COVERA HS) 180 MG (CO) 24 hr tablet Take 180 mg by mouth daily.    Yes [provider]  ciprofloxacin (CIPRO) 500 MG tablet Take 1 tablet (500 mg total) by mouth 2 (two) times daily. Patient not taking: Reported on 08/18/2018 08/08/18   [name], MD  EPINEPHrine (EPIPEN 2-PAK) 0.3 mg/0.3 mL SOAJ injection Inject 0.3 mLs (0.3 mg total) into the muscle once as needed (for severe allergic reaction). CAll 911 immediately if you have to use this medicine 10/12/13  Eber Hong, MD  HYDROcodone-homatropine (HYDROMET) 5-1.5 MG/5ML syrup Take 5 mLs by mouth every 4 (four) hours as needed. Patient not taking: Reported on 08/28/2018 11/25/17   [name], MD  tadalafil (CIALIS) 5 MG tablet Take 1 tablet (5 mg total) by mouth daily as needed for erectile dysfunction. Patient not taking: Reported on 08/28/2018 11/03/16   Shirline Frees, NP  Testosterone 10 MG/ACT (2%) GEL Place 4 Act onto the skin daily. Patient not taking: Reported on 08/28/2018 12/26/17   [name], MD    Family History Family History  Problem Relation Age of Onset  . Arthritis Mother   . Hyperlipidemia Mother   . Heart disease Mother   . Hypertension Mother   . Heart disease Father   . Hypertension Father    . Diabetes Father   . Arthritis Paternal Grandmother   . Diabetes Paternal Grandmother   . Colon cancer Paternal Grandfather   . Heart disease Paternal Grandfather   . Diabetes Paternal Grandfather   . Hyperlipidemia Maternal Grandmother   . Hypertension Unknown        both sets of parents  . Esophageal cancer Neg Hx   . Rectal cancer Neg Hx   . Stomach cancer Neg Hx     Social History Social History   Tobacco Use  . Smoking status: Never Smoker  . Smokeless tobacco: Never Used  Substance Use Topics  . Alcohol use: Yes    Alcohol/week: 1.0 standard drinks    Types: 1 Glasses of wine per week    Comment: once a month  . Drug use: No     Allergies   Ciprofloxacin; Penicillins; Iodinated diagnostic agents; Erythromycin; Ibuprofen; Other; and Sulfa antibiotics   Review of Systems Review of Systems  Constitutional: Negative for fever.  HENT: Negative for sore throat.   Eyes: Negative for visual disturbance.  Respiratory: Negative for shortness of breath.   Cardiovascular: Positive for leg swelling. Negative for chest pain.  Gastrointestinal: Negative for abdominal pain.  Genitourinary: Negative for difficulty urinating.  Musculoskeletal: Positive for arthralgias and myalgias. Negative for back pain and neck stiffness.  Skin: Negative for rash.  Neurological: Negative for syncope and headaches.     Physical Exam Updated Vital Signs BP (!) 149/84   Pulse 89   Temp 98.9 F (37.2 C) (Oral)   Resp 15   SpO2 97%   Physical Exam  Constitutional: He is oriented to person, place, and time. He appears well-developed and well-nourished. No distress.  HENT:  Head: Normocephalic and atraumatic.  Eyes: Conjunctivae and EOM are normal.  Neck: Normal range of motion.  Cardiovascular: Normal rate, regular rhythm, normal heart sounds and intact distal pulses. Exam reveals no gallop and no friction rub.  No murmur heard. Pulmonary/Chest: Effort normal and breath sounds  normal. No respiratory distress. He has no wheezes. He has no rales.  Abdominal: Soft. He exhibits no distension. There is no tenderness. There is no guarding.  Musculoskeletal: He exhibits no edema.  Very slight swelling right lower extremity more notab;e around medial malleolus, no erythema, sock lines present  Neurological: He is alert and oriented to person, place, and time.  Skin: Skin is warm and dry. He is not diaphoretic.  Nursing note and vitals reviewed.    ED Treatments / Results  Labs (all labs ordered are listed, but only abnormal results are displayed) Labs Reviewed  URINALYSIS, ROUTINE W REFLEX MICROSCOPIC - Abnormal; Notable for the following components:  Result Value   Color, Urine STRAW (*)    Hgb urine dipstick SMALL (*)    All other components within normal limits    EKG None  Radiology Vas [place] Lower Extremity Venous (dvt) (only [facility])  Result Date: 08/28/2018  Lower Venous Study Indications: Pain, and Swelling.  Performing Technologist: Gertie Fey MHA, RDMS, RVT, RDCS  Examination Guidelines: A complete evaluation includes B-mode imaging, spectral Doppler, color Doppler, and power Doppler as needed of all accessible portions of each vessel. Bilateral testing is considered an integral part of a complete examination. Limited examinations for reoccurring indications may be performed as noted.  Right Venous Findings: +---------+---------------+---------+-----------+----------+-------+          CompressibilityPhasicitySpontaneityPropertiesSummary +---------+---------------+---------+-----------+----------+-------+ CFV      Full           Yes      Yes                          +---------+---------------+---------+-----------+----------+-------+ SFJ      Full                                                 +---------+---------------+---------+-----------+----------+-------+ FV Prox  Full                                                  +---------+---------------+---------+-----------+----------+-------+ FV Mid   Full                                                 +---------+---------------+---------+-----------+----------+-------+ FV DistalFull                                                 +---------+---------------+---------+-----------+----------+-------+ PFV      Full                                                 +---------+---------------+---------+-----------+----------+-------+ POP      Full           Yes      Yes                          +---------+---------------+---------+-----------+----------+-------+ PTV      Full                                                 +---------+---------------+---------+-----------+----------+-------+ PERO     Full                                                 +---------+---------------+---------+-----------+----------+-------+  Left Venous Findings: +---+---------------+---------+-----------+----------+-------+    CompressibilityPhasicitySpontaneityPropertiesSummary +---+---------------+---------+-----------+----------+-------+ CFVFull           Yes      Yes                          +---+---------------+---------+-----------+----------+-------+    Summary: Right: There is no evidence of deep vein thrombosis in the lower extremity. No cystic structure found in the popliteal fossa. Left: No evidence of common femoral vein obstruction.  *See table(s) above for measurements and observations. Electronically signed by Fabienne Bruns MD on 08/28/2018 at 6:52:04 PM.    Final     Procedures Procedures (including critical care time)  Medications Ordered in ED Medications - No data to display   Initial Impression / Assessment and Plan / ED Course  I have reviewed the triage vital signs and the nursing notes.  Pertinent labs & imaging results that were available during my care of the patient were reviewed by me and considered in my medical  decision making (see chart for details).     62 year old male presents with concern for right lower extremity swelling, reports symptoms initially began with pain 2 weeks ago.  He has good pulses bilaterally, no signs of arterial wellness.  No signs of erythema, cellulitis, or septic arthritis.  There is shortness of breath, no significant edema on exam, unilateral symptoms, I do not feel that emergent work-up for congestive heart failure is indicated by history.  DVT study is negative.  Suspect some swelling need be related to dependent edema from sitting, however I recommend close primary care physician follow-up for further outpatient evaluation.  Pt also reports urinary frequency, no signs of UTI or proteinuria. Not having pain, declines prostate exam at this time, will recommend close PCP follow up.    Final Clinical Impressions(s) / ED Diagnoses   Final diagnoses:  Right leg swelling  Muscle strain    ED Discharge Orders    None       Alvira Monday, MD 08/29/18 [phone]

## 2018-08-28 NOTE — ED Triage Notes (Signed)
2 weeks ago got up form desk at work and got right calf cramp. Would slightly get better every day.  Reports was told by doctor if have swelling and more tenderness to call them to be seen. Pt reports swelling in right lower leg since yesterday. Called PCP and Dr [place] advised pt to go to Ed for rule out DVT.  Reports has thickening of heart. Increase in urination over past 3 days.

## 2018-08-28 NOTE — ED Notes (Signed)
Took ASA 325mg  tablet this morning.

## 2018-08-28 NOTE — Progress Notes (Signed)
*  Preliminary Results* Right lower extremity venous duplex completed. Right lower extremity is negative for deep vein thrombosis. There is no evidence of right Baker's cyst.  08/28/2018 12:24 PM  Gertie Fey, MHA, RVT, RDCS, RDMS

## 2018-08-28 NOTE — Telephone Encounter (Signed)
Spoke with patient around 10 am and told him to go to [facility] ER for evaluation per Dr. Swaziland. Patient verbalized understanding.

## 2018-08-28 NOTE — Telephone Encounter (Signed)
Pt. Reports he saw  Dr. Swaziland 08/18/18 for right lower leg pain. States his leg was feeling better, but yesterday he started having swelling. Swelling is from his calf down. It feels tight and heavy. Some redness noted. "I'm concerned because maybe it is a blood clot." Denies chest pain or shortness of breath. No fever. Fleet Contras will speak with Dr. Sw[place]and and call pt. Back. Contact number [phone].  Reason for Disposition . [1] Thigh, calf, or ankle swelling AND [2] bilateral AND [3] 1 side is more swollen  Answer Assessment - Initial Assessment Questions 1. ONSET: "When did the swelling start?" (e.g., minutes, hours, days)     Started Sunday 2. LOCATION: "What part of the leg is swollen?"  "Are both legs swollen or just one leg?"     Right leg - Calf down involving ankle and foot  3. SEVERITY: "How bad is the swelling?" (e.g., localized; mild, moderate, severe)  - Localized - small area of swelling localized to one leg  - MILD pedal edema - swelling limited to foot and ankle, pitting edema < 1/4 inch (6 mm) deep, rest and elevation eliminate most or all swelling  - MODERATE edema - swelling of lower leg to knee, pitting edema > 1/4 inch (6 mm) deep, rest and elevation only partially reduce swelling  - SEVERE edema - swelling extends above knee, facial or hand swelling present      Moderate 4. REDNESS: "Does the swelling look red or infected?"     Some redness 5. PAIN: "Is the swelling painful to touch?" If so, ask: "How painful is it?"   (Scale 1-10; mild, moderate or severe)     2 6. FEVER: "Do you have a fever?" If so, ask: "What is it, how was it measured, and when did it start?"      No 7. CAUSE: "What do you think is causing the leg swelling?"     Unsure 8. MEDICAL HISTORY: "Do you have a history of heart failure, kidney disease, liver failure, or cancer?"      Heart issue - valve issue 9. RECURRENT SYMPTOM: "Have you had leg swelling before?" If so, ask: "When was the last time?"  "What happened that time?"     No 10. OTHER SYMPTOMS: "Do you have any other symptoms?" (e.g., chest pain, difficulty breathing)       No 11. PREGNANCY: "Is there any chance you are pregnant?" "When was your last menstrual period?"       N/A  Protocols used: LEG SWELLING AND EDEMA-A-AH

## 2018-08-31 ENCOUNTER — Encounter: Payer: Self-pay | Admitting: Cardiology

## 2018-08-31 ENCOUNTER — Ambulatory Visit: Payer: Federal, State, Local not specified - PPO | Admitting: Cardiology

## 2018-08-31 VITALS — BP 108/78 | HR 108 | Ht 68.0 in | Wt 211.4 lb

## 2018-08-31 DIAGNOSIS — I422 Other hypertrophic cardiomyopathy: Secondary | ICD-10-CM | POA: Diagnosis not present

## 2018-08-31 DIAGNOSIS — R609 Edema, unspecified: Secondary | ICD-10-CM | POA: Insufficient documentation

## 2018-08-31 NOTE — Patient Instructions (Addendum)
Medication Instructions:  Your physician recommends that you continue on your current medications as directed. Please refer to the Current Medication list given to you today.  If you need a refill on your cardiac medications before your next appointment, please call your pharmacy.   Lab work: Your physician recommends that you return for lab work today: CBC, ProBNP, CMP.   If you have labs (blood work) drawn today and your tests are completely normal, you will receive your results only by: [name] MyChart Message (if you have MyChart) OR . A paper copy in the mail If you have any lab test that is abnormal or we need to change your treatment, we will call you to review the results.  Testing/Procedures: You had an EKG today.   Your physician has requested that you have an echocardiogram. Echocardiography is a painless test that uses sound waves to create images of your heart. It provides your doctor with information about the size and shape of your heart and how well your heart's chambers and valves are working. This procedure takes approximately one hour. There are no restrictions for this procedure.  Follow-Up: At [facility], you and your health needs are our priority.  As part of our continuing mission to provide you with exceptional heart care, we have created designated Provider Care Teams.  These Care Teams include your primary Cardiologist (physician) and Advanced Practice Providers (APPs -  Physician Assistants and Nurse Practitioners) who all work together to provide you with the care you need, when you need it. . You will need a follow up appointment in 4 weeks.   Any Other Special Instructions Will Be Listed Below (If Applicable).  **Eliminate all dietary sodium/salt from your diet!**     Echocardiogram An echocardiogram, or echocardiography, uses sound waves (ultrasound) to produce an image of your heart. The echocardiogram is simple, painless, obtained within a short period of  time, and offers valuable information to your health care provider. The images from an echocardiogram can provide information such as:  Evidence of coronary artery disease (CAD).  Heart size.  Heart muscle function.  Heart valve function.  Aneurysm detection.  Evidence of a past heart attack.  Fluid buildup around the heart.  Heart muscle thickening.  Assess heart valve function.  Tell a health care provider about:  Any allergies you have.  All medicines you are taking, including vitamins, herbs, eye drops, creams, and over-the-counter medicines.  Any problems you or family members have had with anesthetic medicines.  Any blood disorders you have.  Any surgeries you have had.  Any medical conditions you have.  Whether you are pregnant or may be pregnant. What happens before the procedure? No special preparation is needed. Eat and drink normally. What happens during the procedure?  In order to produce an image of your heart, gel will be applied to your chest and a wand-like tool (transducer) will be moved over your chest. The gel will help transmit the sound waves from the transducer. The sound waves will harmlessly bounce off your heart to allow the heart images to be captured in real-time motion. These images will then be recorded.  You may need an IV to receive a medicine that improves the quality of the pictures. What happens after the procedure? You may return to your normal schedule including diet, activities, and medicines, unless your health care provider tells you otherwise. This information is not intended to replace advice given to you by your health care provider. Make sure  you discuss any questions you have with your health care provider. Document Released: 10/08/2000 Document Revised: 05/29/2016 Document Reviewed: 06/18/2013 Elsevier Interactive Patient Education  2017 Elsevier Inc.    DASH Eating Plan DASH stands for "Dietary Approaches to Stop  Hypertension." The DASH eating plan is a healthy eating plan that has been shown to reduce high blood pressure (hypertension). It may also reduce your risk for type 2 diabetes, heart disease, and stroke. The DASH eating plan may also help with weight loss. What are tips for following this plan? General guidelines Avoid eating more than 2,300 mg (milligrams) of salt (sodium) a day. If you have hypertension, you may need to reduce your sodium intake to 1,500 mg a day. Limit alcohol intake to no more than 1 drink a day for nonpregnant women and 2 drinks a day for men. One drink equals 12 oz of beer, 5 oz of wine, or 1 oz of hard liquor. Work with your health care provider to maintain a healthy body weight or to lose weight. Ask what an ideal weight is for you. Get at least 30 minutes of exercise that causes your heart to beat faster (aerobic exercise) most days of the week. Activities may include walking, swimming, or biking. Work with your health care provider or diet and nutrition specialist (dietitian) to adjust your eating plan to your individual calorie needs. Reading food labels Check food labels for the amount of sodium per serving. Choose foods with less than 5 percent of the Daily Value of sodium. Generally, foods with less than 300 mg of sodium per serving fit into this eating plan. To find whole grains, look for the word "whole" as the first word in the ingredient list. Shopping Buy products labeled as "low-sodium" or "no salt added." Buy fresh foods. Avoid canned foods and premade or frozen meals. Cooking Avoid adding salt when cooking. Use salt-free seasonings or herbs instead of table salt or sea salt. Check with your health care provider or pharmacist before using salt substitutes. Do not fry foods. Cook foods using healthy methods such as baking, boiling, grilling, and broiling instead. Cook with heart-healthy oils, such as olive, canola, soybean, or sunflower oil. Meal  planning  Eat a balanced diet that includes: 5 or more servings of fruits and vegetables each day. At each meal, try to fill half of your plate with fruits and vegetables. Up to 6-8 servings of whole grains each day. Less than 6 oz of lean meat, poultry, or fish each day. A 3-oz serving of meat is about the same size as a deck of cards. One egg equals 1 oz. 2 servings of low-fat dairy each day. A serving of nuts, seeds, or beans 5 times each week. Heart-healthy fats. Healthy fats called Omega-3 fatty acids are found in foods such as flaxseeds and coldwater fish, like sardines, salmon, and mackerel. Limit how much you eat of the following: Canned or prepackaged foods. Food that is high in trans fat, such as fried foods. Food that is high in saturated fat, such as fatty meat. Sweets, desserts, sugary drinks, and other foods with added sugar. Full-fat dairy products. Do not salt foods before eating. Try to eat at least 2 vegetarian meals each week. Eat more home-cooked food and less restaurant, buffet, and fast food. When eating at a restaurant, ask that your food be prepared with less salt or no salt, if possible. What foods are recommended? The items listed may not be a complete list. Talk with your  dietitian about what dietary choices are best for you. Grains Whole-grain or whole-wheat bread. Whole-grain or whole-wheat pasta. Brown rice. Orpah Cobb. Bulgur. Whole-grain and low-sodium cereals. Pita bread. Low-fat, low-sodium crackers. Whole-wheat flour tortillas. Vegetables Fresh or frozen vegetables (raw, steamed, roasted, or grilled). Low-sodium or reduced-sodium tomato and vegetable juice. Low-sodium or reduced-sodium tomato sauce and tomato paste. Low-sodium or reduced-sodium canned vegetables. Fruits All fresh, dried, or frozen fruit. Canned fruit in natural juice (without added sugar). Meat and other protein foods Skinless chicken or [place]. Ground chicken or [place]. Pork with  fat trimmed off. Fish and seafood. Egg whites. Dried beans, peas, or lentils. Unsalted nuts, nut butters, and seeds. Unsalted canned beans. Lean cuts of beef with fat trimmed off. Low-sodium, lean deli meat. Dairy Low-fat (1%) or fat-free (skim) milk. Fat-free, low-fat, or reduced-fat cheeses. Nonfat, low-sodium ricotta or cottage cheese. Low-fat or nonfat yogurt. Low-fat, low-sodium cheese. Fats and oils Soft margarine without trans fats. Vegetable oil. Low-fat, reduced-fat, or light mayonnaise and salad dressings (reduced-sodium). Canola, safflower, olive, soybean, and sunflower oils. Avocado. Seasoning and other foods Herbs. Spices. Seasoning mixes without salt. Unsalted popcorn and pretzels. Fat-free sweets. What foods are not recommended? The items listed may not be a complete list. Talk with your dietitian about what dietary choices are best for you. Grains Baked goods made with fat, such as croissants, muffins, or some breads. Dry pasta or rice meal packs. Vegetables Creamed or fried vegetables. Vegetables in a cheese sauce. Regular canned vegetables (not low-sodium or reduced-sodium). Regular canned tomato sauce and paste (not low-sodium or reduced-sodium). Regular tomato and vegetable juice (not low-sodium or reduced-sodium). Rosita Fire. Olives. Fruits Canned fruit in a light or heavy syrup. Fried fruit. Fruit in cream or butter sauce. Meat and other protein foods Fatty cuts of meat. Ribs. Fried meat. Tomasa Blase. Sausage. Bologna and other processed lunch meats. Salami. Fatback. Hotdogs. Bratwurst. Salted nuts and seeds. Canned beans with added salt. Canned or smoked fish. Whole eggs or egg yolks. Chicken or [place] with skin. Dairy Whole or 2% milk, cream, and half-and-half. Whole or full-fat cream cheese. Whole-fat or sweetened yogurt. Full-fat cheese. Nondairy creamers. Whipped toppings. Processed cheese and cheese spreads. Fats and oils Butter. Stick margarine. Lard. Shortening. Ghee. Bacon  fat. Tropical oils, such as coconut, palm kernel, or palm oil. Seasoning and other foods Salted popcorn and pretzels. Onion salt, garlic salt, seasoned salt, table salt, and sea salt. Worcestershire sauce. Tartar sauce. Barbecue sauce. Teriyaki sauce. Soy sauce, including reduced-sodium. Steak sauce. Canned and packaged gravies. Fish sauce. Oyster sauce. Cocktail sauce. Horseradish that you find on the shelf. Ketchup. Mustard. Meat flavorings and tenderizers. Bouillon cubes. Hot sauce and Tabasco sauce. Premade or packaged marinades. Premade or packaged taco seasonings. Relishes. Regular salad dressings. Where to find more information: National Heart, Lung, and Blood Institute: PopSteam.is American Heart Association: www.heart.org Summary The DASH eating plan is a healthy eating plan that has been shown to reduce high blood pressure (hypertension). It may also reduce your risk for type 2 diabetes, heart disease, and stroke. With the DASH eating plan, you should limit salt (sodium) intake to 2,300 mg a day. If you have hypertension, you may need to reduce your sodium intake to 1,500 mg a day. When on the DASH eating plan, aim to eat more fresh fruits and vegetables, whole grains, lean proteins, low-fat dairy, and heart-healthy fats. Work with your health care provider or diet and nutrition specialist (dietitian) to adjust your eating plan to your individual calorie needs. This  information is not intended to replace advice given to you by your health care provider. Make sure you discuss any questions you have with your health care provider. Document Released: 09/30/2011 Document Revised: 10/04/2016 Document Reviewed: 10/04/2016 Elsevier Interactive Patient Education  [employer].

## 2018-08-31 NOTE — Progress Notes (Signed)
Cardiology Office Note:    Date:  08/31/2018   ID:  Edward Curtis, DOB Jan 08, 1956, MRN [id number]  PCP:  [name], MD  Cardiologist:  Norman Herrlich, MD    Referring MD: [name], MD    ASSESSMENT:    1. Hypertrophic cardiomyopathy (HCC)    PLAN:    In order of problems listed above:  1. Edema he will be evaluated proBNP level echocardiogram but I suspect that this is due to salt loading on top of his calcium channel blocker. 2. And the edema looks typical of calcium channel blocker if unimproved with sodium restriction he may require switching to beta-blocker 3. SVT stable EKG today is sinus tachycardia   Next appointment: 4 weeks after echocardiogram   Medication Adjustments/Labs and Tests Ordered: Current medicines are reviewed at length with the patient today.  Concerns regarding medicines are outlined above.  No orders of the defined types were placed in this encounter.  No orders of the defined types were placed in this encounter.   No chief complaint on file.   History of Present Illness:     Edward Curtis is a 62 y.o. male with a hx of hypertrophic cardiomyopathy and SVT last seen 12/13/2017.  Echocardiogram performed November 22, 2016 showed mild to moderate concentric LVH normal systolic function and no left ventricular outflow tract obstruction.. Compliance with diet, lifestyle and medications: Yes  Earlier this week he had a lower extremity venous duplex done because of edema no evidence of DVT.  Recently had been ill prostatitis took antibiotics and was felt to have been provocative with Cipro.  He also has been having a great deal of dietary sodium.  Not short of breath does not feel sick.  Today when evaluated he has 2+ edema that looks very typical of calcium channel blocker lower extremity edema.  He also has a resting sinus tachycardia 106 bpm and his EKG shows the same pattern bifascicular heart block.  He appears to have developed edema due to  calcium channel blocker and salt loading I asked him to decrease dietary salt and at this time I do not think he needs a diuretic.  For further evaluation I asked him to have a BNP level and echocardiogram with his history of cardiomyopathy left him on a low-dose of calcium channel blocker and if this is unimproved or worsened will need to switch therapy to beta-blocker.  We discussed giving him a low-dose diuretic and he prefers not to.  Had no fever chills or cough and does not feel ill. Past Medical History:  Diagnosis Date  . Allergy    seasonal allergies   . Arthritis   . Asthma   . Blood transfusion without reported diagnosis 1985  . Calcium oxalate renal stones    early 1990's  . Chicken pox   . Chronic neck pain   . Depression   . GERD (gastroesophageal reflux disease)   . Hyperlipidemia 10/10/2013  . Hypertrophic obstructive cardiomyopathy(425.11) 10/10/2013  . IBS (irritable bowel syndrome) 12/07/2011  . Migraines   . Neuromuscular disorder (HCC)    radiculopathy left side   . Obesity (BMI 30-39.9) 10/10/2013  . Seasonal allergies     Past Surgical History:  Procedure Laterality Date  . left knee surgery  1990  . MOUTH SURGERY  1975/1993  . pre cancerous mole  2013  . prostate procedure  1980  . umbilical cyst  1985    Current Medications: Current Meds  Medication Sig  .  acetaminophen (TYLENOL) 500 MG tablet Take 1,000 mg by mouth daily as needed for moderate pain.   [name] aspirin 325 MG tablet Take 325 mg by mouth daily as needed.   [name] aspirin EC 81 MG tablet Take 81 mg by mouth daily.   . diphenhydrAMINE (BENADRYL) 25 mg capsule Take 50 mg by mouth every 6 (six) hours as needed.   [name] EPINEPHrine (EPIPEN 2-PAK) 0.3 mg/0.3 mL SOAJ injection Inject 0.3 mLs (0.3 mg total) into the muscle once as needed (for severe allergic reaction). CAll 911 immediately if you have to use this medicine  . oxyCODONE-acetaminophen (PERCOCET/ROXICET) 5-325 MG tablet Take 1 tablet by mouth  every 6 (six) hours as needed for severe pain.  . Probiotic Product (PROBIOTIC PO) Take 1 tablet by mouth daily.  . tadalafil (CIALIS) 5 MG tablet Take 1 tablet (5 mg total) by mouth daily as needed for erectile dysfunction.  . valACYclovir HCl (VALTREX PO) Take 1 tablet by mouth daily as needed.  . verapamil (CALAN) 80 MG tablet Take 80 mg by mouth daily as needed.  . verapamil (COVERA HS) 180 MG (CO) 24 hr tablet Take 180 mg by mouth daily.      Allergies:   Ciprofloxacin; Penicillins; Iodinated diagnostic agents; Erythromycin; Ibuprofen; Other; and Sulfa antibiotics   Social History   Socioeconomic History  . Marital status: Single    Spouse name: Not on file  . Number of children: Not on file  . Years of education: Not on file  . Highest education level: Not on file  Occupational History  . Not on file  Social Needs  . Financial resource strain: Not on file  . Food insecurity:    Worry: Not on file    Inability: Not on file  . Transportation needs:    Medical: Not on file    Non-medical: Not on file  Tobacco Use  . Smoking status: Never Smoker  . Smokeless tobacco: Never Used  Substance and Sexual Activity  . Alcohol use: Yes    Alcohol/week: 1.0 standard drinks    Types: 1 Glasses of wine per week    Comment: once a month  . Drug use: No  . Sexual activity: Not Currently  Lifestyle  . Physical activity:    Days per week: Not on file    Minutes per session: Not on file  . Stress: Not on file  Relationships  . Social connections:    Talks on phone: Not on file    Gets together: Not on file    Attends religious service: Not on file    Active member of club or organization: Not on file    Attends meetings of clubs or organizations: Not on file    Relationship status: Not on file  Other Topics Concern  . Not on file  Social History Narrative  . Not on file     Family History: The patient's family history includes Arthritis in his mother and paternal  grandmother; Colon cancer in his paternal grandfather; Diabetes in his father, paternal grandfather, and paternal grandmother; Heart disease in his father, mother, and paternal grandfather; Hyperlipidemia in his maternal grandmother and mother; Hypertension in his father, mother, and unknown relative. There is no history of Esophageal cancer, Rectal cancer, or Stomach cancer. ROS:   Please see the history of present illness.    All other systems reviewed and are negative.  EKGs/Labs/Other Studies Reviewed:    The following studies were reviewed today:  EKG:  EKG  ordered today.  The ekg ordered today demonstrates complete right bundle branch block left anterior hemiblock sinus tachycardia  Recent Labs: 11/16/2017: ALT 9; BUN 11; Creatinine, Ser 0.80; Hemoglobin 15.3; Platelets 224.0; Potassium 4.2; Sodium 142; TSH 3.77  Recent Lipid Panel    Component Value Date/Time   CHOL 262 (H) 09/04/2014 0926   TRIG 317.0 (H) 09/04/2014 0926   HDL 27.60 (L) 09/04/2014 0926   CHOLHDL 9 09/04/2014 0926   VLDL 63.4 (H) 09/04/2014 0926   LDLCALC 126 (H) 10/01/2013 1407   LDLDIRECT 151.2 09/04/2014 0926    Physical Exam:    VS:  BP 108/78 (BP Location: Right Arm, Patient Position: Sitting, Cuff Size: Large)   Pulse (!) 108   Ht 5\' 8"  (1.727 m)   Wt 211 lb 6.4 oz (95.9 kg)   SpO2 96%   BMI 32.14 kg/m     Wt Readings from Last 3 Encounters:  08/31/18 211 lb 6.4 oz (95.9 kg)  08/18/18 213 lb 6 oz (96.8 kg)  08/08/18 213 lb 5 oz (96.8 kg)     GEN:  Well nourished, well developed in no acute distress HEENT: Normal NECK: No JVD; No carotid bruits LYMPHATICS: No lymphadenopathy CARDIAC: RRR, no murmurs, rubs, gallops RESPIRATORY:  Clear to auscultation without rales, wheezing or rhonchi  ABDOMEN: Soft, non-tender, non-distended MUSCULOSKELETAL: Plus bilateral DOE to the knee because of calcium channel blocker2 edema; No deformity  SKIN: Warm and dry NEUROLOGIC:  Alert and oriented x  3 PSYCHIATRIC:  Normal affect    Signed, Norman Herrlich, MD  08/31/2018 4:39 PM    [facility]

## 2018-09-01 LAB — COMPREHENSIVE METABOLIC PANEL
A/G RATIO: 1.9 (ref 1.2–2.2)
ALK PHOS: 68 IU/L (ref 39–117)
ALT: 15 IU/L (ref 0–44)
AST: 15 IU/L (ref 0–40)
Albumin: 4.2 g/dL (ref 3.6–4.8)
BILIRUBIN TOTAL: 0.4 mg/dL (ref 0.0–1.2)
BUN/Creatinine Ratio: 12 (ref 10–24)
BUN: 11 mg/dL (ref 8–27)
CO2: 20 mmol/L (ref 20–29)
Calcium: 9.5 mg/dL (ref 8.6–10.2)
Chloride: 106 mmol/L (ref 96–106)
Creatinine, Ser: 0.94 mg/dL (ref 0.76–1.27)
GFR calc Af Amer: 99 mL/min/{1.73_m2} (ref >59)
GFR, EST NON AFRICAN AMERICAN: 86 mL/min/{1.73_m2} (ref >59)
GLOBULIN, TOTAL: 2.2 g/dL (ref 1.5–4.5)
Glucose: 90 mg/dL (ref 65–99)
Potassium: 4 mmol/L (ref 3.5–5.2)
SODIUM: 143 mmol/L (ref 134–144)
Total Protein: 6.4 g/dL (ref 6.0–8.5)

## 2018-09-01 LAB — CBC
HEMATOCRIT: 45.2 % (ref 37.5–51.0)
Hemoglobin: 15.4 g/dL (ref 13.0–17.7)
MCH: 31 pg (ref 26.6–33.0)
MCHC: 34.1 g/dL (ref 31.5–35.7)
MCV: 91 fL (ref 79–97)
PLATELETS: 225 10*3/uL (ref 150–450)
RBC: 4.96 x10E6/uL (ref 4.14–5.80)
RDW: 13 % (ref 12.3–15.4)
WBC: 7.7 10*3/uL (ref 3.4–10.8)

## 2018-09-01 LAB — PRO B NATRIURETIC PEPTIDE: NT-Pro BNP: 43 pg/mL (ref 0–210)

## 2018-09-05 ENCOUNTER — Ambulatory Visit (HOSPITAL_BASED_OUTPATIENT_CLINIC_OR_DEPARTMENT_OTHER)
Admission: RE | Admit: 2018-09-05 | Discharge: 2018-09-05 | Disposition: A | Discharge status: Home / Self Care | Payer: Federal, State, Local not specified - PPO | Source: Ambulatory Visit | Attending: Cardiology | Admitting: Cardiology

## 2018-09-05 DIAGNOSIS — I422 Other hypertrophic cardiomyopathy: Secondary | ICD-10-CM | POA: Diagnosis not present

## 2018-09-05 NOTE — Progress Notes (Signed)
  Echocardiogram 2D Echocardiogram has been performed.  Griselle Rufer T Dyana Magner 09/05/2018, 8:59 AM

## 2018-10-06 ENCOUNTER — Ambulatory Visit: Payer: Federal, State, Local not specified - PPO | Admitting: Cardiology

## 2018-11-17 ENCOUNTER — Ambulatory Visit: Payer: Federal, State, Local not specified - PPO | Admitting: Cardiology

## 2018-11-17 ENCOUNTER — Encounter: Payer: Self-pay | Admitting: Cardiology

## 2018-11-17 VITALS — BP 108/74 | HR 104 | Ht 68.0 in | Wt 217.8 lb

## 2018-11-17 DIAGNOSIS — I422 Other hypertrophic cardiomyopathy: Secondary | ICD-10-CM | POA: Diagnosis not present

## 2018-11-17 DIAGNOSIS — I471 Supraventricular tachycardia: Secondary | ICD-10-CM | POA: Diagnosis not present

## 2018-11-17 MED ORDER — VERAPAMIL HCL ER 180 MG PO TBCR: 180.0000 mg | EXTENDED_RELEASE_TABLET | Freq: Every day | ORAL | 3 refills | Status: DC

## 2018-11-17 MED ORDER — VERAPAMIL HCL 80 MG PO TABS: 80.0000 mg | ORAL_TABLET | Freq: Every day | ORAL | 2 refills | Status: DC | PRN

## 2018-11-17 NOTE — Patient Instructions (Addendum)
Medication Instructions:  Your physician recommends that you continue on your current medications as directed. Please refer to the Current Medication list given to you today.  If you need a refill on your cardiac medications before your next appointment, please call your pharmacy.   Lab work: None  If you have labs (blood work) drawn today and your tests are completely normal, you will receive your results only by: [name] MyChart Message (if you have MyChart) OR . A paper copy in the mail If you have any lab test that is abnormal or we need to change your treatment, we will call you to review the results.  Testing/Procedures: None  Follow-Up: At [facility], you and your health needs are our priority.  As part of our continuing mission to provide you with exceptional heart care, we have created designated Provider Care Teams.  These Care Teams include your primary Cardiologist (physician) and Advanced Practice Providers (APPs -  Physician Assistants and Nurse Practitioners) who all work together to provide you with the care you need, when you need it. You will need a follow up appointment in 1 years.  Please call our office 2 months in advance to schedule this appointment.         1. Avoid all over-the-counter antihistamines except Claritin/Loratadine and Zyrtec/Cetrizine. 2. Avoid all combination including cold sinus allergies flu decongestant and sleep medications 3. You can use Robitussin DM Mucinex and Mucinex DM for cough. 4. can use Tylenol aspirin ibuprofen and naproxen but no combinations such as sleep or sinus.

## 2018-11-17 NOTE — Progress Notes (Signed)
Cardiology Office Note:    Date:  11/17/2018   ID:  Edward Curtis, DOB [birth year], MRN [id number]  PCP:  [name], MD  Cardiologist:  Norman Herrlich, MD    Referring MD: [name], MD    ASSESSMENT:    1. Hypertrophic cardiomyopathy (HCC)   2. HIstory of SVT    PLAN:    In order of problems listed above:  1. Stable asymptomatic New York Heart Association class I recent echocardiogram did not show high risk markers for presence of outflow tract obstruction and continue his calcium channel blocker therapy. 2. Stable no clinical recurrence given instructions avoid over-the-counter proarrhythmic drugs and renewal of his prescription for short acting verapamil to take as needed for breakthrough.   Next appointment: 1 year   Medication Adjustments/Labs and Tests Ordered: Current medicines are reviewed at length with the patient today.  Concerns regarding medicines are outlined above.  No orders of the defined types were placed in this encounter.  Meds ordered this encounter  Medications  . verapamil (CALAN-SR) 180 MG CR tablet    Sig: Take 1 tablet (180 mg total) by mouth daily.    Dispense:  90 tablet    Refill:  3  . verapamil (CALAN) 80 MG tablet    Sig: Take 1 tablet (80 mg total) by mouth daily as needed.    Dispense:  90 tablet    Refill:  2    Chief Complaint  Patient presents with  . Follow-up    for SVT and HCM    History of Present Illness:    Edward Curtis is a 63 y.o. male with a hx of hypertrophic cardiomyopathy and SVT last seen 08/31/2018.  Echocardiogram performed November 22, 2016 showed mild to moderate concentric LVH normal systolic function and no left ventricular outflow tract obstruction..   Compliance with diet, lifestyle and medications: Yes  He needs refills of his medications.  He is frustrated as he had called in December schedule appointment and somehow it was removed from our schedule appropriately we worked him right him and he  seems to be happier.  He still frustrated that Dr. Donnie Aho has retired and misses the previous system and the close personal attention he had received.  I reviewed his echocardiogram with him he had no high risk markers hypertrophic cardiomyopathy and no outflow tract obstruction he has no murmur on exam and is not having exercise intolerance dyspnea chest pain or palpitation.  He has not taken his short acting verapamil in years Past Medical History:  Diagnosis Date  . Allergy    seasonal allergies   . Arthritis   . Asthma   . Blood transfusion without reported diagnosis 1985  . Calcium oxalate renal stones    early 1990's  . Chicken pox   . Chronic neck pain   . Depression   . GERD (gastroesophageal reflux disease)   . Hyperlipidemia 10/10/2013  . Hypertrophic obstructive cardiomyopathy(425.11) 10/10/2013  . IBS (irritable bowel syndrome) 12/07/2011  . Migraines   . Neuromuscular disorder (HCC)    radiculopathy left side   . Obesity (BMI 30-39.9) 10/10/2013  . Seasonal allergies     Past Surgical History:  Procedure Laterality Date  . left knee surgery  1990  . MOUTH SURGERY  1975/1993  . pre cancerous mole  2013  . prostate procedure  1980  . umbilical cyst  1985    Current Medications: Current Meds  Medication Sig  . acetaminophen (TYLENOL) 500 MG  tablet Take 1,000 mg by mouth daily as needed for moderate pain.   [name] aspirin 325 MG tablet Take 325 mg by mouth daily as needed.   . diphenhydrAMINE (BENADRYL) 25 mg capsule Take 50 mg by mouth every 6 (six) hours as needed.   [name] EPINEPHrine (EPIPEN 2-PAK) 0.3 mg/0.3 mL SOAJ injection Inject 0.3 mLs (0.3 mg total) into the muscle once as needed (for severe allergic reaction). CAll 911 immediately if you have to use this medicine  . oxyCODONE-acetaminophen (PERCOCET/ROXICET) 5-325 MG tablet Take 1 tablet by mouth every 6 (six) hours as needed for severe pain.  . Probiotic Product (PROBIOTIC PO) Take 1 tablet by mouth daily as  needed.   . tadalafil (CIALIS) 5 MG tablet Take 1 tablet (5 mg total) by mouth daily as needed for erectile dysfunction.  . valACYclovir HCl (VALTREX PO) Take 1 tablet by mouth daily as needed.  . verapamil (CALAN) 80 MG tablet Take 1 tablet (80 mg total) by mouth daily as needed.  . [DISCONTINUED] verapamil (CALAN) 80 MG tablet Take 80 mg by mouth daily as needed.  . [DISCONTINUED] verapamil (COVERA HS) 180 MG (CO) 24 hr tablet Take 180 mg by mouth daily.      Allergies:   Ciprofloxacin; Penicillins; Iodinated diagnostic agents; Erythromycin; Ibuprofen; Other; and Sulfa antibiotics   Social History   Socioeconomic History  . Marital status: Single    Spouse name: Not on file  . Number of children: Not on file  . Years of education: Not on file  . Highest education level: Not on file  Occupational History  . Not on file  Social Needs  . Financial resource strain: Not on file  . Food insecurity:    Worry: Not on file    Inability: Not on file  . Transportation needs:    Medical: Not on file    Non-medical: Not on file  Tobacco Use  . Smoking status: Never Smoker  . Smokeless tobacco: Never Used  Substance and Sexual Activity  . Alcohol use: Yes    Alcohol/week: 1.0 standard drinks    Types: 1 Glasses of wine per week    Comment: once a month  . Drug use: No  . Sexual activity: Not Currently  Lifestyle  . Physical activity:    Days per week: Not on file    Minutes per session: Not on file  . Stress: Not on file  Relationships  . Social connections:    Talks on phone: Not on file    Gets together: Not on file    Attends religious service: Not on file    Active member of club or organization: Not on file    Attends meetings of clubs or organizations: Not on file    Relationship status: Not on file  Other Topics Concern  . Not on file  Social History Narrative  . Not on file     Family History: The patient's family history includes Arthritis in his mother and  paternal grandmother; Colon cancer in his paternal grandfather; Diabetes in his father, paternal grandfather, and paternal grandmother; Heart disease in his father, mother, and paternal grandfather; Hyperlipidemia in his maternal grandmother and mother; Hypertension in his father, mother, and another family member. There is no history of Esophageal cancer, Rectal cancer, or Stomach cancer. ROS:   Please see the history of present illness.    All other systems reviewed and are negative.  EKGs/Labs/Other Studies Reviewed:    The following studies were  reviewed today:  Recent Labs: 08/31/2018: ALT 15; BUN 11; Creatinine, Ser 0.94; Hemoglobin 15.4; NT-Pro BNP 43; Platelets 225; Potassium 4.0; Sodium 143  Recent Lipid Panel    Component Value Date/Time   CHOL 262 (H) 09/04/2014 0926   TRIG 317.0 (H) 09/04/2014 0926   HDL 27.60 (L) 09/04/2014 0926   CHOLHDL 9 09/04/2014 0926   VLDL 63.4 (H) 09/04/2014 0926   LDLCALC 126 (H) 10/01/2013 1407   LDLDIRECT 151.2 09/04/2014 0926    Physical Exam:    VS:  BP 108/74 (BP Location: Right Arm, Patient Position: Sitting, Cuff Size: Large)   Pulse (!) 104   Ht 5\' 8"  (1.727 m)   Wt 217 lb 12.8 oz (98.8 kg)   SpO2 94%   BMI 33.12 kg/m     Wt Readings from Last 3 Encounters:  11/17/18 217 lb 12.8 oz (98.8 kg)  08/31/18 211 lb 6.4 oz (95.9 kg)  08/18/18 213 lb 6 oz (96.8 kg)     GEN:  Well nourished, well developed in no acute distress HEENT: Normal NECK: No JVD; No carotid bruits LYMPHATICS: No lymphadenopathy CARDIAC: RRR, no murmurs, rubs, gallops RESPIRATORY:  Clear to auscultation without rales, wheezing or rhonchi  ABDOMEN: Soft, non-tender, non-distended MUSCULOSKELETAL:  No edema; No deformity  SKIN: Warm and dry NEUROLOGIC:  Alert and oriented x 3 PSYCHIATRIC:  Normal affect    Signed, Norman HerrlichBrian , MD  11/17/2018 9:14 AM    [facility]

## 2019-01-04 ENCOUNTER — Encounter: Payer: Self-pay | Admitting: Family Medicine

## 2019-01-04 ENCOUNTER — Ambulatory Visit: Payer: Federal, State, Local not specified - PPO | Admitting: Family Medicine

## 2019-01-04 ENCOUNTER — Other Ambulatory Visit: Payer: Self-pay

## 2019-01-04 VITALS — BP 98/62 | HR 104 | Temp 98.6°F | Wt 215.0 lb

## 2019-01-04 DIAGNOSIS — M79645 Pain in left finger(s): Secondary | ICD-10-CM

## 2019-01-04 DIAGNOSIS — T148XXA Other injury of unspecified body region, initial encounter: Secondary | ICD-10-CM | POA: Diagnosis not present

## 2019-01-04 MED ORDER — DOXYCYCLINE HYCLATE 100 MG PO TABS: 100.0000 mg | ORAL_TABLET | Freq: Two times a day (BID) | ORAL | 0 refills | Status: AC

## 2019-01-04 NOTE — Progress Notes (Signed)
Subjective:    Patient ID: Edward Curtis, male    DOB: 07/27/1956, 63 y.o.   MRN: [id number]  No chief complaint on file.   HPI Patient was seen today for acute injury.  Pt endorses stapling his left arm this morning around 11:15 am.  Pt was trying to get a malfunctioning stapler to work when he inadvertently injured his thumb.  Pt states the staple was lodged deep in his finger, he removed the staple, and later cleaned his thumb.  Pt endorses mild pain with palpation.  Pt made appointment as he was unsure of his last tetanus shot.  Per chart review last tetanus 12/27/2013.  Update:  Pt states previously seen by this provider and several others for rash in 06/2017.  Has allergy testing.  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 Past Medical History:  Diagnosis Date  . Allergy    seasonal allergies   . Arthritis   . Asthma   . Blood transfusion without reported diagnosis 1985  . Calcium oxalate renal stones    early 1990's  . Chicken pox   . Chronic neck pain   . Depression   . GERD (gastroesophageal reflux disease)   . Hyperlipidemia 10/10/2013  . Hypertrophic obstructive cardiomyopathy(425.11) 10/10/2013  . IBS (irritable bowel syndrome) 12/07/2011  . Migraines   . Neuromuscular disorder (HCC)    radiculopathy left side   . Obesity (BMI 30-39.9) 10/10/2013  . Seasonal allergies     Allergies  Allergen Reactions  . Ciprofloxacin Other (See Comments)    Possible "leg swelling, pain, unable to walk"  . Penicillins Hives    Has patient had a PCN reaction causing immediate rash, facial/tongue/throat swelling, SOB or lightheadedness with hypotension: Yes Has patient had a PCN reaction causing severe rash involving mucus membranes or skin necrosis: No Has patient had a PCN reaction that required hospitalization: Yes Has patient had a PCN reaction occurring within the last 10 years: No If all of the above answers are "NO", then may  proceed with Cephalosporin use.   . Iodinated Diagnostic Agents Hives and Itching    Patient must be premedicated with 13 hour protocol prior to IV contrast administration  . Erythromycin     emycin and clindamycin with nausea  . Ibuprofen Other (See Comments)    Causes tachycardia  . Other Other (See Comments)     Walnut trees and walnuts   . Sulfa Antibiotics Hives    ROS General: Denies fever, chills, night sweats, changes in weight, changes in appetite HEENT: Denies headaches, ear pain, changes in vision, rhinorrhea, sore throat CV: Denies CP, palpitations, SOB, orthopnea Pulm: Denies SOB, cough, wheezing GI: Denies abdominal pain, nausea, vomiting, diarrhea, constipation GU: Denies dysuria, hematuria, frequency, vaginal discharge Msk: Denies muscle cramps, joint pains Neuro: Denies weakness, numbness, tingling Skin: Denies rashes, bruising  +staple wound L thumb Psych: Denies depression, anxiety, hallucinations    Objective:    Blood pressure 98/62, pulse (!) 104, temperature 98.6 F (37 C), temperature source Oral, weight 215 lb (97.5 kg), SpO2 96 %.  Gen. Pleasant, well-nourished, in no distress, normal affect   Lungs: no accessory muscle use, CTAB, no wheezes or rales Cardiovascular: RRR, no peripheral edema Musculoskeletal: FROM of L thumb, No deformities, no cyanosis or clubbing, normal tone Skin:  Warm, no rash.  L thumb with two puncture wounds, no erythema, induration, or drainage.       Wt Readings from Last 3 Encounters:  11/17/18 217 lb 12.8 oz (98.8 kg)  08/31/18 211 lb 6.4 oz (95.9 kg)  08/18/18 213 lb 6 oz (96.8 kg)    Lab Results  Component Value Date   WBC 7.7 08/31/2018   HGB 15.4 08/31/2018   HCT 45.2 08/31/2018   PLT 225 08/31/2018   GLUCOSE 90 08/31/2018   CHOL 262 (H) 09/04/2014   TRIG 317.0 (H) 09/04/2014   HDL 27.60 (L) 09/04/2014   LDLDIRECT 151.2 09/04/2014   LDLCALC 126 (H) 10/01/2013   ALT 15 08/31/2018   AST 15 08/31/2018    NA 143 08/31/2018   K 4.0 08/31/2018   CL 106 08/31/2018   CREATININE 0.94 08/31/2018   BUN 11 08/31/2018   CO2 20 08/31/2018   TSH 3.77 11/16/2017   HGBA1C 5.4 09/04/2014    Assessment/Plan:  Pain of finger of left hand -2/2 staple wound -ok to use Ice, NSAIDs prn  Puncture wound  -tetanus up to date, done 12/27/2013 -discussed keeping area clean and dry.  Monitor for s/s of infection. -given handout -given RTC or ED precuations -will start abx given deep wound.  Pt has multiple drug allergies. - Plan: doxycycline (VIBRA-TABS) 100 MG tablet  F/u prn  Abbe Amsterdam, MD

## 2019-01-04 NOTE — Patient Instructions (Signed)
Puncture Wound  A puncture wound is an injury that is caused by a sharp, thin object that goes through your skin. A puncture wound usually does not leave a large opening in your skin, so it may not bleed a lot. However, when you get a puncture wound, dirt or other materials (foreign bodies) can be forced into your wound and can break off inside. This increases the chance of infection, such as tetanus. There are many sharp, pointed objects that can cause puncture wounds, including teeth, nails, splinters of glass, fishhooks, and needles.  Treatment may include the following steps:  · Washing out the wound with a germ-free (sterile) salt-water solution.  · Having surgery to open the wound and remove materials from it.  · Closing the wound with stitches (sutures).  · Covering the wound with antibiotic ointment and a bandage (dressing).  Depending on what caused the injury, you may also need a tetanus shot or a rabies shot.  Follow these instructions at home:  Medicines  · Take or apply over-the-counter and prescription medicines only as told by your doctor.  · If you were prescribed an antibiotic medicine, take or apply it as told by your doctor. Do not stop using the antibiotic even if your condition starts to get better.  Bathing  · Keep the bandage dry as told by your doctor.  · Do not take baths, swim, or use a hot tub until your doctor approves. Ask your doctor if you may take showers. You may only be allowed to take sponge baths.  Wound care    · There are many ways to close and cover a wound. For example, a wound can be closed with stitches, skin glue, or skin tape (adhesive strips). Follow instructions from your doctor about how to take care of your wound. Make sure you:  ? Wash your hands with soap and water before and after you change your bandage. If you cannot use soap and water, use hand sanitizer.  ? Change your bandage as told by your doctor.  ? Leave stitches, skin glue, or skin tape strips in place.  They may need to stay in place for 2 weeks or longer. If tape strips get loose and curl up, you may trim the loose edges. Do not remove tape strips completely unless your doctor says it is okay.  · Clean the wound as told by your doctor.  · Do not scratch or pick at the wound.  · Check your wound every day for signs of infection. Watch for:  ? Redness, swelling, or pain.  ? Fluid or blood.  ? Warmth.  ? Pus or a bad smell.  General instructions  · Raise (elevate) the injured area above the level of your heart while you are sitting or lying down.  · If your puncture wound is in your foot, ask your doctor if you need to avoid putting weight on your foot and for how long. Use crutches as told by your doctor.  · Keep all follow-up visits as told by your doctor. This is important.  Contact a doctor if:  · You got a tetanus shot and you have any of these problems at the injection site:  ? Swelling.  ? Very bad pain.  ? Redness.  ? Bleeding.  · You have a fever.  · Your stitches come out.  · You notice a bad smell coming from your wound or your bandage.  · You notice something coming out of the   wound, such as wood or glass.  · Medicine does not help your pain.  · You have more redness, swelling, or pain at the site of your wound.  · You have fluid, blood, or pus coming from your wound.  · You notice a change in the color of your skin near your wound.  · You need to change the bandage often because fluid, blood, or pus is coming from the wound.  · You start to have a new rash.  · You start to lose feeling (have numbness) around the wound.  · You have warmth around your wound.  Get help right away if:  · You have very bad swelling around the wound.  · Your pain quickly gets worse and is very bad.  · You start to get painful skin lumps.  · You have a red streak going away from your wound.  · The wound is on your hand or foot and you:  ? Cannot move a finger or toe like normal.  ? Notice that your fingers or toes look pale or  blue.  Summary  · A puncture wound is an injury that is caused by a sharp, thin object that goes through your skin.  · Treatment may include washing out the wound, having surgery to open the wound to clean it, closing the wound, and covering the wound with a bandage.  · Follow instructions from your doctor about how to take care of your wound.  · Contact your doctor if you have more redness, swelling, or pain at the site of your wound.  · Keep all follow-up visits as told by your doctor. This is important.  This information is not intended to replace advice given to you by your health care provider. Make sure you discuss any questions you have with your health care provider.  Document Released: 07/20/2008 Document Revised: 05/18/2018 Document Reviewed: 05/18/2018  [employer] Interactive Patient Education © 2019 [employer]

## 2019-01-12 DIAGNOSIS — D1801 Hemangioma of skin and subcutaneous tissue: Secondary | ICD-10-CM | POA: Diagnosis not present

## 2019-01-12 DIAGNOSIS — L570 Actinic keratosis: Secondary | ICD-10-CM | POA: Diagnosis not present

## 2019-01-12 DIAGNOSIS — L821 Other seborrheic keratosis: Secondary | ICD-10-CM | POA: Diagnosis not present

## 2019-01-12 DIAGNOSIS — D225 Melanocytic nevi of trunk: Secondary | ICD-10-CM | POA: Diagnosis not present

## 2019-01-12 DIAGNOSIS — D2272 Melanocytic nevi of left lower limb, including hip: Secondary | ICD-10-CM | POA: Diagnosis not present

## 2019-03-08 ENCOUNTER — Encounter: Payer: Self-pay | Admitting: Cardiology

## 2019-03-08 ENCOUNTER — Telehealth: Payer: Self-pay | Admitting: Cardiology

## 2019-03-08 ENCOUNTER — Telehealth (INDEPENDENT_AMBULATORY_CARE_PROVIDER_SITE_OTHER): Payer: Federal, State, Local not specified - PPO | Admitting: Cardiology

## 2019-03-08 ENCOUNTER — Encounter: Payer: Self-pay | Admitting: *Deleted

## 2019-03-08 ENCOUNTER — Other Ambulatory Visit: Payer: Self-pay

## 2019-03-08 DIAGNOSIS — R0789 Other chest pain: Secondary | ICD-10-CM

## 2019-03-08 DIAGNOSIS — R079 Chest pain, unspecified: Secondary | ICD-10-CM

## 2019-03-08 MED ORDER — NITROGLYCERIN 0.4 MG SL SUBL: 0.4000 mg | SUBLINGUAL_TABLET | SUBLINGUAL | 3 refills | Status: DC | PRN

## 2019-03-08 NOTE — Telephone Encounter (Signed)
Virtual Visit Pre-Appointment Phone Call  "(Name), I am calling you today to discuss your upcoming appointment. We are currently trying to limit exposure to the virus that causes COVID-19 by seeing patients at home rather than in the office."  1. "What is the BEST phone number to call the day of the visit?" - include this in appointment notes  2. Do you have or have access to (through a family member/friend) a smartphone with video capability that we can use for your visit?" a. If yes - list this number in appt notes as cell (if different from BEST phone #) and list the appointment type as a VIDEO visit in appointment notes b. If no - list the appointment type as a PHONE visit in appointment notes  Confirm consent - "In the setting of the current Covid19 crisis, you are scheduled for a (phone or video) visit with your provider on (date) at (time).  Just as we do with many in-office visits, in order for you to participate in this visit, we must obtain consent.  If you'd like, I can send this to your mychart (if signed up) or email for you to review.  Otherwise, I can obtain your verbal consent now.  All virtual visits are billed to your insurance company just like a normal visit would be.  By agreeing to a virtual visit, we'd like you to understand that the technology does not allow for your provider to perform an examination, and thus may limit your provider's ability to fully assess your condition. If your provider identifies any concerns that need to be evaluated in person, we will make arrangements to do so.  Finally, though the technology is pretty good, we cannot assure that it will always work on either your or our end, and in the setting of a video visit, we may have to convert it to a phone-only visit.  In either situation, we cannot ensure that we have a secure connection.  Are you willing to proceed?" STAFF: Did the patient verbally acknowledge consent to telehealth visit? Document  YES/NO here: YES 3. Advise patient to be prepared - "Two hours prior to your appointment, go ahead and check your blood pressure, pulse, oxygen saturation, and your weight (if you have the equipment to check those) and write them all down. When your visit starts, your provider will ask you for this information. If you have an Apple Watch or Kardia device, please plan to have heart rate information ready on the day of your appointment. Please have a pen and paper handy nearby the day of the visit as well."  4. Give patient instructions for MyChart download to smartphone OR Doximity/Doxy.me as below if video visit (depending on what platform provider is using)  5. Inform patient they will receive a phone call 15 minutes prior to their appointment time (may be from unknown caller ID) so they should be prepared to answer    TELEPHONE CALL NOTE  Edward JeffersonRichard Curtis has been deemed a candidate for a follow-up tele-health visit to limit community exposure during the Covid-19 pandemic. I spoke with the patient via phone to ensure availability of phone/video source, confirm preferred email & phone number, and discuss instructions and expectations.  I reminded Edward Jeffersonichard Boehler to be prepared with any vital sign and/or heart rhythm information that could potentially be obtained via home monitoring, at the time of his visit. I reminded Edward JeffersonRichard Ware to expect a phone call prior to his visit.  Samella Parrenise Wilson  03/08/2019 9:22 AM   INSTRUCTIONS FOR DOWNLOADING THE MYCHART APP TO SMARTPHONE  - The patient must first make sure to have activated MyChart and know their login information - If Apple, go to [employer] and type in MyChart in the search bar and download the app. If Android, ask patient to go to [employer] and type in Seltzer in the search bar and download the app. The app is free but as with any other app downloads, their phone may require them to verify saved payment information or Apple/Android password.  -  The patient will need to then log into the app with their MyChart username and password, and select  as their healthcare provider to link the account. When it is time for your visit, go to the MyChart app, find appointments, and click Begin Video Visit. Be sure to Select Allow for your device to access the Microphone and Camera for your visit. You will then be connected, and your provider will be with you shortly.  **If they have any issues connecting, or need assistance please contact MyChart service desk [phone])**  **If using a computer, in order to ensure the best quality for their visit they will need to use either of the following Internet Browsers: [employer] or Google Chrome**  IF USING DOXIMITY or DOXY.ME - The patient will receive a link just prior to their visit by text.     FULL LENGTH CONSENT FOR TELE-HEALTH VISIT   I hereby voluntarily request, consent and authorize [facility] and its employed or contracted physicians, physician assistants, nurse practitioners or other licensed health care professionals (the Practitioner), to provide me with telemedicine health care services (the Services") as deemed necessary by the treating Practitioner. I acknowledge and consent to receive the Services by the Practitioner via telemedicine. I understand that the telemedicine visit will involve communicating with the Practitioner through live audiovisual communication technology and the disclosure of certain medical information by electronic transmission. I acknowledge that I have been given the opportunity to request an in-person assessment or other available alternative prior to the telemedicine visit and am voluntarily participating in the telemedicine visit.  I understand that I have the right to withhold or withdraw my consent to the use of telemedicine in the course of my care at any time, without affecting my right to future care or treatment, and that the  Practitioner or I may terminate the telemedicine visit at any time. I understand that I have the right to inspect all information obtained and/or recorded in the course of the telemedicine visit and may receive copies of available information for a reasonable fee.  I understand that some of the potential risks of receiving the Services via telemedicine include:   Delay or interruption in medical evaluation due to technological equipment failure or disruption;  Information transmitted may not be sufficient (e.g. poor resolution of images) to allow for appropriate medical decision making by the Practitioner; and/or   In rare instances, security protocols could fail, causing a breach of personal health information.  Furthermore, I acknowledge that it is my responsibility to provide information about my medical history, conditions and care that is complete and accurate to the best of my ability. I acknowledge that Practitioner's advice, recommendations, and/or decision may be based on factors not within their control, such as incomplete or inaccurate data provided by me or distortions of diagnostic images or specimens that may result from electronic transmissions. I understand that the practice of medicine  is not an Chief Strategy Officer and that Practitioner makes no warranties or guarantees regarding treatment outcomes. I acknowledge that I will receive a copy of this consent concurrently upon execution via email to the email address I last provided but may also request a printed copy by calling the office of [place].    I understand that my insurance will be billed for this visit.   I have read or had this consent read to me.  I understand the contents of this consent, which adequately explains the benefits and risks of the Services being provided via telemedicine.   I have been provided ample opportunity to ask questions regarding this consent and the Services and have had my questions answered to my  satisfaction.  I give my informed consent for the services to be provided through the use of telemedicine in my medical care  By participating in this telemedicine visit I agree to the above.

## 2019-03-08 NOTE — Progress Notes (Signed)
Virtual Visit via Video Note   This visit type was conducted due to national recommendations for restrictions regarding the COVID-19 Pandemic (e.g. social distancing) in an effort to limit this patient's exposure and mitigate transmission in our community.  Due to his co-morbid illnesses, this patient is at least at moderate risk for complications without adequate follow up.  This format is felt to be most appropriate for this patient at this time.  All issues noted in this document were discussed and addressed.  A limited physical exam was performed with this format.  Please refer to the patient's chart for his consent to telehealth for [facility].   Date:  03/08/2019   ID:  Edward Curtis, DOB April 05, 1956, MRN [id number]  Patient Location: Home Provider Location: Office  PCP:  [name], MD  Cardiologist:  No primary care provider on file.  Electrophysiologist:  None   Evaluation Performed:  Follow-Up Visit  Chief Complaint:  Chest discomfort   History of Present Illness:    Edward Curtis is a 64 y.o. male with past medical history of essential hypertension.  He was told of hypertrophic cardiomyopathy in the past but the last echocardiogram did not reveal any significant such finding.  He mentions to me that when he exerts himself he has chest tightness.  No radiation to the neck or to the arms.  He leads a sedentary lifestyle and does not exercise on a regular basis.  At the time of my evaluation, the patient is alert awake oriented and in no distress.  The patient does not have symptoms concerning for COVID-19 infection (fever, chills, cough, or new shortness of breath).    Past Medical History:  Diagnosis Date  . Allergy    seasonal allergies   . Arthritis   . Asthma   . Blood transfusion without reported diagnosis 1985  . Calcium oxalate renal stones    early 1990's  . Chicken pox   . Chronic neck pain   . Depression   . GERD (gastroesophageal reflux disease)   .  Hyperlipidemia 10/10/2013  . Hypertrophic obstructive cardiomyopathy(425.11) 10/10/2013  . IBS (irritable bowel syndrome) 12/07/2011  . Migraines   . Neuromuscular disorder (HCC)    radiculopathy left side   . Obesity (BMI 30-39.9) 10/10/2013  . Seasonal allergies    Past Surgical History:  Procedure Laterality Date  . left knee surgery  1990  . MOUTH SURGERY  1975/1993  . pre cancerous mole  2013  . prostate procedure  1980  . umbilical cyst  1985     Current Meds  Medication Sig  . acetaminophen (TYLENOL) 500 MG tablet Take 1,000 mg by mouth daily as needed for moderate pain.   [name] aspirin 325 MG tablet Take 325 mg by mouth daily as needed.   . diphenhydrAMINE (BENADRYL) 25 mg capsule Take 50 mg by mouth every 6 (six) hours as needed.   [name] EPINEPHrine (EPIPEN 2-PAK) 0.3 mg/0.3 mL SOAJ injection Inject 0.3 mLs (0.3 mg total) into the muscle once as needed (for severe allergic reaction). CAll 911 immediately if you have to use this medicine  . loratadine (CLARITIN) 10 MG tablet Take 10 mg by mouth daily.  [name] oxyCODONE-acetaminophen (PERCOCET/ROXICET) 5-325 MG tablet Take 1 tablet by mouth every 6 (six) hours as needed for severe pain.  . polyethylene glycol (MIRALAX / GLYCOLAX) 17 g packet Take 17 g by mouth daily as needed.  . Probiotic Product (PROBIOTIC PO) Take 1 tablet by mouth daily as  needed.   . valACYclovir HCl (VALTREX PO) Take 1 tablet by mouth daily as needed.  . verapamil (CALAN) 80 MG tablet Take 1 tablet (80 mg total) by mouth daily as needed.  . verapamil (CALAN-SR) 180 MG CR tablet Take 1 tablet (180 mg total) by mouth daily.  . [DISCONTINUED] tadalafil (CIALIS) 5 MG tablet Take 1 tablet (5 mg total) by mouth daily as needed for erectile dysfunction.     Allergies:   Ciprofloxacin; Penicillins; Iodinated diagnostic agents; Erythromycin; Ibuprofen; Latex; Other; and Sulfa antibiotics   Social History   Tobacco Use  . Smoking status: Never Smoker  . Smokeless  tobacco: Never Used  Substance Use Topics  . Alcohol use: Yes    Alcohol/week: 1.0 standard drinks    Types: 1 Glasses of wine per week    Comment: once a month  . Drug use: No     Family Hx: The patient's family history includes Arthritis in his mother and paternal grandmother; Colon cancer in his paternal grandfather; Diabetes in his father, paternal grandfather, and paternal grandmother; Heart disease in his father, mother, and paternal grandfather; Hyperlipidemia in his maternal grandmother and mother; Hypertension in his father, mother, and another family member. There is no history of Esophageal cancer, Rectal cancer, or Stomach cancer.  ROS:   Please see the history of present illness.    As above All other systems reviewed and are negative.   Prior CV studies:   The following studies were reviewed today:  I reviewed echocardiogram results from last time and discussed with him  Labs/Other Tests and Data Reviewed:    EKG:  No ECG reviewed.  Recent Labs: 08/31/2018: ALT 15; BUN 11; Creatinine, Ser 0.94; Hemoglobin 15.4; NT-Pro BNP 43; Platelets 225; Potassium 4.0; Sodium 143   Recent Lipid Panel Lab Results  Component Value Date/Time   CHOL 262 (H) 09/04/2014 09:26 AM   TRIG 317.0 (H) 09/04/2014 09:26 AM   HDL 27.60 (L) 09/04/2014 09:26 AM   CHOLHDL 9 09/04/2014 09:26 AM   LDLCALC 126 (H) 10/01/2013 02:07 PM   LDLDIRECT 151.2 09/04/2014 09:26 AM    Wt Readings from Last 3 Encounters:  03/08/19 212 lb (96.2 kg)  01/04/19 215 lb (97.5 kg)  11/17/18 217 lb 12.8 oz (98.8 kg)     Objective:    Vital Signs:  Ht 5\' 8"  (1.727 m)   Wt 212 lb (96.2 kg)   BMI 32.23 kg/m    VITAL SIGNS:  reviewed  ASSESSMENT & PLAN:    1. Chest discomfort: The patient symptoms are of concern.  He has risk factors for coronary artery disease.  Following recommendations were made.  Sublingual nitroglycerin prescription was sent, its protocol and 911 protocol explained and the patient  vocalized understanding questions were answered to the patient's satisfaction.  In view of his symptoms we will set him up for a Lexiscan sestamibi.  Patient was also advised to take a coated 81 mg aspirin on a daily basis.  He was advised to purchase a blood pressure machine and keep a track of his pulse and blood pressures twice a day and provide [place] that log in the next week or so.  He needs to get his lipids checked by his primary care physician and he agrees to do that. 2. He will be seen in follow-up appointment by Dr. Dulce Sellar who takes care of him on a regular basis.  He knows to go to the nearest emergency room for any significant concerns.  Patient had multiple questions which were answered to his satisfaction.  We would like to schedule his Lexiscan in [place] in the next week or 2.  COVID-19 Education: The signs and symptoms of COVID-19 were discussed with the patient and how to seek care for testing (follow up with PCP or arrange E-visit).  The importance of social distancing was discussed today.  Time:   Today, I have spent 15 minutes with the patient with telehealth technology discussing the above problems.     Medication Adjustments/Labs and Tests Ordered: Current medicines are reviewed at length with the patient today.  Concerns regarding medicines are outlined above.   Tests Ordered: No orders of the defined types were placed in this encounter.   Medication Changes: No orders of the defined types were placed in this encounter.   Disposition:  Follow up in 1 month(s)  Signed, Garwin Brothersajan R Elyse Prevo, MD  03/08/2019 3:52 PM    [facility]

## 2019-03-08 NOTE — Addendum Note (Signed)
Addended by: Fayrene Fearing B on: 03/08/2019 04:28 PM   Modules accepted: Orders

## 2019-03-08 NOTE — Patient Instructions (Addendum)
Medication Instructions:  Your physician has recommended you make the following change in your medication:   START Nitroglycerin 0.4 mg ...[name].[name].When having chest pain, stop what you are doing and sit down. Take 1 nitro, wait 5 minutes. Still having chest pain, take 1 nitro, wait 5 minutes. Still having chest pain, take 1 nitro, dial 911. Total of 3 nitro in 15 minutes.  If you need a refill on your cardiac medications before your next appointment, please call your pharmacy.   Lab work: None If you have labs (blood work) drawn today and your tests are completely normal, you will receive your results only by: [name]. MyChart Message (if you have MyChart) OR . A paper copy in the mail If you have any lab test that is abnormal or we need to change your treatment, we will call you to review the results.  Testing/Procedures: Your physician has requested that you have a lexiscan myoview. For further information please visit [email or web address]. Please follow instruction sheet, as given.    [facility] [address], [address] [place], [place] [address] Phone: [phone]  Mar 08, 2019    Edward JeffersonRichard Cea DOB: 1955-12-07 MRN: [id number] [address] [place] [id number]   Dear [name],  You are scheduled for a Myocardial Perfusion Imaging Study on:   at .  Please arrive 15 minutes prior to your appointment time for registration and insurance purposes.  The test will take approximately 3 to 4 hours to complete; you may bring reading material.  If someone comes with you to your appointment, they will need to remain in the main lobby due to limited space in the testing area. **If you are pregnant or breastfeeding, please notify the nuclear lab prior to your appointment**  How to prepare for your Myocardial Perfusion Test: . Do not eat or drink 3 hours prior to your test, except you may have water. . Do not consume products containing caffeine  (regular or decaffeinated) 12 hours prior to your test. (ex: coffee, chocolate, sodas, tea). . Do bring a list of your current medications with you.  If not listed below, you may take your medications as normal. . Do wear comfortable clothes (no dresses or overalls) and walking shoes, tennis shoes preferred (No heels or open toe shoes are allowed). . Do NOT wear cologne, perfume, aftershave, or lotions (deodorant is allowed). . If these instructions are not followed, your test will have to be rescheduled.  Please report to [address], [address] for your test.  If you have questions or concerns about your appointment, you can call the Nuclear Lab at [phone].  If you cannot keep your appointment, please provide 24 hours notification to the Nuclear Lab, to avoid a possible $50 charge to your account. Follow-Up: At [facility], you and your health needs are our priority.  As part of our continuing mission to provide you with exceptional heart care, we have created designated Provider Care Teams.  These Care Teams include your primary Cardiologist (physician) and Advanced Practice Providers (APPs -  Physician Assistants and Nurse Practitioners) who all work together to provide you with the care you need, when you need it. You will need a follow up appointment in 1 months.   Any Other Special Instructions Will Be Listed Below (If Applicable).  Nitroglycerin sublingual tablets What is this medicine? NITROGLYCERIN (nye troe GLI ser in) is a type of vasodilator. It relaxes blood vessels, increasing the blood and  oxygen supply to your heart. This medicine is used to relieve chest pain caused by angina. It is also used to prevent chest pain before activities like climbing stairs, going outdoors in cold weather, or sexual activity. This medicine may be used for other purposes; ask your health care provider or pharmacist if you have questions. COMMON BRAND NAME(S): Nitroquick, Nitrostat,  Nitrotab What should I tell my health care provider before I take this medicine? They need to know if you have any of these conditions: -anemia -head injury, recent stroke, or bleeding in the brain -liver disease -previous heart attack -an unusual or allergic reaction to nitroglycerin, other medicines, foods, dyes, or preservatives -pregnant or trying to get pregnant -breast-feeding How should I use this medicine? Take this medicine by mouth as needed. At the first sign of an angina attack (chest pain or tightness) place one tablet under your tongue. You can also take this medicine 5 to 10 minutes before an event likely to produce chest pain. Follow the directions on the prescription label. Let the tablet dissolve under the tongue. Do not swallow whole. Replace the dose if you accidentally swallow it. It will help if your mouth is not dry. Saliva around the tablet will help it to dissolve more quickly. Do not eat or drink, smoke or chew tobacco while a tablet is dissolving. If you are not better within 5 minutes after taking ONE dose of nitroglycerin, call 9-1-1 immediately to seek emergency medical care. Do not take more than 3 nitroglycerin tablets over 15 minutes. If you take this medicine often to relieve symptoms of angina, your doctor or health care professional may provide you with different instructions to manage your symptoms. If symptoms do not go away after following these instructions, it is important to call 9-1-1 immediately. Do not take more than 3 nitroglycerin tablets over 15 minutes. Talk to your pediatrician regarding the use of this medicine in children. Special care may be needed. Overdosage: If you think you have taken too much of this medicine contact a poison control center or emergency room at once. NOTE: This medicine is only for you. Do not share this medicine with others. What if I miss a dose? This does not apply. This medicine is only used as needed. What may interact  with this medicine? Do not take this medicine with any of the following medications: -certain migraine medicines like ergotamine and dihydroergotamine (DHE) -medicines used to treat erectile dysfunction like sildenafil, tadalafil, and vardenafil -riociguat This medicine may also interact with the following medications: -alteplase -aspirin -heparin -medicines for high blood pressure -medicines for mental depression -other medicines used to treat angina -phenothiazines like chlorpromazine, mesoridazine, prochlorperazine, thioridazine This list may not describe all possible interactions. Give your health care provider a list of all the medicines, herbs, non-prescription drugs, or dietary supplements you use. Also tell them if you smoke, drink alcohol, or use illegal drugs. Some items may interact with your medicine. What should I watch for while using this medicine? Tell your doctor or health care professional if you feel your medicine is no longer working. Keep this medicine with you at all times. Sit or lie down when you take your medicine to prevent falling if you feel dizzy or faint after using it. Try to remain calm. This will help you to feel better faster. If you feel dizzy, take several deep breaths and lie down with your feet propped up, or bend forward with your head resting between your knees. You may  get drowsy or dizzy. Do not drive, use machinery, or do anything that needs mental alertness until you know how this drug affects you. Do not stand or sit up quickly, especially if you are an older patient. This reduces the risk of dizzy or fainting spells. Alcohol can make you more drowsy and dizzy. Avoid alcoholic drinks. Do not treat yourself for coughs, colds, or pain while you are taking this medicine without asking your doctor or health care professional for advice. Some ingredients may increase your blood pressure. What side effects may I notice from receiving this medicine? Side  effects that you should report to your doctor or health care professional as soon as possible: -blurred vision -dry mouth -skin rash -sweating -the feeling of extreme pressure in the head -unusually weak or tired Side effects that usually do not require medical attention (report to your doctor or health care professional if they continue or are bothersome): -flushing of the face or neck -headache -irregular heartbeat, palpitations -nausea, vomiting This list may not describe all possible side effects. Call your doctor for medical advice about side effects. You may report side effects to FDA at 1-800-FDA-1088. Where should I keep my medicine? Keep out of the reach of children. Store at room temperature between 20 and 25 degrees C (68 and 77 degrees F). Store in Retail buyer. Protect from light and moisture. Keep tightly closed. Throw away any unused medicine after the expiration date. NOTE: This sheet is a summary. It may not cover all possible information. If you have questions about this medicine, talk to your doctor, pharmacist, or health care provider.  2019 Elsevier/Gold Standard (2013-08-09 17:57:36)   Cardiac Nuclear Scan A cardiac nuclear scan is a test that is done to check the flow of blood to your heart. It is done when you are resting and when you are exercising. The test looks for problems such as:  Not enough blood reaching a portion of the heart.  The heart muscle not working as it should. You may need this test if:  You have heart disease.  You have had lab results that are not normal.  You have had heart surgery or a balloon procedure to open up blocked arteries (angioplasty).  You have chest pain.  You have shortness of breath. In this test, a special dye (tracer) is put into your bloodstream. The tracer will travel to your heart. A camera will then take pictures of your heart to see how the tracer moves through your heart. This test is usually done at a  hospital and takes 2-4 hours. Tell a doctor about:  Any allergies you have.  All medicines you are taking, including vitamins, herbs, eye drops, creams, and over-the-counter medicines.  Any problems you or family members have had with anesthetic medicines.  Any blood disorders you have.  Any surgeries you have had.  Any medical conditions you have.  Whether you are pregnant or may be pregnant. What are the risks? Generally, this is a safe test. However, problems may occur, such as:  Serious chest pain and heart attack. This is only a risk if the stress portion of the test is done.  Rapid heartbeat.  A feeling of warmth in your chest. This feeling usually does not last long.  Allergic reaction to the tracer. What happens before the test?  Ask your doctor about changing or stopping your normal medicines. This is important.  Follow instructions from your doctor about what you cannot eat or drink.  Remove your jewelry on the day of the test. What happens during the test?  An IV tube will be inserted into one of your veins.  Your doctor will give you a small amount of tracer through the IV tube.  You will wait for 20-40 minutes while the tracer moves through your bloodstream.  Your heart will be monitored with an electrocardiogram (ECG).  You will lie down on an exam table.  Pictures of your heart will be taken for about 15-20 minutes.  You may also have a stress test. For this test, one of these things may be done: ? You will be asked to exercise on a treadmill or a stationary bike. ? You will be given medicines that will make your heart work harder. This is done if you are unable to exercise.  When blood flow to your heart has peaked, a tracer will again be given through the IV tube.  After 20-40 minutes, you will get back on the exam table. More pictures will be taken of your heart.  Depending on the tracer that is used, more pictures may need to be taken 3-4  hours later.  Your IV tube will be removed when the test is over. The test may vary among doctors and hospitals. What happens after the test?  Ask your doctor: ? Whether you can return to your normal schedule, including diet, activities, and medicines. ? Whether you should drink more fluids. This will help to remove the tracer from your body. Drink enough fluid to keep your pee (urine) pale yellow.  Ask your doctor, or the department that is doing the test: ? When will my results be ready? ? How will I get my results? Summary  A cardiac nuclear scan is a test that is done to check the flow of blood to your heart.  Tell your doctor whether you are pregnant or may be pregnant.  Before the test, ask your doctor about changing or stopping your normal medicines. This is important.  Ask your doctor whether you can return to your normal activities. You may be asked to drink more fluids. This information is not intended to replace advice given to you by your health care provider. Make sure you discuss any questions you have with your health care provider. Document Released: 03/27/2018 Document Revised: 03/27/2018 Document Reviewed: 03/27/2018 Elsevier Interactive Patient Education  2019 [employer].

## 2019-03-09 ENCOUNTER — Telehealth: Payer: Self-pay | Admitting: Cardiology

## 2019-03-09 ENCOUNTER — Telehealth (HOSPITAL_COMMUNITY): Payer: Self-pay | Admitting: *Deleted

## 2019-03-09 NOTE — Telephone Encounter (Signed)
Telephone call to patient. Informed him it was Ok to take verapamil prior to his nuclear scan myoview. Pt verbalized understanding.

## 2019-03-09 NOTE — Telephone Encounter (Signed)
Patient given detailed instructions per Myocardial Perfusion Study Information Sheet for the test on 03/13/19 at 7:45. Patient notified to arrive 15 minutes early and that it is imperative to arrive on time for appointment to keep from having the test rescheduled.  If you need to cancel or reschedule your appointment, please call the office within 24 hours of your appointment. . Patient verbalized understanding.Daneil Dolin

## 2019-03-09 NOTE — Telephone Encounter (Signed)
Patient called and is having a test on Tuesday morning. He is taking Verapamil every day so should he continue to take it or stop prior to his test? Please advise.

## 2019-03-12 ENCOUNTER — Telehealth: Payer: Self-pay | Admitting: Cardiology

## 2019-03-12 NOTE — Telephone Encounter (Signed)
patient had an episode yesterday while doing strenuous yard work which consisted of of chest tightness.  He also was awaken at 4am sweating, heart racing, and felt nauseated.  Patient is scheduled for a Lexi 03/13/2019 but is concerned about the episodes he is having.  Please call patient to discuss

## 2019-03-12 NOTE — Telephone Encounter (Signed)
Dr. Dulce Sellar reviewed patient's chart and advised that patient should keep his scheduled lexiscan appointment tomorrow and his follow up appointment has been moved to Thursday, 03/15/2019, at 4:20 pm in the [facility] office. Patient denies any chest pain at this time. Patient verbalized understanding and is agreeable to plan. No further questions.

## 2019-03-13 ENCOUNTER — Other Ambulatory Visit: Payer: Self-pay

## 2019-03-13 ENCOUNTER — Ambulatory Visit (HOSPITAL_COMMUNITY): Payer: Federal, State, Local not specified - PPO | Attending: Cardiology

## 2019-03-13 VITALS — Ht 68.0 in | Wt 212.0 lb

## 2019-03-13 DIAGNOSIS — R0789 Other chest pain: Secondary | ICD-10-CM

## 2019-03-13 DIAGNOSIS — I422 Other hypertrophic cardiomyopathy: Secondary | ICD-10-CM | POA: Diagnosis not present

## 2019-03-13 DIAGNOSIS — R079 Chest pain, unspecified: Secondary | ICD-10-CM

## 2019-03-13 LAB — MYOCARDIAL PERFUSION IMAGING
LV dias vol: 69 mL (ref 62–150)
LV sys vol: 23 mL
Peak HR: 121 {beats}/min
Rest HR: 96 {beats}/min
SDS: 7
SRS: 0
SSS: 7
TID: 0.86

## 2019-03-13 MED ORDER — TECHNETIUM TC 99M TETROFOSMIN IV KIT
30.1000 | PACK | Freq: Once | INTRAVENOUS | Status: AC | PRN
  Administered 2019-03-13: 30.1 via INTRAVENOUS
  Filled 2019-03-13: qty 31

## 2019-03-13 MED ORDER — REGADENOSON 0.4 MG/5ML IV SOLN
0.4000 mg | Freq: Once | INTRAVENOUS | Status: AC
  Administered 2019-03-13: 0.4 mg via INTRAVENOUS

## 2019-03-13 MED ORDER — TECHNETIUM TC 99M TETROFOSMIN IV KIT
10.7000 | PACK | Freq: Once | INTRAVENOUS | Status: AC | PRN
  Administered 2019-03-13: 10.7 via INTRAVENOUS
  Filled 2019-03-13: qty 11

## 2019-03-14 NOTE — Progress Notes (Deleted)
Cardiology Office Note:    Date:  03/14/2019   ID:  Edward Curtis, DOB [birth year], MRN [id number]  PCP:  [name], MD  Cardiologist:  Norman Herrlich, MD    Referring MD: [name], MD    ASSESSMENT:    No diagnosis found. PLAN:    In order of problems listed above:  1. ***   Next appointment: ***   Medication Adjustments/Labs and Tests Ordered: Current medicines are reviewed at length with the patient today.  Concerns regarding medicines are outlined above.  No orders of the defined types were placed in this encounter.  No orders of the defined types were placed in this encounter.   No chief complaint on file.   History of Present Illness:    Edward Curtis is a 63 y.o. male with a hx of hypertrophic cardiomyopathy and supraventricular tachycardia last echocardiogram January 2018 showed mild to moderate concentric left ventricular hypertrophy and no evidence of left ventricular outflow tract obstruction.  He was last seen 03/08/2019 by Dr. Tomie China ordered and myocardial perfusion study.  It was done pharmacologically function was normal EF 67% there were no EKG ST segment abnormality chest pain or perfusion abnormality.  The test was normal. Compliance with diet, lifestyle and medications: *** Past Medical History:  Diagnosis Date  . Allergy    seasonal allergies   . Arthritis   . Asthma   . Blood transfusion without reported diagnosis 1985  . Calcium oxalate renal stones    early 1990's  . Chicken pox   . Chronic neck pain   . Depression   . GERD (gastroesophageal reflux disease)   . Hyperlipidemia 10/10/2013  . Hypertrophic obstructive cardiomyopathy(425.11) 10/10/2013  . IBS (irritable bowel syndrome) 12/07/2011  . Migraines   . Neuromuscular disorder (HCC)    radiculopathy left side   . Obesity (BMI 30-39.9) 10/10/2013  . Seasonal allergies     Past Surgical History:  Procedure Laterality Date  . left knee surgery  1990  . MOUTH SURGERY   1975/1993  . pre cancerous mole  2013  . prostate procedure  1980  . umbilical cyst  1985    Current Medications: No outpatient medications have been marked as taking for the 03/15/19 encounter (Appointment) with Baldo Daub, MD.     Allergies:   Ciprofloxacin; Penicillins; Iodinated diagnostic agents; Erythromycin; Ibuprofen; Latex; Other; and Sulfa antibiotics   Social History   Socioeconomic History  . Marital status: Single    Spouse name: Not on file  . Number of children: Not on file  . Years of education: Not on file  . Highest education level: Not on file  Occupational History  . Not on file  Social Needs  . Financial resource strain: Not on file  . Food insecurity:    Worry: Not on file    Inability: Not on file  . Transportation needs:    Medical: Not on file    Non-medical: Not on file  Tobacco Use  . Smoking status: Never Smoker  . Smokeless tobacco: Never Used  Substance and Sexual Activity  . Alcohol use: Yes    Alcohol/week: 1.0 standard drinks    Types: 1 Glasses of wine per week    Comment: once a month  . Drug use: No  . Sexual activity: Not Currently  Lifestyle  . Physical activity:    Days per week: Not on file    Minutes per session: Not on file  . Stress: Not on  file  Relationships  . Social connections:    Talks on phone: Not on file    Gets together: Not on file    Attends religious service: Not on file    Active member of club or organization: Not on file    Attends meetings of clubs or organizations: Not on file    Relationship status: Not on file  Other Topics Concern  . Not on file  Social History Narrative  . Not on file     Family History: The patient's ***family history includes Arthritis in his mother and paternal grandmother; Colon cancer in his paternal grandfather; Diabetes in his father, paternal grandfather, and paternal grandmother; Heart disease in his father, mother, and paternal grandfather; Hyperlipidemia in his  maternal grandmother and mother; Hypertension in his father, mother, and another family member. There is no history of Esophageal cancer, Rectal cancer, or Stomach cancer. ROS:   Please see the history of present illness.    All other systems reviewed and are negative.  EKGs/Labs/Other Studies Reviewed:    The following studies were reviewed today:  EKG:  EKG ordered today and personally reviewed.  The ekg ordered today demonstrates ***  Recent Labs: 08/31/2018: ALT 15; BUN 11; Creatinine, Ser 0.94; Hemoglobin 15.4; NT-Pro BNP 43; Platelets 225; Potassium 4.0; Sodium 143  Recent Lipid Panel    Component Value Date/Time   CHOL 262 (H) 09/04/2014 0926   TRIG 317.0 (H) 09/04/2014 0926   HDL 27.60 (L) 09/04/2014 0926   CHOLHDL 9 09/04/2014 0926   VLDL 63.4 (H) 09/04/2014 0926   LDLCALC 126 (H) 10/01/2013 1407   LDLDIRECT 151.2 09/04/2014 0926    Physical Exam:    VS:  There were no vitals taken for this visit.    Wt Readings from Last 3 Encounters:  03/13/19 212 lb (96.2 kg)  03/08/19 212 lb (96.2 kg)  01/04/19 215 lb (97.5 kg)     GEN: *** Well nourished, well developed in no acute distress HEENT: Normal NECK: No JVD; No carotid bruits LYMPHATICS: No lymphadenopathy CARDIAC: ***RRR, no murmurs, rubs, gallops RESPIRATORY:  Clear to auscultation without rales, wheezing or rhonchi  ABDOMEN: Soft, non-tender, non-distended MUSCULOSKELETAL:  No edema; No deformity  SKIN: Warm and dry NEUROLOGIC:  Alert and oriented x 3 PSYCHIATRIC:  Normal affect    Signed, Norman HerrlichBrian Munley, MD  03/14/2019 4:15 PM    [facility]

## 2019-03-15 ENCOUNTER — Telehealth: Payer: Federal, State, Local not specified - PPO | Admitting: Cardiology

## 2019-03-15 ENCOUNTER — Telehealth: Payer: Self-pay | Admitting: Cardiology

## 2019-03-15 ENCOUNTER — Telehealth: Payer: Self-pay

## 2019-03-15 NOTE — Telephone Encounter (Signed)
Virtual Visit Pre-Appointment Phone Call  "(Name), I am calling you today to discuss your upcoming appointment. We are currently trying to limit exposure to the virus that causes COVID-19 by seeing patients at home rather than in the office."  1. "What is the BEST phone number to call the day of the visit?" - include this in appointment notes  2. Do you have or have access to (through a family member/friend) a smartphone with video capability that we can use for your visit?" a. If yes - list this number in appt notes as cell (if different from BEST phone #) and list the appointment type as a VIDEO visit in appointment notes b. If no - list the appointment type as a PHONE visit in appointment notes  3. Confirm consent - "In the setting of the current Covid19 crisis, you are scheduled for a (phone or video) visit with your provider on (date) at (time).  Just as we do with many in-office visits, in order for you to participate in this visit, we must obtain consent.  If you'd like, I can send this to your mychart (if signed up) or email for you to review.  Otherwise, I can obtain your verbal consent now.  All virtual visits are billed to your insurance company just like a normal visit would be.  By agreeing to a virtual visit, we'd like you to understand that the technology does not allow for your provider to perform an examination, and thus may limit your provider's ability to fully assess your condition. If your provider identifies any concerns that need to be evaluated in person, we will make arrangements to do so.  Finally, though the technology is pretty good, we cannot assure that it will always work on either your or our end, and in the setting of a video visit, we may have to convert it to a phone-only visit.  In either situation, we cannot ensure that we have a secure connection.  Are you willing to proceed?" STAFF: Did the patient verbally acknowledge consent to telehealth visit? Document  YES/NO here: Yes  4. Advise patient to be prepared - "Two hours prior to your appointment, go ahead and check your blood pressure, pulse, oxygen saturation, and your weight (if you have the equipment to check those) and write them all down. When your visit starts, your provider will ask you for this information. If you have an Apple Watch or Kardia device, please plan to have heart rate information ready on the day of your appointment. Please have a pen and paper handy nearby the day of the visit as well."  5. Give patient instructions for MyChart download to smartphone OR Doximity/Doxy.me as below if video visit (depending on what platform provider is using)  6. Inform patient they will receive a phone call 15 minutes prior to their appointment time (may be from unknown caller ID) so they should be prepared to answer    TELEPHONE CALL NOTE  Shin Hudzinski has been deemed a candidate for a follow-up tele-health visit to limit community exposure during the Covid-19 pandemic. I spoke with the patient via phone to ensure availability of phone/video source, confirm preferred email & phone number, and discuss instructions and expectations.  I reminded [name] to be prepared with any vital sign and/or heart rhythm information that could potentially be obtained via home monitoring, at the time of his visit. I reminded Lathen Cucco to expect a phone call prior to his visit.  Alvy Bealaula A Perzee 03/15/2019 8:20 AM   INSTRUCTIONS FOR DOWNLOADING THE MYCHART APP TO SMARTPHONE  - The patient must first make sure to have activated MyChart and know their login information - If Apple, go to [employer] Store and type in MyChart in the search bar and download the app. If Android, ask patient to go to [employer] Play Store and type in [facility] in the search bar and download the app. The app is free but as with any other app downloads, their phone may require them to verify saved payment information or Apple/Android password.    - The patient will need to then log into the app with their MyChart username and password, and select [facility] as their healthcare provider to link the account. When it is time for your visit, go to the MyChart app, find appointments, and click Begin Video Visit. Be sure to Select Allow for your device to access the Microphone and Camera for your visit. You will then be connected, and your provider will be with you shortly.  **If they have any issues connecting, or need assistance please contact MyChart service desk [phone])**  **If using a computer, in order to ensure the best quality for their visit they will need to use either of the following Internet Browsers: [employer] Edge, or Google Chrome**  IF USING DOXIMITY or DOXY.ME - The patient will receive a link just prior to their visit by text.     FULL LENGTH CONSENT FOR TELE-HEALTH VISIT   I hereby voluntarily request, consent and authorize [facility] and its employed or contracted physicians, physician assistants, nurse practitioners or other licensed health care professionals (the Practitioner), to provide me with telemedicine health care services (the Services") as deemed necessary by the treating Practitioner. I acknowledge and consent to receive the Services by the Practitioner via telemedicine. I understand that the telemedicine visit will involve communicating with the Practitioner through live audiovisual communication technology and the disclosure of certain medical information by electronic transmission. I acknowledge that I have been given the opportunity to request an in-person assessment or other available alternative prior to the telemedicine visit and am voluntarily participating in the telemedicine visit.  I understand that I have the right to withhold or withdraw my consent to the use of telemedicine in the course of my care at any time, without affecting my right to future care or treatment, and that  the Practitioner or I may terminate the telemedicine visit at any time. I understand that I have the right to inspect all information obtained and/or recorded in the course of the telemedicine visit and may receive copies of available information for a reasonable fee.  I understand that some of the potential risks of receiving the Services via telemedicine include:   Delay or interruption in medical evaluation due to technological equipment failure or disruption;  Information transmitted may not be sufficient (e.g. poor resolution of images) to allow for appropriate medical decision making by the Practitioner; and/or   In rare instances, security protocols could fail, causing a breach of personal health information.  Furthermore, I acknowledge that it is my responsibility to provide information about my medical history, conditions and care that is complete and accurate to the best of my ability. I acknowledge that Practitioner's advice, recommendations, and/or decision may be based on factors not within their control, such as incomplete or inaccurate data provided by me or distortions of diagnostic images or specimens that may result from electronic transmissions. I understand that  the practice of medicine is not an exact science and that Practitioner makes no warranties or guarantees regarding treatment outcomes. I acknowledge that I will receive a copy of this consent concurrently upon execution via email to the email address I last provided but may also request a printed copy by calling the office of [facility].    I understand that my insurance will be billed for this visit.   I have read or had this consent read to me.  I understand the contents of this consent, which adequately explains the benefits and risks of the Services being provided via telemedicine.   I have been provided ample opportunity to ask questions regarding this consent and the Services and have had my questions answered to  my satisfaction.  I give my informed consent for the services to be provided through the use of telemedicine in my medical care  By participating in this telemedicine visit I agree to the above.

## 2019-03-15 NOTE — Telephone Encounter (Signed)
-----   Message from Garwin Brothers, MD sent at 03/13/2019  5:02 PM EDT ----- The results of the study is unremarkable. Please inform patient. I will discuss in detail at next appointment. Cc  primary care/referring physician Garwin Brothers, MD 03/13/2019 5:02 PM

## 2019-03-15 NOTE — Telephone Encounter (Signed)
Left message for patient to call back for results, copy sent to Dr.Fry per Dr.RRR request.

## 2019-03-16 ENCOUNTER — Ambulatory Visit (INDEPENDENT_AMBULATORY_CARE_PROVIDER_SITE_OTHER): Payer: Federal, State, Local not specified - PPO | Admitting: Cardiology

## 2019-03-16 ENCOUNTER — Other Ambulatory Visit: Payer: Self-pay

## 2019-03-16 ENCOUNTER — Encounter: Payer: Self-pay | Admitting: Cardiology

## 2019-03-16 VITALS — BP 128/72 | HR 105 | Temp 99.1°F | Ht 68.0 in | Wt 216.0 lb

## 2019-03-16 DIAGNOSIS — T50995S Adverse effect of other drugs, medicaments and biological substances, sequela: Secondary | ICD-10-CM

## 2019-03-16 DIAGNOSIS — I209 Angina pectoris, unspecified: Secondary | ICD-10-CM | POA: Diagnosis not present

## 2019-03-16 DIAGNOSIS — E782 Mixed hyperlipidemia: Secondary | ICD-10-CM

## 2019-03-16 DIAGNOSIS — I422 Other hypertrophic cardiomyopathy: Secondary | ICD-10-CM | POA: Diagnosis not present

## 2019-03-16 DIAGNOSIS — Z01812 Encounter for preprocedural laboratory examination: Secondary | ICD-10-CM

## 2019-03-16 DIAGNOSIS — T50995A Adverse effect of other drugs, medicaments and biological substances, initial encounter: Secondary | ICD-10-CM | POA: Insufficient documentation

## 2019-03-16 MED ORDER — PREDNISONE 50 MG PO TABS: ORAL_TABLET | ORAL | 0 refills | Status: DC

## 2019-03-16 NOTE — H&P (View-Only) (Signed)
Cardiology Office Note:    Date:  03/16/2019   ID:  Edward Curtis, DOB 25-Aug-1956, MRN [id number]  PCP:  [name], MD  Cardiologist:  Norman Herrlich, MD    Referring MD: [name], MD    ASSESSMENT:    1. Angina pectoris (HCC)   2. Allergic reaction to dye, sequela   3. Hypertrophic cardiomyopathy (HCC)   4. Mixed hyperlipidemia    PLAN:    In order of problems listed above:  1. He is having typical angina in the limiting pattern [place] classification 3 with normal myocardial perfusion study after review of benefits options and risk we will undergo coronary angiography I consider this urgent. 2. He will receive a steroid and Benadryl prep 3. Continue his calcium channel blocker if he has normal coronary angiography may require further evaluation for suspected nonobstructive hypertrophic cardiomyopathy under the care of Dr. Donnie Aho previously 4. Presently not on a statin, if he has obstructive CAD he will require high intensity statin and a goal LDL of less than 70   Next appointment: 2 weeks   Medication Adjustments/Labs and Tests Ordered: Current medicines are reviewed at length with the patient today.  Concerns regarding medicines are outlined above.  No orders of the defined types were placed in this encounter.  No orders of the defined types were placed in this encounter.   Chief Complaint  Patient presents with  . Follow-up  . Chest Pain  . Tachycardia    History of Present Illness:    Edward Curtis is a 63 y.o. male with a hx of hypertrophic cardiomyopathy SVT hyperlipidemia and typical limiting angina pectoris with a normal myoview last seen 03/06/19.   He has been experiencing typical exertional angina several times a week with minimal activities especially after a meal.  This sensation is substernal pressure severe no radiation mild shortness of breath no diaphoresis nausea or vomiting the episodes last up to 15 minutes he takes a clenched fist and  holds it to his breast to describe the sensation and affect today while getting ready to come to my office he had a severe episode of chest pain that he had to stop pause and rest.  He had a normal myocardial perfusion study pharmacologically in my concern he has multivessel CAD and balanced ischemia.  Reviewed options for further care including intensified medical treatment cardiac CTA referral for coronary angiography after discussion of benefits risk and options especially in the COVID-19 era elected to undergo coronary angiography.  I feel this procedure is urgent and needs to be performed at the patient will be scheduled on Wednesday next week.  He has allergy to IVP dye he will be prepped no contraindication to dual antiplatelet therapy normal renal function.  He takes Benadryl daily and has resting sinus tachycardia I asked him to stop his Benadryl continue his verapamil.  He will be given a prescription for nitroglycerin. Compliance with diet, lifestyle and medications: Yes Past Medical History:  Diagnosis Date  . Allergy    seasonal allergies   . Arthritis   . Asthma   . Blood transfusion without reported diagnosis 1985  . Calcium oxalate renal stones    early 1990's  . Chicken pox   . Chronic neck pain   . Depression   . GERD (gastroesophageal reflux disease)   . Hyperlipidemia 10/10/2013  . Hypertrophic obstructive cardiomyopathy(425.11) 10/10/2013  . IBS (irritable bowel syndrome) 12/07/2011  . Migraines   . Neuromuscular disorder (HCC)  radiculopathy left side   . Obesity (BMI 30-39.9) 10/10/2013  . Seasonal allergies     Past Surgical History:  Procedure Laterality Date  . left knee surgery  1990  . MOUTH SURGERY  1975/1993  . pre cancerous mole  2013  . prostate procedure  1980  . umbilical cyst  1985    Current Medications: Current Meds  Medication Sig  . acetaminophen (TYLENOL) 500 MG tablet Take 1,000 mg by mouth daily as needed for moderate pain.   [name]. aspirin  325 MG tablet Take 81 mg by mouth daily as needed.  . diphenhydrAMINE (BENADRYL) 25 mg capsule Take 50 mg by mouth every 6 (six) hours as needed.   [name]. EPINEPHrine (EPIPEN 2-PAK) 0.3 mg/0.3 mL SOAJ injection Inject 0.3 mLs (0.3 mg total) into the muscle once as needed (for severe allergic reaction). CAll 911 immediately if you have to use this medicine  . loratadine (CLARITIN) 10 MG tablet Take 10 mg by mouth daily.  . nitroGLYCERIN (NITROSTAT) 0.4 MG SL tablet Place 1 tablet (0.4 mg total) under the tongue every 5 (five) minutes as needed for chest pain.  [name]. oxyCODONE-acetaminophen (PERCOCET/ROXICET) 5-325 MG tablet Take 1 tablet by mouth every 6 (six) hours as needed for severe pain.  . polyethylene glycol (MIRALAX / GLYCOLAX) 17 g packet Take 17 g by mouth daily as needed.  . Probiotic Product (PROBIOTIC PO) Take 1 tablet by mouth daily as needed.   . valACYclovir HCl (VALTREX PO) Take 1 tablet by mouth daily as needed.  . verapamil (CALAN) 80 MG tablet Take 1 tablet (80 mg total) by mouth daily as needed.  . verapamil (CALAN-SR) 180 MG CR tablet Take 1 tablet (180 mg total) by mouth daily.     Allergies:   Ciprofloxacin; Penicillins; Iodinated diagnostic agents; Erythromycin; Ibuprofen; Latex; Other; and Sulfa antibiotics   Social History   Socioeconomic History  . Marital status: Single    Spouse name: Not on file  . Number of children: Not on file  . Years of education: Not on file  . Highest education level: Not on file  Occupational History  . Not on file  Social Needs  . Financial resource strain: Not on file  . Food insecurity:    Worry: Not on file    Inability: Not on file  . Transportation needs:    Medical: Not on file    Non-medical: Not on file  Tobacco Use  . Smoking status: Never Smoker  . Smokeless tobacco: Never Used  Substance and Sexual Activity  . Alcohol use: Yes    Alcohol/week: 1.0 standard drinks    Types: 1 Glasses of wine per week    Comment: once a  month  . Drug use: No  . Sexual activity: Not Currently  Lifestyle  . Physical activity:    Days per week: Not on file    Minutes per session: Not on file  . Stress: Not on file  Relationships  . Social connections:    Talks on phone: Not on file    Gets together: Not on file    Attends religious service: Not on file    Active member of club or organization: Not on file    Attends meetings of clubs or organizations: Not on file    Relationship status: Not on file  Other Topics Concern  . Not on file  Social History Narrative  . Not on file     Family History: The patient's family history  includes Arthritis in his mother and paternal grandmother; Colon cancer in his paternal grandfather; Diabetes in his father, paternal grandfather, and paternal grandmother; Heart disease in his father, mother, and paternal grandfather; Hyperlipidemia in his maternal grandmother and mother; Hypertension in his father, mother, and another family member. There is no history of Esophageal cancer, Rectal cancer, or Stomach cancer. ROS:   Please see the history of present illness.    All other systems reviewed and are negative.  EKGs/Labs/Other Studies Reviewed:    The following studies were reviewed today:  EKG:  EKG ordered today and personally reviewed.  The ekg ordered today demonstrates SRTH RBBB  Recent Labs: 08/31/2018: ALT 15; BUN 11; Creatinine, Ser 0.94; Hemoglobin 15.4; NT-Pro BNP 43; Platelets 225; Potassium 4.0; Sodium 143  Recent Lipid Panel    Component Value Date/Time   CHOL 262 (H) 09/04/2014 0926   TRIG 317.0 (H) 09/04/2014 0926   HDL 27.60 (L) 09/04/2014 0926   CHOLHDL 9 09/04/2014 0926   VLDL 63.4 (H) 09/04/2014 0926   LDLCALC 126 (H) 10/01/2013 1407   LDLDIRECT 151.2 09/04/2014 0926    Physical Exam:    VS:  BP 128/72   Pulse (!) 105   Temp 99.1 F (37.3 C)   Ht  (1.727 m)   Wt 216 lb (98 kg)   BMI 32.84 kg/m     Wt Readings from Last 3 Encounters:   03/16/19 216 lb (98 kg)  03/13/19 212 lb (96.2 kg)  03/08/19 212 lb (96.2 kg)     GEN:  Well nourished, well developed in no acute distress HEENT: Normal NECK: No JVD; No carotid bruits LYMPHATICS: No lymphadenopathy CARDIAC: RRR, no murmurs, rubs, gallops RESPIRATORY:  Clear to auscultation without rales, wheezing or rhonchi  ABDOMEN: Soft, non-tender, non-distended MUSCULOSKELETAL:  No edema; No deformity  SKIN: Warm and dry NEUROLOGIC:  Alert and oriented x 3 PSYCHIATRIC:  Normal affect    Signed, Norman Herrlich, MD  03/16/2019 2:28 PM    [facility]

## 2019-03-16 NOTE — Patient Instructions (Signed)
Medication Instructions:  Your physician recommends that you continue on your current medications as directed. Please refer to the Current Medication list given to you today.  If you need a refill on your cardiac medications before your next appointment, please call your pharmacy.   Lab work: Your physician recommends that you return for lab work in: TODAY BMP,CBC,INR,TSH,T4,T3  If you have labs (blood work) drawn today and your tests are completely normal, you will receive your results only by: [name] MyChart Message (if you have MyChart) OR . A paper copy in the mail If you have any lab test that is abnormal or we need to change your treatment, we will call you to review the results.  Testing/Procedures:    [name] CARDIOVASCULAR DIVISION [facility] [place] [address] [name] Dept: [phone] Loc: [phone]  Edward Curtis  03/16/2019  You are scheduled for a Cardiac Catheterization on Friday, May 29 with Dr. [name].  1. Please arrive at the [facility] (Main Entrance A[facility] at [facility]: [address] [place], [place] [address] at 5:30 AM (This time is two hours before your procedure to ensure your preparation). Free valet parking service is available.   Special note: Every effort is made to have your procedure done on time. Please understand that emergencies sometimes delay scheduled procedures.  2. Diet: Do not eat solid foods after midnight.  The patient may have clear liquids until 5am upon the day of the procedure.  3. Labs: No needed.  4. Medication instructions in preparation for your procedure:   Contrast Allergy: Yes, Please take Prednisone 50mg  by mouth at: Thirteen hours prior to cath 4:00pm on Thursday Seven hours prior to cath 11:00pm on Thursday And prior to leaving home please take last dose of Prednisone 50mg  and Benadryl 50mg  by mouth.   On the morning of your procedure,  take your Aspirin and any morning medicines NOT listed above.  You may use sips of water.  5. Plan for one night stay--bring personal belongings. 6. Bring a current list of your medications and current insurance cards. 7. You MUST have a responsible person to drive you home. 8. Someone MUST be with you the first 24 hours after you arrive home or your discharge will be delayed. 9. Please wear clothes that are easy to get on and off and wear slip-on shoes.  Thank you for allowing [place] to care for you!   -- [name]  Follow-Up: At [facility], you and your health needs are our priority.  As part of our continuing mission to provide you with exceptional heart care, we have created designated Provider Care Teams.  These Care Teams include your primary Cardiologist (physician) and Advanced Practice Providers (APPs -  Physician Assistants and Nurse Practitioners) who all work together to provide you with the care you need, when you need it. You will need a follow up appointment in 2 weeks.  Please call our office 2 months in advance to schedule this appointment.  You may see Norman Herrlich, MD or another member of our [facility] Provider Team in [place]: Gypsy Balsam, MD . Belva Crome, MD  Any Other Special Instructions Will Be Listed Below (If Applicable).

## 2019-03-16 NOTE — Progress Notes (Signed)
Cardiology Office Note:    Date:  03/16/2019   ID:  Edward Curtis, DOB 25-Aug-1956, MRN [id number]  PCP:  [name], MD  Cardiologist:  Norman Herrlich, MD    Referring MD: [name], MD    ASSESSMENT:    1. Angina pectoris (HCC)   2. Allergic reaction to dye, sequela   3. Hypertrophic cardiomyopathy (HCC)   4. Mixed hyperlipidemia    PLAN:    In order of problems listed above:  1. He is having typical angina in the limiting pattern [place] classification 3 with normal myocardial perfusion study after review of benefits options and risk we will undergo coronary angiography I consider this urgent. 2. He will receive a steroid and Benadryl prep 3. Continue his calcium channel blocker if he has normal coronary angiography may require further evaluation for suspected nonobstructive hypertrophic cardiomyopathy under the care of Dr. Donnie Aho previously 4. Presently not on a statin, if he has obstructive CAD he will require high intensity statin and a goal LDL of less than 70   Next appointment: 2 weeks   Medication Adjustments/Labs and Tests Ordered: Current medicines are reviewed at length with the patient today.  Concerns regarding medicines are outlined above.  No orders of the defined types were placed in this encounter.  No orders of the defined types were placed in this encounter.   Chief Complaint  Patient presents with  . Follow-up  . Chest Pain  . Tachycardia    History of Present Illness:    Edward Curtis is a 63 y.o. male with a hx of hypertrophic cardiomyopathy SVT hyperlipidemia and typical limiting angina pectoris with a normal myoview last seen 03/06/19.   He has been experiencing typical exertional angina several times a week with minimal activities especially after a meal.  This sensation is substernal pressure severe no radiation mild shortness of breath no diaphoresis nausea or vomiting the episodes last up to 15 minutes he takes a clenched fist and  holds it to his breast to describe the sensation and affect today while getting ready to come to my office he had a severe episode of chest pain that he had to stop pause and rest.  He had a normal myocardial perfusion study pharmacologically in my concern he has multivessel CAD and balanced ischemia.  Reviewed options for further care including intensified medical treatment cardiac CTA referral for coronary angiography after discussion of benefits risk and options especially in the COVID-19 era elected to undergo coronary angiography.  I feel this procedure is urgent and needs to be performed at the patient will be scheduled on Wednesday next week.  He has allergy to IVP dye he will be prepped no contraindication to dual antiplatelet therapy normal renal function.  He takes Benadryl daily and has resting sinus tachycardia I asked him to stop his Benadryl continue his verapamil.  He will be given a prescription for nitroglycerin. Compliance with diet, lifestyle and medications: Yes Past Medical History:  Diagnosis Date  . Allergy    seasonal allergies   . Arthritis   . Asthma   . Blood transfusion without reported diagnosis 1985  . Calcium oxalate renal stones    early 1990's  . Chicken pox   . Chronic neck pain   . Depression   . GERD (gastroesophageal reflux disease)   . Hyperlipidemia 10/10/2013  . Hypertrophic obstructive cardiomyopathy(425.11) 10/10/2013  . IBS (irritable bowel syndrome) 12/07/2011  . Migraines   . Neuromuscular disorder (HCC)  radiculopathy left side   . Obesity (BMI 30-39.9) 10/10/2013  . Seasonal allergies     Past Surgical History:  Procedure Laterality Date  . left knee surgery  1990  . MOUTH SURGERY  1975/1993  . pre cancerous mole  2013  . prostate procedure  1980  . umbilical cyst  1985    Current Medications: Current Meds  Medication Sig  . acetaminophen (TYLENOL) 500 MG tablet Take 1,000 mg by mouth daily as needed for moderate pain.   [name]. aspirin  325 MG tablet Take 81 mg by mouth daily as needed.  . diphenhydrAMINE (BENADRYL) 25 mg capsule Take 50 mg by mouth every 6 (six) hours as needed.   [name]. EPINEPHrine (EPIPEN 2-PAK) 0.3 mg/0.3 mL SOAJ injection Inject 0.3 mLs (0.3 mg total) into the muscle once as needed (for severe allergic reaction). CAll 911 immediately if you have to use this medicine  . loratadine (CLARITIN) 10 MG tablet Take 10 mg by mouth daily.  . nitroGLYCERIN (NITROSTAT) 0.4 MG SL tablet Place 1 tablet (0.4 mg total) under the tongue every 5 (five) minutes as needed for chest pain.  [name]. oxyCODONE-acetaminophen (PERCOCET/ROXICET) 5-325 MG tablet Take 1 tablet by mouth every 6 (six) hours as needed for severe pain.  . polyethylene glycol (MIRALAX / GLYCOLAX) 17 g packet Take 17 g by mouth daily as needed.  . Probiotic Product (PROBIOTIC PO) Take 1 tablet by mouth daily as needed.   . valACYclovir HCl (VALTREX PO) Take 1 tablet by mouth daily as needed.  . verapamil (CALAN) 80 MG tablet Take 1 tablet (80 mg total) by mouth daily as needed.  . verapamil (CALAN-SR) 180 MG CR tablet Take 1 tablet (180 mg total) by mouth daily.     Allergies:   Ciprofloxacin; Penicillins; Iodinated diagnostic agents; Erythromycin; Ibuprofen; Latex; Other; and Sulfa antibiotics   Social History   Socioeconomic History  . Marital status: Single    Spouse name: Not on file  . Number of children: Not on file  . Years of education: Not on file  . Highest education level: Not on file  Occupational History  . Not on file  Social Needs  . Financial resource strain: Not on file  . Food insecurity:    Worry: Not on file    Inability: Not on file  . Transportation needs:    Medical: Not on file    Non-medical: Not on file  Tobacco Use  . Smoking status: Never Smoker  . Smokeless tobacco: Never Used  Substance and Sexual Activity  . Alcohol use: Yes    Alcohol/week: 1.0 standard drinks    Types: 1 Glasses of wine per week    Comment: once a  month  . Drug use: No  . Sexual activity: Not Currently  Lifestyle  . Physical activity:    Days per week: Not on file    Minutes per session: Not on file  . Stress: Not on file  Relationships  . Social connections:    Talks on phone: Not on file    Gets together: Not on file    Attends religious service: Not on file    Active member of club or organization: Not on file    Attends meetings of clubs or organizations: Not on file    Relationship status: Not on file  Other Topics Concern  . Not on file  Social History Narrative  . Not on file     Family History: The patient's family history  includes Arthritis in his mother and paternal grandmother; Colon cancer in his paternal grandfather; Diabetes in his father, paternal grandfather, and paternal grandmother; Heart disease in his father, mother, and paternal grandfather; Hyperlipidemia in his maternal grandmother and mother; Hypertension in his father, mother, and another family member. There is no history of Esophageal cancer, Rectal cancer, or Stomach cancer. ROS:   Please see the history of present illness.    All other systems reviewed and are negative.  EKGs/Labs/Other Studies Reviewed:    The following studies were reviewed today:  EKG:  EKG ordered today and personally reviewed.  The ekg ordered today demonstrates SRTH RBBB  Recent Labs: 08/31/2018: ALT 15; BUN 11; Creatinine, Ser 0.94; Hemoglobin 15.4; NT-Pro BNP 43; Platelets 225; Potassium 4.0; Sodium 143  Recent Lipid Panel    Component Value Date/Time   CHOL 262 (H) 09/04/2014 0926   TRIG 317.0 (H) 09/04/2014 0926   HDL 27.60 (L) 09/04/2014 0926   CHOLHDL 9 09/04/2014 0926   VLDL 63.4 (H) 09/04/2014 0926   LDLCALC 126 (H) 10/01/2013 1407   LDLDIRECT 151.2 09/04/2014 0926    Physical Exam:    VS:  BP 128/72   Pulse (!) 105   Temp 99.1 F (37.3 C)   Ht 5' 8" (1.727 m)   Wt 216 lb (98 kg)   BMI 32.84 kg/m     Wt Readings from Last 3 Encounters:   03/16/19 216 lb (98 kg)  03/13/19 212 lb (96.2 kg)  03/08/19 212 lb (96.2 kg)     GEN:  Well nourished, well developed in no acute distress HEENT: Normal NECK: No JVD; No carotid bruits LYMPHATICS: No lymphadenopathy CARDIAC: RRR, no murmurs, rubs, gallops RESPIRATORY:  Clear to auscultation without rales, wheezing or rhonchi  ABDOMEN: Soft, non-tender, non-distended MUSCULOSKELETAL:  No edema; No deformity  SKIN: Warm and dry NEUROLOGIC:  Alert and oriented x 3 PSYCHIATRIC:  Normal affect    Signed, Tally Mckinnon, MD  03/16/2019 2:28 PM    [facility]  

## 2019-03-17 LAB — BASIC METABOLIC PANEL
BUN/Creatinine Ratio: 11 (ref 10–24)
BUN: 10 mg/dL (ref 8–27)
CO2: 21 mmol/L (ref 20–29)
Calcium: 10 mg/dL (ref 8.6–10.2)
Chloride: 105 mmol/L (ref 96–106)
Creatinine, Ser: 0.94 mg/dL (ref 0.76–1.27)
GFR calc Af Amer: 99 mL/min/{1.73_m2} (ref >59)
GFR calc non Af Amer: 85 mL/min/{1.73_m2} (ref >59)
Glucose: 93 mg/dL (ref 65–99)
Potassium: 4.3 mmol/L (ref 3.5–5.2)
Sodium: 142 mmol/L (ref 134–144)

## 2019-03-17 LAB — CBC
Hematocrit: 45.7 % (ref 37.5–51.0)
Hemoglobin: 15.8 g/dL (ref 13.0–17.7)
MCH: 31.6 pg (ref 26.6–33.0)
MCHC: 34.6 g/dL (ref 31.5–35.7)
MCV: 91 fL (ref 79–97)
Platelets: 225 10*3/uL (ref 150–450)
RBC: 5 x10E6/uL (ref 4.14–5.80)
RDW: 13.3 % (ref 11.6–15.4)
WBC: 7 10*3/uL (ref 3.4–10.8)

## 2019-03-17 LAB — PROTIME-INR
INR: 1 (ref 0.8–1.2)
Prothrombin Time: 10.4 s (ref 9.1–12.0)

## 2019-03-17 LAB — TSH: TSH: 3.32 u[IU]/mL (ref 0.450–4.500)

## 2019-03-17 LAB — T3: T3, Total: 122 ng/dL (ref 71–180)

## 2019-03-17 LAB — T4: T4, Total: 7.5 ug/dL (ref 4.5–12.0)

## 2019-03-20 ENCOUNTER — Other Ambulatory Visit (HOSPITAL_COMMUNITY)
Admission: RE | Admit: 2019-03-20 | Discharge: 2019-03-20 | Disposition: A | Discharge status: Home / Self Care | Payer: Federal, State, Local not specified - PPO | Source: Ambulatory Visit | Attending: Cardiology | Admitting: Cardiology

## 2019-03-20 ENCOUNTER — Other Ambulatory Visit: Payer: Self-pay

## 2019-03-20 ENCOUNTER — Telehealth: Payer: Self-pay | Admitting: Cardiology

## 2019-03-20 DIAGNOSIS — Z01812 Encounter for preprocedural laboratory examination: Secondary | ICD-10-CM

## 2019-03-20 DIAGNOSIS — I209 Angina pectoris, unspecified: Secondary | ICD-10-CM

## 2019-03-20 DIAGNOSIS — E782 Mixed hyperlipidemia: Secondary | ICD-10-CM

## 2019-03-20 DIAGNOSIS — T50995S Adverse effect of other drugs, medicaments and biological substances, sequela: Secondary | ICD-10-CM

## 2019-03-20 DIAGNOSIS — I422 Other hypertrophic cardiomyopathy: Secondary | ICD-10-CM

## 2019-03-20 DIAGNOSIS — Z1159 Encounter for screening for other viral diseases: Secondary | ICD-10-CM | POA: Diagnosis not present

## 2019-03-20 NOTE — Telephone Encounter (Signed)
Called and informed patient allergy information is current and updated in chart reflecting anaphyllactic reaction to iodine. Informed that Dr. Dulce Sellar states steroid prep ordered is appropriate for his dye allergy.  Pt reports no further questions or concerns.

## 2019-03-20 NOTE — Telephone Encounter (Signed)
Patient called stating he has a heart cath scheduled for Friday and as he read over his paperwork for it -- he feels some information is not correctly stated and wanted to clarify. He says last time he was injected with iodine, the left side of his throat, neck and face became very swollen.  He was sent to ER to help gain control of the reaction.  His paper work only states he had hives with the reaction.  He is supposed to be taking prednisone to prepare for the heart cath but wants to be sure given the inaccurate info on his chart if he needs to do anything else. Please call patient to discuss [phone]

## 2019-03-20 NOTE — Telephone Encounter (Signed)
The steroid prep is appropriate for his dye allergy

## 2019-03-20 NOTE — Addendum Note (Signed)
Addended by: Crist Fat on: 03/20/2019 01:38 PM   Modules accepted: Orders

## 2019-03-21 ENCOUNTER — Other Ambulatory Visit (HOSPITAL_COMMUNITY): Payer: Federal, State, Local not specified - PPO

## 2019-03-21 LAB — NOVEL CORONAVIRUS, NAA (HOSP ORDER, SEND-OUT TO REF LAB; TAT 18-24 HRS): SARS-CoV-2, NAA: NOT DETECTED

## 2019-03-23 ENCOUNTER — Other Ambulatory Visit: Payer: Self-pay

## 2019-03-23 ENCOUNTER — Encounter (HOSPITAL_COMMUNITY)
Admission: RE | Disposition: A | Discharge status: Home / Self Care | Payer: Self-pay | Source: Home / Self Care | Attending: Cardiology

## 2019-03-23 ENCOUNTER — Ambulatory Visit (HOSPITAL_COMMUNITY)
Admission: RE | Admit: 2019-03-23 | Discharge: 2019-03-23 | Disposition: A | Discharge status: Home / Self Care | Payer: Federal, State, Local not specified - PPO | Attending: Cardiology | Admitting: Cardiology

## 2019-03-23 ENCOUNTER — Encounter (HOSPITAL_COMMUNITY): Payer: Self-pay | Admitting: Cardiology

## 2019-03-23 DIAGNOSIS — I422 Other hypertrophic cardiomyopathy: Secondary | ICD-10-CM | POA: Diagnosis not present

## 2019-03-23 DIAGNOSIS — Z6832 Body mass index (BMI) 32.0-32.9, adult: Secondary | ICD-10-CM | POA: Diagnosis not present

## 2019-03-23 DIAGNOSIS — I209 Angina pectoris, unspecified: Secondary | ICD-10-CM | POA: Diagnosis present

## 2019-03-23 DIAGNOSIS — Z886 Allergy status to analgesic agent status: Secondary | ICD-10-CM | POA: Diagnosis not present

## 2019-03-23 DIAGNOSIS — J45909 Unspecified asthma, uncomplicated: Secondary | ICD-10-CM | POA: Diagnosis not present

## 2019-03-23 DIAGNOSIS — Z880 Allergy status to penicillin: Secondary | ICD-10-CM | POA: Insufficient documentation

## 2019-03-23 DIAGNOSIS — Z01812 Encounter for preprocedural laboratory examination: Secondary | ICD-10-CM

## 2019-03-23 DIAGNOSIS — E669 Obesity, unspecified: Secondary | ICD-10-CM | POA: Diagnosis not present

## 2019-03-23 DIAGNOSIS — E785 Hyperlipidemia, unspecified: Secondary | ICD-10-CM | POA: Diagnosis present

## 2019-03-23 DIAGNOSIS — Z888 Allergy status to other drugs, medicaments and biological substances status: Secondary | ICD-10-CM | POA: Insufficient documentation

## 2019-03-23 DIAGNOSIS — R7303 Prediabetes: Secondary | ICD-10-CM | POA: Diagnosis not present

## 2019-03-23 DIAGNOSIS — Z7902 Long term (current) use of antithrombotics/antiplatelets: Secondary | ICD-10-CM | POA: Diagnosis not present

## 2019-03-23 DIAGNOSIS — K219 Gastro-esophageal reflux disease without esophagitis: Secondary | ICD-10-CM | POA: Insufficient documentation

## 2019-03-23 DIAGNOSIS — E782 Mixed hyperlipidemia: Secondary | ICD-10-CM

## 2019-03-23 DIAGNOSIS — Z7982 Long term (current) use of aspirin: Secondary | ICD-10-CM | POA: Insufficient documentation

## 2019-03-23 DIAGNOSIS — Z79899 Other long term (current) drug therapy: Secondary | ICD-10-CM | POA: Insufficient documentation

## 2019-03-23 DIAGNOSIS — Z881 Allergy status to other antibiotic agents status: Secondary | ICD-10-CM | POA: Insufficient documentation

## 2019-03-23 DIAGNOSIS — Z955 Presence of coronary angioplasty implant and graft: Secondary | ICD-10-CM | POA: Insufficient documentation

## 2019-03-23 DIAGNOSIS — I25119 Atherosclerotic heart disease of native coronary artery with unspecified angina pectoris: Secondary | ICD-10-CM | POA: Diagnosis not present

## 2019-03-23 DIAGNOSIS — T50995S Adverse effect of other drugs, medicaments and biological substances, sequela: Secondary | ICD-10-CM

## 2019-03-23 DIAGNOSIS — I471 Supraventricular tachycardia, unspecified: Secondary | ICD-10-CM | POA: Diagnosis present

## 2019-03-23 DIAGNOSIS — Z882 Allergy status to sulfonamides status: Secondary | ICD-10-CM | POA: Diagnosis not present

## 2019-03-23 DIAGNOSIS — F329 Major depressive disorder, single episode, unspecified: Secondary | ICD-10-CM | POA: Diagnosis not present

## 2019-03-23 HISTORY — PX: LEFT HEART CATH AND CORONARY ANGIOGRAPHY: CATH118249

## 2019-03-23 HISTORY — PX: CORONARY STENT INTERVENTION: CATH118234

## 2019-03-23 LAB — POCT ACTIVATED CLOTTING TIME
Activated Clotting Time: 257 seconds
Activated Clotting Time: 439 seconds

## 2019-03-23 SURGERY — LEFT HEART CATH AND CORONARY ANGIOGRAPHY
Anesthesia: LOCAL

## 2019-03-23 MED ORDER — CLOPIDOGREL BISULFATE 300 MG PO TABS
ORAL_TABLET | ORAL | Status: DC | PRN
  Administered 2019-03-23: 600 mg via ORAL

## 2019-03-23 MED ORDER — IOHEXOL 350 MG/ML SOLN
INTRAVENOUS | Status: DC | PRN
  Administered 2019-03-23: 145 mL via INTRAVENOUS

## 2019-03-23 MED ORDER — ONDANSETRON HCL 4 MG/2ML IJ SOLN: 4.0000 mg | Freq: Four times a day (QID) | INTRAMUSCULAR | Status: DC | PRN

## 2019-03-23 MED ORDER — MORPHINE SULFATE (PF) 2 MG/ML IV SOLN: 2.0000 mg | INTRAVENOUS | Status: DC | PRN

## 2019-03-23 MED ORDER — CLOPIDOGREL BISULFATE 75 MG PO TABS: 75.0000 mg | ORAL_TABLET | Freq: Every day | ORAL | 0 refills | Status: DC

## 2019-03-23 MED ORDER — VERAPAMIL HCL 2.5 MG/ML IV SOLN
INTRAVENOUS | Status: DC | PRN
  Administered 2019-03-23: 10 mL via INTRA_ARTERIAL

## 2019-03-23 MED ORDER — ATORVASTATIN CALCIUM 40 MG PO TABS
40.0000 mg | ORAL_TABLET | Freq: Every day | ORAL | Status: DC
  Administered 2019-03-23: 40 mg via ORAL
  Filled 2019-03-23 (×2): qty 1

## 2019-03-23 MED ORDER — NITROGLYCERIN 0.4 MG SL SUBL
SUBLINGUAL_TABLET | SUBLINGUAL | Status: DC | PRN
  Administered 2019-03-23: .4 mg via SUBLINGUAL

## 2019-03-23 MED ORDER — ACETAMINOPHEN 325 MG PO TABS: 650.0000 mg | ORAL_TABLET | ORAL | Status: DC | PRN

## 2019-03-23 MED ORDER — PREDNISONE 50 MG PO TABS
50.0000 mg | ORAL_TABLET | Freq: Once | ORAL | Status: AC
  Administered 2019-03-23: 13:00:00 50 mg via ORAL
  Filled 2019-03-23: qty 1
  Filled 2019-03-23: qty 2.5

## 2019-03-23 MED ORDER — CLOPIDOGREL BISULFATE 300 MG PO TABS
ORAL_TABLET | ORAL | Status: AC
  Filled 2019-03-23: qty 1

## 2019-03-23 MED ORDER — FAMOTIDINE IN NACL 20-0.9 MG/50ML-% IV SOLN
INTRAVENOUS | Status: AC
  Filled 2019-03-23: qty 50

## 2019-03-23 MED ORDER — HEPARIN SODIUM (PORCINE) 1000 UNIT/ML IJ SOLN
INTRAMUSCULAR | Status: AC
  Filled 2019-03-23: qty 1

## 2019-03-23 MED ORDER — OXYCODONE-ACETAMINOPHEN 5-325 MG PO TABS: 1.0000 | ORAL_TABLET | Freq: Every day | ORAL | Status: DC | PRN

## 2019-03-23 MED ORDER — SODIUM CHLORIDE 0.9 % IV SOLN: 250.0000 mL | INTRAVENOUS | Status: DC | PRN

## 2019-03-23 MED ORDER — LIDOCAINE HCL (PF) 1 % IJ SOLN
INTRAMUSCULAR | Status: DC | PRN
  Administered 2019-03-23: 4 mL

## 2019-03-23 MED ORDER — HEPARIN (PORCINE) IN NACL 1000-0.9 UT/500ML-% IV SOLN
INTRAVENOUS | Status: AC
  Filled 2019-03-23: qty 1000

## 2019-03-23 MED ORDER — SODIUM CHLORIDE 0.9% FLUSH: 3.0000 mL | INTRAVENOUS | Status: DC | PRN

## 2019-03-23 MED ORDER — MIDAZOLAM HCL 2 MG/2ML IJ SOLN
INTRAMUSCULAR | Status: AC
  Filled 2019-03-23: qty 2

## 2019-03-23 MED ORDER — CLOPIDOGREL BISULFATE 75 MG PO TABS: 75.0000 mg | ORAL_TABLET | Freq: Every day | ORAL | Status: DC

## 2019-03-23 MED ORDER — ATORVASTATIN CALCIUM 80 MG PO TABS: 80.0000 mg | ORAL_TABLET | Freq: Every day | ORAL | 6 refills | Status: DC

## 2019-03-23 MED ORDER — SODIUM CHLORIDE 0.9% FLUSH: 3.0000 mL | Freq: Two times a day (BID) | INTRAVENOUS | Status: DC

## 2019-03-23 MED ORDER — SODIUM CHLORIDE 0.9 % WEIGHT BASED INFUSION
1.0000 mL/kg/h | INTRAVENOUS | Status: DC
  Administered 2019-03-23: 999 mL/h via INTRAVENOUS

## 2019-03-23 MED ORDER — SODIUM CHLORIDE 0.9 % WEIGHT BASED INFUSION
3.0000 mL/kg/h | INTRAVENOUS | Status: AC
  Administered 2019-03-23: 3 mL/kg/h via INTRAVENOUS

## 2019-03-23 MED ORDER — FAMOTIDINE IN NACL 20-0.9 MG/50ML-% IV SOLN
INTRAVENOUS | Status: AC | PRN
  Administered 2019-03-23: 20 mg via INTRAVENOUS

## 2019-03-23 MED ORDER — VERAPAMIL HCL 2.5 MG/ML IV SOLN
INTRAVENOUS | Status: AC
  Filled 2019-03-23: qty 2

## 2019-03-23 MED ORDER — NITROGLYCERIN 0.4 MG SL SUBL
SUBLINGUAL_TABLET | SUBLINGUAL | Status: AC
  Filled 2019-03-23: qty 1

## 2019-03-23 MED ORDER — MIDAZOLAM HCL 2 MG/2ML IJ SOLN
INTRAMUSCULAR | Status: DC | PRN
  Administered 2019-03-23: 2 mg via INTRAVENOUS

## 2019-03-23 MED ORDER — FENTANYL CITRATE (PF) 100 MCG/2ML IJ SOLN
INTRAMUSCULAR | Status: DC | PRN
  Administered 2019-03-23 (×3): 25 ug via INTRAVENOUS

## 2019-03-23 MED ORDER — LABETALOL HCL 5 MG/ML IV SOLN: 10.0000 mg | INTRAVENOUS | Status: DC | PRN

## 2019-03-23 MED ORDER — NITROGLYCERIN 1 MG/10 ML FOR IR/CATH LAB
INTRA_ARTERIAL | Status: DC | PRN
  Administered 2019-03-23 (×2): 100 ug via INTRACORONARY

## 2019-03-23 MED ORDER — HEPARIN SODIUM (PORCINE) 1000 UNIT/ML IJ SOLN
INTRAMUSCULAR | Status: DC | PRN
  Administered 2019-03-23: 3000 [IU] via INTRAVENOUS
  Administered 2019-03-23 (×2): 5000 [IU] via INTRAVENOUS

## 2019-03-23 MED ORDER — FENTANYL CITRATE (PF) 100 MCG/2ML IJ SOLN
INTRAMUSCULAR | Status: AC
  Filled 2019-03-23: qty 2

## 2019-03-23 MED ORDER — NITROGLYCERIN 1 MG/10 ML FOR IR/CATH LAB
INTRA_ARTERIAL | Status: AC
  Filled 2019-03-23: qty 10

## 2019-03-23 MED ORDER — LIDOCAINE HCL (PF) 1 % IJ SOLN
INTRAMUSCULAR | Status: AC
  Filled 2019-03-23: qty 30

## 2019-03-23 MED ORDER — SODIUM CHLORIDE 0.9 % IV SOLN: INTRAVENOUS | Status: DC

## 2019-03-23 MED ORDER — DIPHENHYDRAMINE HCL 25 MG PO CAPS
25.0000 mg | ORAL_CAPSULE | Freq: Once | ORAL | Status: AC
  Administered 2019-03-23: 25 mg via ORAL
  Filled 2019-03-23: qty 1

## 2019-03-23 MED ORDER — ANGIOPLASTY BOOK
Status: AC
  Administered 2019-03-23: 10:00:00
  Filled 2019-03-23: qty 1

## 2019-03-23 MED ORDER — ASPIRIN 81 MG PO CHEW: 81.0000 mg | CHEWABLE_TABLET | ORAL | Status: DC

## 2019-03-23 MED ORDER — HEPARIN (PORCINE) IN NACL 1000-0.9 UT/500ML-% IV SOLN
INTRAVENOUS | Status: DC | PRN
  Administered 2019-03-23 (×2): 500 mL

## 2019-03-23 MED FILL — ATORVASTATIN CALCIUM 80 MG: 80 | 30 days supply | Qty: 30 | Fill #0

## 2019-03-23 MED FILL — CLOPIDOGREL 75 MG TABLET: 75 | 30 days supply | Qty: 30 | Fill #0

## 2019-03-23 SURGICAL SUPPLY — 17 items
BALLN SAPPHIRE 2.0X12 (BALLOONS) ×2
BALLN SAPPHIRE ~~LOC~~ 2.75X12 (BALLOONS) ×2 IMPLANT
BALLOON SAPPHIRE 2.0X12 (BALLOONS) ×1 IMPLANT
CATH OPTITORQUE TIG 4.0 5F (CATHETERS) ×2 IMPLANT
CATH VISTA GUIDE 6FR XBLAD3.5 (CATHETERS) ×2 IMPLANT
COVER DOME SNAP 22 D (MISCELLANEOUS) ×2 IMPLANT
DEVICE RAD COMP TR BAND LRG (VASCULAR PRODUCTS) ×2 IMPLANT
GLIDESHEATH SLEND SS 6F .021 (SHEATH) ×2 IMPLANT
GUIDEWIRE INQWIRE 1.5J.035X260 (WIRE) ×1 IMPLANT
INQWIRE 1.5J .035X260CM (WIRE) ×2
KIT ENCORE 26 ADVANTAGE (KITS) ×2 IMPLANT
KIT HEART LEFT (KITS) ×2 IMPLANT
PACK CARDIAC CATHETERIZATION (CUSTOM PROCEDURE TRAY) ×2 IMPLANT
STENT SYNERGY DES 2.5X20 (Permanent Stent) ×2 IMPLANT
TRANSDUCER W/STOPCOCK (MISCELLANEOUS) ×2 IMPLANT
TUBING CIL FLEX 10 FLL-RA (TUBING) ×2 IMPLANT
WIRE ASAHI PROWATER 180CM (WIRE) ×2 IMPLANT

## 2019-03-23 NOTE — Discharge Instructions (Signed)
Radial Site Care ° °This sheet gives you information about how to care for yourself after your procedure. Your health care provider may also give you more specific instructions. If you have problems or questions, contact your health care provider. °What can I expect after the procedure? °After the procedure, it is common to have: °· Bruising and tenderness at the catheter insertion area. °Follow these instructions at home: °Medicines °· Take over-the-counter and prescription medicines only as told by your health care provider. °Insertion site care °· Follow instructions from your health care provider about how to take care of your insertion site. Make sure you: °? Wash your hands with soap and water before you change your bandage (dressing). If soap and water are not available, use hand sanitizer. °? Change your dressing as told by your health care provider. °? Leave stitches (sutures), skin glue, or adhesive strips in place. These skin closures may need to stay in place for 2 weeks or longer. If adhesive strip edges start to loosen and curl up, you may trim the loose edges. Do not remove adhesive strips completely unless your health care provider tells you to do that. °· Check your insertion site every day for signs of infection. Check for: °? Redness, swelling, or pain. °? Fluid or blood. °? Pus or a bad smell. °? Warmth. °· Do not take baths, swim, or use a hot tub until your health care provider approves. °· You may shower 24-48 hours after the procedure, or as directed by your health care provider. °? Remove the dressing and gently wash the site with plain soap and water. °? Pat the area dry with a clean towel. °? Do not rub the site. That could cause bleeding. °· Do not apply powder or lotion to the site. °Activity ° °· For 24 hours after the procedure, or as directed by your health care provider: °? Do not flex or bend the affected arm. °? Do not push or pull heavy objects with the affected arm. °? Do not  drive yourself home from the hospital or clinic. You may drive 24 hours after the procedure unless your health care provider tells you not to. °? Do not operate machinery or power tools. °· Do not lift anything that is heavier than 10 lb (4.5 kg), or the limit that you are told, until your health care provider says that it is safe. °· Ask your health care provider when it is okay to: °? Return to work or school. °? Resume usual physical activities or sports. °? Resume sexual activity. °General instructions °· If the catheter site starts to bleed, raise your arm and put firm pressure on the site. If the bleeding does not stop, get help right away. This is a medical emergency. °· If you went home on the same day as your procedure, a responsible adult should be with you for the first 24 hours after you arrive home. °· Keep all follow-up visits as told by your health care provider. This is important. °Contact a health care provider if: °· You have a fever. °· You have redness, swelling, or yellow drainage around your insertion site. °Get help right away if: °· You have unusual pain at the radial site. °· The catheter insertion area swells very fast. °· The insertion area is bleeding, and the bleeding does not stop when you hold steady pressure on the area. °· Your arm or hand becomes pale, cool, tingly, or numb. °These symptoms may represent a serious problem   that is an emergency. Do not wait to see if the symptoms will go away. Get medical help right away. Call your local emergency services (911 in the [place] Do not drive yourself to the hospital. Summary  After the procedure, it is common to have bruising and tenderness at the site.  Follow instructions from your health care provider about how to take care of your radial site wound. Check the wound every day for signs of infection.  Do not lift anything that is heavier than 10 lb (4.5 kg), or the limit that you are told, until your health care provider says  that it is safe. This information is not intended to replace advice given to you by your health care provider. Make sure you discuss any questions you have with your health care provider. Document Released: 11/13/2010 Document Revised: 11/16/2017 Document Reviewed: 11/16/2017 Elsevier Interactive Patient Education  2019 [employer] Clopidogrel tablets What is this medicine? CLOPIDOGREL (kloh PID oh grel) helps to prevent blood clots. This medicine is used to prevent heart attack, stroke, or other vascular events in people who are at high risk. This medicine may be used for other purposes; ask your health care provider or pharmacist if you have questions. COMMON BRAND NAME(S): Plavix What should I tell my health care provider before I take this medicine? They need to know if you have any of the following conditions: -bleeding disorders -bleeding in the brain -having surgery -history of stomach bleeding -an unusual or allergic reaction to clopidogrel, other medicines, foods, dyes, or preservatives -pregnant or trying to get pregnant -breast-feeding How should I use this medicine? Take this medicine by mouth with a glass of water. Follow the directions on the prescription label. You may take this medicine with or without food. If it upsets your stomach, take it with food. Take your medicine at regular intervals. Do not take it more often than directed. Do not stop taking except on your doctor's advice. A special MedGuide will be given to you by the pharmacist with each prescription and refill. Be sure to read this information carefully each time. Talk to your pediatrician regarding the use of this medicine in children. Special care may be needed. Overdosage: If you think you have taken too much of this medicine contact a poison control center or emergency room at once. NOTE: This medicine is only for you. Do not share this medicine with others. What if I miss a dose? If you miss a dose, take  it as soon as you can. If it is almost time for your next dose, take only that dose. Do not take double or extra doses. What may interact with this medicine? Do not take this medicine with the following medications: -dasabuvir; ombitasvir; paritaprevir; ritonavir -defibrotide -selexipag This medicine may also interact with the following medications: -certain medicines that treat or prevent blood clots like warfarin -narcotic medicines for pain -NSAIDs, medicines for pain and inflammation, like ibuprofen or naproxen -repaglinide -SNRIs, medicines for depression, like desvenlafaxine, duloxetine, levomilnacipran, venlafaxine -SSRIs, medicines for depression, like citalopram, escitalopram, fluoxetine, fluvoxamine, paroxetine, sertraline -stomach acid blockers like cimetidine, esomeprazole, omeprazole This list may not describe all possible interactions. Give your health care provider a list of all the medicines, herbs, non-prescription drugs, or dietary supplements you use. Also tell them if you smoke, drink alcohol, or use illegal drugs. Some items may interact with your medicine. What should I watch for while using this medicine? Visit your doctor or health care professional for regular check-ups.  Do not stop taking your medicine unless your doctor tells you to. Notify your doctor or health care professional and seek emergency treatment if you develop breathing problems; changes in vision; chest pain; severe, sudden headache; pain, swelling, warmth in the leg; trouble speaking; sudden numbness or weakness of the face, arm or leg. These can be signs that your condition has gotten worse. If you are going to have surgery or dental work, tell your doctor or health care professional that you are taking this medicine. Certain genetic factors may reduce the effect of this medicine. Your doctor may use genetic tests to determine treatment. Only take aspirin if you are instructed to. Low doses of aspirin  are used with this medicine to treat some conditions. Taking aspirin with this medicine can increase your risk of bleeding so you must be careful. Talk to your doctor or pharmacist if you have questions. What side effects may I notice from receiving this medicine? Side effects that you should report to your doctor or health care professional as soon as possible: -allergic reactions like skin rash, itching or hives, swelling of the face, lips, or tongue -signs and symptoms of bleeding such as bloody or black, tarry stools; red or dark-brown urine; spitting up blood or brown material that looks like coffee grounds; red spots on the skin; unusual bruising or bleeding from the eye, gums, or nose -signs and symptoms of a blood clot such as breathing problems; changes in vision; chest pain; severe, sudden headache; pain, swelling, warmth in the leg; trouble speaking; sudden numbness or weakness of the face, arm or leg -signs and symptoms of low blood sugar such as feeling anxious; confusion; dizziness; increased hunger; unusually weak or tired; increased sweating; shakiness; cold, clammy skin; irritable; headache; blurred vision; fast heartbeat; loss of consciousness Side effects that usually do not require medical attention (report to your doctor or health care professional if they continue or are bothersome): -constipation -diarrhea -headache -upset stomach This list may not describe all possible side effects. Call your doctor for medical advice about side effects. You may report side effects to FDA at 1-800-FDA-1088. Where should I keep my medicine? Keep out of the reach of children. Store at room temperature of 59 to 86 degrees F (15 to 30 degrees C). Throw away any unused medicine after the expiration date. NOTE: This sheet is a summary. It may not cover all possible information. If you have questions about this medicine, talk to your doctor, pharmacist, or health care provider.  2019 Elsevier/Gold  Standard (2018-03-13 15:03:38) Atorvastatin tablets What is this medicine? ATORVASTATIN (a TORE va sta tin) is known as a HMG-CoA reductase inhibitor or 'statin'. It lowers the level of cholesterol and triglycerides in the blood. This drug may also reduce the risk of heart attack, stroke, or other health problems in patients with risk factors for heart disease. Diet and lifestyle changes are often used with this drug. This medicine may be used for other purposes; ask your health care provider or pharmacist if you have questions. COMMON BRAND NAME(S): Lipitor What should I tell my health care provider before I take this medicine? They need to know if you have any of these conditions: -diabetes -if you often drink alcohol -history of stroke -kidney disease -liver disease -muscle aches or weakness -thyroid disease -an unusual or allergic reaction to atorvastatin, other medicines, foods, dyes, or preservatives -pregnant or trying to get pregnant -breast-feeding How should I use this medicine? Take this medicine by mouth with  a glass of water. Follow the directions on the prescription label. You can take it with or without food. If it upsets your stomach, take it with food. Do not take with grapefruit juice. Take your medicine at regular intervals. Do not take it more often than directed. Do not stop taking except on your doctor's advice. Talk to your pediatrician regarding the use of this medicine in children. While this drug may be prescribed for children as young as 10 for selected conditions, precautions do apply. Overdosage: If you think you have taken too much of this medicine contact a poison control center or emergency room at once. NOTE: This medicine is only for you. Do not share this medicine with others. What if I miss a dose? If you miss a dose, take it as soon as you can. If your next dose is to be taken in less than 12 hours, then do not take the missed dose. Take the next dose at  your regular time. Do not take double or extra doses. What may interact with this medicine? Do not take this medicine with any of the following medications: -dasabuvir; ombitasvir; paritaprevir; ritonavir -ombitasvir; paritaprevir; ritonavir -posaconazole -red yeast rice This medicine may also interact with the following medications: -alcohol -birth control pills -certain antibiotics like erythromycin and clarithromycin -certain antivirals for HIV or hepatitis -certain medicines for cholesterol like fenofibrate, gemfibrozil, and niacin -certain medicines for fungal infections like ketoconazole and itraconazole -colchicine -cyclosporine -digoxin -grapefruit juice -rifampin This list may not describe all possible interactions. Give your health care provider a list of all the medicines, herbs, non-prescription drugs, or dietary supplements you use. Also tell them if you smoke, drink alcohol, or use illegal drugs. Some items may interact with your medicine. What should I watch for while using this medicine? Visit your doctor or health care professional for regular check-ups. You may need regular tests to make sure your liver is working properly. Your health care professional may tell you to stop taking this medicine if you develop muscle problems. If your muscle problems do not go away after stopping this medicine, contact your health care professional. Do not become pregnant while taking this medicine. Women should inform their health care professional if they wish to become pregnant or think they might be pregnant. There is a potential for serious side effects to an unborn child. Talk to your health care professional or pharmacist for more information. Do not breast-feed an infant while taking this medicine. This medicine may affect blood sugar levels. If you have diabetes, check with your doctor or health care professional before you change your diet or the dose of your diabetic medicine. If  you are going to need surgery or other procedure, tell your doctor that you are using this medicine. This drug is only part of a total heart-health program. Your doctor or a dietician can suggest a low-cholesterol and low-fat diet to help. Avoid alcohol and smoking, and keep a proper exercise schedule. This medicine may cause a decrease in Co-Enzyme Q-10. You should make sure that you get enough Co-Enzyme Q-10 while you are taking this medicine. Discuss the foods you eat and the vitamins you take with your health care professional. What side effects may I notice from receiving this medicine? Side effects that you should report to your doctor or health care professional as soon as possible: -allergic reactions like skin rash, itching or hives, swelling of the face, lips, or tongue -fever -joint pain -loss of memory -redness, blistering,  peeling or loosening of the skin, including inside the mouth -signs and symptoms of liver injury like dark yellow or brown urine; general ill feeling or flu-like symptoms; light-belly pain; unusually weak or tired; yellowing of the eyes or skin -signs and symptoms of muscle injury like dark urine; trouble passing urine or change in the amount of urine; unusually weak or tired; muscle pain or side or back pain Side effects that usually do not require medical attention (report to your doctor or health care professional if they continue or are bothersome): -diarrhea -nausea -stomach pain -trouble sleeping -upset stomach This list may not describe all possible side effects. Call your doctor for medical advice about side effects. You may report side effects to FDA at 1-800-FDA-1088. Where should I keep my medicine? Keep out of the reach of children. Store between 20 and 25 degrees C (68 and 77 degrees F). Throw away any unused medicine after the expiration date. NOTE: This sheet is a summary. It may not cover all possible information. If you have questions about this  medicine, talk to your doctor, pharmacist, or health care provider.  2019 Elsevier/Gold Standard (2018-02-10 11:36:48)

## 2019-03-23 NOTE — Interval H&P Note (Signed)
History and Physical Interval Note:  03/23/2019 7:06 AM  Edward Curtis  has presented today for surgery, with the diagnosis of angina, Class III.   The various methods of treatment have been discussed with the patient and family. After consideration of risks, benefits and other options for treatment, the patient has consented to  Procedure(s): LEFT HEART CATH AND CORONARY ANGIOGRAPHY (N/A) with possible PERCUTANEOUS CORONARY INTERVENTION as a surgical intervention.   The patient's history has been reviewed, patient examined, no change in status, stable for surgery.  I have reviewed the patient's chart and labs.  Questions were answered to the patient's satisfaction.    Cath Lab Visit (complete for each Cath Lab visit)  Clinical Evaluation Leading to the Procedure:   ACS: No.  Non-ACS:    Anginal Classification: CCS III  Anti-ischemic medical therapy: Minimal Therapy (1 class of medications)  Non-Invasive Test Results: Low-risk stress test findings: cardiac mortality <1%/year; Equivocal - has continued Sx.  Prior CABG: No previous CABG   [name]

## 2019-03-23 NOTE — Discharge Summary (Addendum)
Discharge Summary/SAME DAY PCI    Patient ID: Edward Curtis,  MRN: [id number], DOB/AGE: [birth year] 63 y.o.  Admit date: 03/23/2019 Discharge date: 03/23/2019  Primary Care Provider: Gershon CraneFry, Stephen A Primary Cardiologist: Dr. Dulce SellarMunley  Discharge Diagnoses    Principal Problem:   Angina, class III [facility](HCC) Active Problems:   HIstory of SVT   Hypertrophic cardiomyopathy (HCC)   Obesity (BMI 30-39.9)   Hyperlipidemia   Prediabetes   Allergies Allergies  Allergen Reactions   Ciprofloxacin Other (See Comments)    Possible "leg swelling, pain, unable to walk"   Iodinated Diagnostic Agents Anaphylaxis, Hives, Itching and Swelling    Patient must be premedicated with 13 hour protocol prior to IV contrast administration   Penicillins Hives    Did it involve swelling of the face/tongue/throat, SOB, or low BP? No Did it involve sudden or severe rash/hives, skin peeling, or any reaction on the inside of your mouth or nose? Yes Did you need to seek medical attention at a hospital or doctor's office? Yes When did it last happen?25 + years ago If all above answers are NO, may proceed with cephalosporin use.    Clindamycin/Lincomycin Diarrhea and Nausea Only   Erythromycin Diarrhea and Nausea Only   Ibuprofen Other (See Comments)    Causes tachycardia   Other Itching and Other (See Comments)     Walnut trees and walnuts , sores in mouth   Sulfa Antibiotics Hives   Latex Rash    Developed rash 06/2017 that progressed.  Allergy testing positive for latex allergy.  Later determined rash was 2/2 latex in [employer] foods.    Diagnostic Studies/Procedures    Cath: 03/23/19   CULPRIT LESION: Prox LAD lesion is 99% stenosed. -Subtotal CTO with TIMI I flow  A drug-eluting stent was successfully placed using a STENT SYNERGY DES 2.5X20. (Taper post dilation 2.9-2.7 mm)  Post intervention, there is a 0% residual stenosis.  The left ventricular systolic function is normal.  EF~> 65%. Unable to see apical wall.  LV end diastolic pressure is normal.   SUMMARY  Severe single-vessel CAD involving proximal to mid LAD 99% subtotal occlusion with TIMI I flow successfully treated with synergy DES 2.5 mm x  20 mm postdilated to 2.9-2.7 mm.  Otherwise angiographically normal coronary arteries  Normal/hyperdynamic LVEF with normal LVEDP   RECOMMENDATIONS  Anticipate same-day discharge after bed rest and TR band removal  Have added atorvastatin 40 mg daily, will defer further medication titration to primary cardiologist  Aspirin plus Plavix for minimum of 6 months, continue Plavix for minimum 1 year, preferably longer given LAD stent.    Bryan Lemmaavid Harding, M.D., M.S. _____________   History of Present Illness     Edward JeffersonRichard Rozar is a 63 y.o. male with a hx of hypertrophic cardiomyopathy SVT hyperlipidemia and typical limiting angina pectoris with a normal myoview last seen 03/16/19.   He had been experiencing typical exertional angina several times a week with minimal activities especially after a meal.  This sensation was substernal pressure severe no radiation mild shortness of breath no diaphoresis nausea or vomiting the episodes last up to 15 minutes. He would take a clenched fist and hold it to his breast to describe the sensation and while getting ready to come to the office he had a severe episode of chest pain that made him stop to rest.  He had a normal myocardial perfusion study pharmacologically, though concern was he may have multivessel CAD and balanced ischemia.  Reviewed  options for further care including intensified medical treatment cardiac CTA referral for coronary angiography after discussion of benefits risk and options especially in the COVID-19 era elected to undergo coronary angiography. He had an allergy to IVP dye he was prepped. No contraindication to dual antiplatelet therapy normal renal function.  He was be given a prescription for  nitroglycerin.  Hospital Course     Underwent cardiac cath noted above with severs single vessel CAD with PCI/DESx1 to the mLAD. Plan for DAPT with ASA/plavix for at least 6 months. Seen by cardiac rehab while in short stay. No complications post cath. Radial site stable . Instructions/precautions regarding cath site care given prior to discharge.   Edward Jefferson was seen by Dr. Herbie Baltimore and determined stable for discharge home. Follow up in the office has been arranged. Medications are listed below.   _____________  Discharge Vitals Blood pressure 107/61, pulse 77, temperature 98.5 F (36.9 C), temperature source Oral, resp. rate 18, height  (1.727 m), weight 97.1 kg, SpO2 96 %.  Filed Weights   03/23/19 0546  Weight: 97.1 kg    Labs & Radiologic Studies    CBC No results for input(s): WBC, NEUTROABS, HGB, HCT, MCV, PLT in the last 72 hours. Basic Metabolic Panel No results for input(s): NA, K, CL, CO2, GLUCOSE, BUN, CREATININE, CALCIUM, MG, PHOS in the last 72 hours. Liver Function Tests No results for input(s): AST, ALT, ALKPHOS, BILITOT, PROT, ALBUMIN in the last 72 hours. No results for input(s): LIPASE, AMYLASE in the last 72 hours. Cardiac Enzymes No results for input(s): CKTOTAL, CKMB, CKMBINDEX, TROPONINI in the last 72 hours. BNP Invalid input(s): POCBNP D-Dimer No results for input(s): DDIMER in the last 72 hours. Hemoglobin A1C No results for input(s): HGBA1C in the last 72 hours. Fasting Lipid Panel No results for input(s): CHOL, HDL, LDLCALC, TRIG, CHOLHDL, LDLDIRECT in the last 72 hours. Thyroid Function Tests No results for input(s): TSH, T4TOTAL, T3FREE, THYROIDAB in the last 72 hours.  Invalid input(s): FREET3 _____________  No results found. Disposition   Pt is being discharged home today in good condition.  Follow-up Plans & Appointments    Follow-up Information    Baldo Daub, MD Follow up on 03/30/2019.   Specialty:  Cardiology Why:  at  2:15pm for your follow up appt. This is a virtual visit to be done through your mobile phone.  Contact information: [address] [place] [address] [phone]          Discharge Instructions    Amb Referral to Cardiac Rehabilitation   Complete by:  As directed    Diagnosis:   Coronary Stents PTCA     After initial evaluation and assessments completed: Virtual Based Care may be provided alone or in conjunction with Phase 2 Cardiac Rehab based on patient barriers.:  Yes       Discharge Medications     Medication List    STOP taking these medications   predniSONE 50 MG tablet Commonly known as:  DELTASONE     TAKE these medications   acetaminophen 500 MG tablet Commonly known as:  TYLENOL Take 1,000 mg by mouth daily as needed for moderate pain.   aspirin EC 81 MG tablet Take 81 mg by mouth daily.   aspirin-sod bicarb-citric acid 325 MG Tbef tablet Commonly known as:  ALKA-SELTZER Take 650 mg by mouth daily as needed (indigestion).   atorvastatin 80 MG tablet Commonly known as:  Lipitor Take 1 tablet (80 mg total)  by mouth daily.   clopidogrel 75 MG tablet Commonly known as:  Plavix Take 1 tablet (75 mg total) by mouth daily.   diphenhydrAMINE 25 MG tablet Commonly known as:  BENADRYL Take 25 mg by mouth daily as needed (allergic reactions).   EPINEPHrine 0.3 mg/0.3 mL Soaj injection Commonly known as:  EpiPen 2-Pak Inject 0.3 mLs (0.3 mg total) into the muscle once as needed (for severe allergic reaction). CAll 911 immediately if you have to use this medicine   loratadine 10 MG tablet Commonly known as:  CLARITIN Take 10 mg by mouth daily as needed for allergies.   Melatonin 5 MG Caps Take 2.5 mg by mouth at bedtime.   nitroGLYCERIN 0.4 MG SL tablet Commonly known as:  NITROSTAT Place 1 tablet (0.4 mg total) under the tongue every 5 (five) minutes as needed for chest pain.   oxyCODONE-acetaminophen 5-325 MG tablet Commonly known as:   PERCOCET/ROXICET Take 1 tablet by mouth daily as needed for severe pain.   polyethylene glycol 17 g packet Commonly known as:  MIRALAX / GLYCOLAX Take 17 g by mouth daily as needed for moderate constipation.   valACYclovir 500 MG tablet Commonly known as:  VALTREX Take 500 mg by mouth daily as needed (cold sores). Take for 5 days at first sign of outbreak   verapamil 180 MG CR tablet Commonly known as:  CALAN-SR Take 1 tablet (180 mg total) by mouth daily. What changed:  Another medication with the same name was changed. Make sure you understand how and when to take each.   verapamil 80 MG tablet Commonly known as:  CALAN Take 1 tablet (80 mg total) by mouth daily as needed. What changed:  reasons to take this       Acute coronary syndrome (MI, NSTEMI, STEMI, etc) this admission?: No.     Outstanding Labs/Studies   FLP/LFTs in 6 weeks.   Duration of Discharge Encounter   Greater than 30 minutes including physician time.  Signed, Laverda Page NP-C 03/23/2019, 2:14 PM

## 2019-03-23 NOTE — Progress Notes (Signed)
Discussed with pt stent, Plavix, restrictions, diet, exercise guidelines, NTG, and CRPII. Will refer to [facility] CRPII. Good reception.  [id number] Ethelda Chick CES, ACSM 12:18 PM 03/23/2019

## 2019-03-26 ENCOUNTER — Telehealth: Payer: Self-pay | Admitting: Cardiology

## 2019-03-26 ENCOUNTER — Telehealth (HOSPITAL_COMMUNITY): Payer: Self-pay

## 2019-03-26 NOTE — Telephone Encounter (Signed)
Called patient to check to see which Dr he was going to see.  He normally sees Revankar but on the schedule for [facility] 06/5 and I need to see if he wants [facility] or Revnakar and If Revankar I need to book virtual visit with him this week. (per Revankar)

## 2019-03-26 NOTE — Telephone Encounter (Signed)
Patient called back stating he only wants to see [name], he says [name] is his cardiologist. He only saw [facility] once, when [name] was out of town on [place] and he was having issues.

## 2019-03-26 NOTE — Telephone Encounter (Signed)
Referral signed and received for this pt to participate in Cardiac Rehab.  Due to the departmental closure based on national recommendations for Covid-19 we are unable to provide group exercise at this time. Pt is aware of the closure and the option to participate in virtual cardiac rehab from phase I rehab staff. Follow up appt on 03/30/2019. °  °Continue to follow pt.  ° °

## 2019-03-28 ENCOUNTER — Telehealth: Payer: Self-pay | Admitting: Cardiology

## 2019-03-28 NOTE — Telephone Encounter (Signed)
Patient states another doctor gave him Detamethasone Dipropionate cream for rash from latex allergy. He has blisters from this occurrence, can he use this cream?

## 2019-03-28 NOTE — Telephone Encounter (Signed)
He is fine to use. Just not for use on face.

## 2019-03-28 NOTE — Telephone Encounter (Signed)
Telephone call to patient. Told Ok to use cream except not on face. States the rash is on his arm. No further questions

## 2019-03-28 NOTE — Telephone Encounter (Signed)
Please advise. Thanks.  

## 2019-03-29 ENCOUNTER — Telehealth: Payer: Self-pay | Admitting: *Deleted

## 2019-03-29 NOTE — Telephone Encounter (Signed)
Patient called in to state he has a small "knot where catheter was" Procedure was last Friday. Denies warmth or red streaks. Informed him to use warm compresses and if not better to call back. Has a follow up appointment with Dr. Dulce Sellar tomorrow 03/30/2019 and will bring it up to him then also.

## 2019-03-30 ENCOUNTER — Encounter: Payer: Self-pay | Admitting: Cardiology

## 2019-03-30 ENCOUNTER — Telehealth (INDEPENDENT_AMBULATORY_CARE_PROVIDER_SITE_OTHER): Payer: Federal, State, Local not specified - PPO | Admitting: Cardiology

## 2019-03-30 VITALS — Ht 68.0 in | Wt 203.0 lb

## 2019-03-30 DIAGNOSIS — I471 Supraventricular tachycardia, unspecified: Secondary | ICD-10-CM

## 2019-03-30 DIAGNOSIS — E782 Mixed hyperlipidemia: Secondary | ICD-10-CM | POA: Diagnosis not present

## 2019-03-30 DIAGNOSIS — I422 Other hypertrophic cardiomyopathy: Secondary | ICD-10-CM

## 2019-03-30 DIAGNOSIS — I25119 Atherosclerotic heart disease of native coronary artery with unspecified angina pectoris: Secondary | ICD-10-CM | POA: Insufficient documentation

## 2019-03-30 MED ORDER — ATORVASTATIN CALCIUM 80 MG PO TABS: 80.0000 mg | ORAL_TABLET | Freq: Every day | ORAL | 3 refills | Status: DC

## 2019-03-30 MED ORDER — CLOPIDOGREL BISULFATE 75 MG PO TABS: 75.0000 mg | ORAL_TABLET | Freq: Every day | ORAL | 3 refills | Status: DC

## 2019-03-30 NOTE — Patient Instructions (Signed)

## 2019-03-30 NOTE — Progress Notes (Signed)
Virtual Visit via Video Note   This visit type was conducted due to national recommendations for restrictions regarding the COVID-19 Pandemic (e.g. social distancing) in an effort to limit this patient's exposure and mitigate transmission in our community.  Due to his co-morbid illnesses, this patient is at least at moderate risk for complications without adequate follow up.  This format is felt to be most appropriate for this patient at this time.  All issues noted in this document were discussed and addressed.  A limited physical exam was performed with this format.  Please refer to the patient's chart for his consent to telehealth for [facility].   Date:  03/30/2019   ID:  Edward Curtis, DOB 12-Oct-1956, MRN [id number]  Patient Location: Home Provider Location: Office  PCP:  [name], MD  Cardiologist:  Norman Herrlich, MD  Electrophysiologist:  None   Evaluation Performed:  Follow-Up Visit  Chief Complaint:  63 yo male with LVH, HLD, CAD presents for 1 week follow up of PCI and stent of LAD.   History of Present Illness:    Edward Curtis is a 63 y.o. male with LVH hyperlipidemia and CAD and recent PCI and stent of LAD 03/23/19.  Feels well since discharge. Denies chest pain, SOB, DOE. Reports he has information on cardiac rehab and encouraged him to participate in the program. Has had bruising to R radial cath site and some soreness in the R arm. Reassured him that this is common after cath.  Reports 1 episode of waking feeling like his heart was racing in the middle of the night about 3 days ago after taking a benadryl. Advised that this is a prorhythmic medication and since it was a one time episode to call us if episode recurs. Was not associate with pain, SOB, dizziness.   On DAPT - discussed that Plavix would be continued for one year.  Endorses compliance with aspirin and plavix without bleeding complications. Cardiac rehab:   Also reports 7-8 "little red dots" on his L  foot to lower leg. No itching, pain, drainage. Not raised. Reports they do not bother him and advised to follow up with his PCP as needed as these are not related to his cardiac cath.  Procedures  CORONARY STENT INTERVENTION  LEFT HEART CATH AND CORONARY ANGIOGRAPHY  Conclusion    CULPRIT LESION: Prox LAD lesion is 99% stenosed. -Subtotal CTO with TIMI I flow  A drug-eluting stent was successfully placed using a STENT SYNERGY DES 2.5X20. (Taper post dilation 2.9-2.7 mm)  Post intervention, there is a 0% residual stenosis.  The left ventricular systolic function is normal. EF~> 65%. Unable to see apical wall.  LV end diastolic pressure is normal.  SUMMARY  Severe single-vessel CAD involving proximal to mid LAD 99% subtotal occlusion with TIMI I flow successfully treated with synergy DES 2.5 mm x  20 mm postdilated to 2.9-2.7 mm.  Otherwise angiographically normal coronary arteries  Normal/hyperdynamic LVEF with normal LVEDP    The patient does not have symptoms concerning for COVID-19 infection (fever, chills, cough, or new shortness of breath).    Past Medical History:  Diagnosis Date  . Allergy    seasonal allergies   . Arthritis   . Asthma   . Blood transfusion without reported diagnosis 1985  . Calcium oxalate renal stones    early 1990's  . Chicken pox   . Chronic neck pain   . Depression   . GERD (gastroesophageal reflux disease)   .  Hyperlipidemia 10/10/2013  . Hypertrophic obstructive cardiomyopathy(425.11) 10/10/2013  . IBS (irritable bowel syndrome) 12/07/2011  . Migraines   . Neuromuscular disorder (HCC)    radiculopathy left side   . Obesity (BMI 30-39.9) 10/10/2013  . Seasonal allergies    Past Surgical History:  Procedure Laterality Date  . CORONARY STENT INTERVENTION N/A 03/23/2019   Procedure: CORONARY STENT INTERVENTION;  Surgeon: Marykay LexHarding, David W, MD;  [facility];  Service: Cardiovascular;  Laterality: N/A;  . LEFT HEART CATH  AND CORONARY ANGIOGRAPHY N/A 03/23/2019   Procedure: LEFT HEART CATH AND CORONARY ANGIOGRAPHY;  Surgeon: Marykay LexHarding, David W, MD;  [facility];  Service: Cardiovascular;  Laterality: N/A;  . left knee surgery  1990  . MOUTH SURGERY  1975/1993  . pre cancerous mole  2013  . prostate procedure  1980  . umbilical cyst  1985     No outpatient medications have been marked as taking for the 03/30/19 encounter (Appointment) with Baldo DaubMunley, Rivers Hamrick J, MD.     Allergies:   Ciprofloxacin; Iodinated diagnostic agents; Penicillins; Clindamycin/lincomycin; Erythromycin; Ibuprofen; Other; Sulfa antibiotics; and Latex   Social History   Tobacco Use  . Smoking status: Never Smoker  . Smokeless tobacco: Never Used  Substance Use Topics  . Alcohol use: Yes    Alcohol/week: 1.0 standard drinks    Types: 1 Glasses of wine per week    Comment: once a month  . Drug use: No     Family Hx: The patient's family history includes Arthritis in his mother and paternal grandmother; Colon cancer in his paternal grandfather; Diabetes in his father, paternal grandfather, and paternal grandmother; Heart disease in his father, mother, and paternal grandfather; Hyperlipidemia in his maternal grandmother and mother; Hypertension in his father, mother, and another family member. There is no history of Esophageal cancer, Rectal cancer, or Stomach cancer.  ROS:   Please see the history of present illness.     All other systems reviewed and are negative.   Prior CV studies:   The following studies were reviewed today:  03/23/19 LHC:   CULPRIT LESION: Prox LAD lesion is 99% stenosed. -Subtotal CTO with TIMI I flow  A drug-eluting stent was successfully placed using a STENT SYNERGY DES 2.5X20. (Taper post dilation 2.9-2.7 mm)  Post intervention, there is a 0% residual stenosis.  The left ventricular systolic function is normal. EF~> 65%. Unable to see apical wall.  LV end diastolic pressure is normal.    SUMMARY  Severe single-vessel CAD involving proximal to mid LAD 99% subtotal occlusion with TIMI I flow successfully treated with synergy DES 2.5 mm x  20 mm postdilated to 2.9-2.7 mm.  Otherwise angiographically normal coronary arteries  Normal/hyperdynamic LVEF with normal LVEDP     RECOMMENDATIONS  Anticipate same-day discharge after bed rest and TR band removal  Have added atorvastatin 40 mg daily, will defer further medication titration to primary cardiologist  Aspirin plus Plavix for minimum of 6 months, continue Plavix for minimum 1 year, preferably longer given LAD stent.  Echo 09/05/2018:  Study Conclusions   - Left ventricle: The cavity size was normal.with asymetrical   septal hypertrophy 1.9 cm Systolic function was normal. The   estimated ejection fraction was in the range of 60% to 65%. Wall   motion was normal; there were no regional wall motion   abnormalities. Doppler parameters are consistent with abnormal   left ventricular relaxation (grade 1 diastolic dysfunction). - Aortic valve: Transvalvular velocity was  within the normal range   at rest and with valsalva provocation. There was no stenosis.   Valve area (Vmax): 2.67 cm^2. - Ascending aorta: The ascending aorta was mildly dilated.   Impressions:   - 1. There is a mention of hypertrophic cardiomyopathy in the   patient&'s chart however the study at best the interventricular   septum is a little more thicker than the rest of the left   ventricle. In addition I did not appreciate systolic anterior   motion of the mitral valve or any left ventricular outflow tract   gradient at rest. There was no evidence of any significant mitral   regurgitation either. The study is technically difficult and   clinical correlation is suggested. There is impaired relaxation   of the left ventricle.   2. Prominent coronary sinus. Unclear clinical significance.   Clinical correlation suggested.     Labs/Other Tests and  Data Reviewed:    EKG:  An ECG dated 03/26/19 was personally reviewed today and demonstrated:  NSR, incomplete RBBB (poor R wave progression),   Recent Labs: 08/31/2018: ALT 15; NT-Pro BNP 43 03/16/2019: BUN 10; Creatinine, Ser 0.94; Hemoglobin 15.8; Platelets 225; Potassium 4.3; Sodium 142; TSH 3.320   Recent Lipid Panel Lab Results  Component Value Date/Time   CHOL 262 (H) 09/04/2014 09:26 AM   TRIG 317.0 (H) 09/04/2014 09:26 AM   HDL 27.60 (L) 09/04/2014 09:26 AM   CHOLHDL 9 09/04/2014 09:26 AM   LDLCALC 126 (H) 10/01/2013 02:07 PM   LDLDIRECT 151.2 09/04/2014 09:26 AM    Wt Readings from Last 3 Encounters:  03/23/19 214 lb (97.1 kg)  03/16/19 216 lb (98 kg)  03/13/19 212 lb (96.2 kg)     Objective:    Vital Signs:  There were no vitals taken for this visit.   VITAL SIGNS:  reviewed GEN:  no acute distress EYES:  sclerae anicteric, EOMI - Extraocular Movements Intact RESPIRATORY:  normal respiratory effort, symmetric expansion SKIN:  ecchymosis from yellow to green on R radial cath site, no drainage or exudate NEURO:  alert and oriented x 3, no obvious focal deficit PSYCH:  normal affect I visually inspected his hand he has no ischemia full motor function and he has some ecchymosis but no hematoma.  He is having pain in the right arm which I think is from stretcher passively vascular catheter and reassured him that it should improve ASSESSMENT & PLAN:    1. CAD s/p DES to LAD 03/23/19: NYHA class I. No anginal symptoms. R arm sore from cardiac cath site - able to view arm on televisit which shows lessening ecchymosis. Encouragement given that this is common after cardiac cath.   Continue GDMT of aspirin, verapamil, and statin.   Continue DAPT of Plavix and aspirin for at least one year - consider extending beyond one year as stent was to LAD.   Encouraged him to participate in cardiac rehab.  2. HLD: Continue high intensity statin. Recommend low fat, heart healthy diet.  Plan to recheck lipid panel at follow up in 3 months.  3. History of SVT: One episode of elevated HR last week in the middle of the night after taking benadryl. Episode lasted only a second, self-resolved, and was not associated with pain or SOB.    Recommended he avoid pro-arrthymic medications and call office if recurrent.  4. Cardiomyopathy: Echo 09/14/2018 EF 60-65%, asymetical septal hypertophy 1.9cm. Grade 1 diastolic dysfunction. No edema. Does not routinely check his BP at  home as he is uncertain how to use cuff.   Bring BP cuff to next office visit for teaching on how to use.  5. Obesity: Risk factor for coronary disease. Weight loss encouraged.  6. Prediabetes: Monitored by PCP. Additional risk factor for coronary disease. Encourage low carbohydrate diet.  COVID-19 Education: The signs and symptoms of COVID-19 were discussed with the patient and how to seek care for testing (follow up with PCP or arrange E-visit).  The importance of social distancing was discussed today.  Time:   Today, I have spent 25 minutes with the patient with telehealth technology discussing the above problems.     Medication Adjustments/Labs and Tests Ordered: Current medicines are reviewed at length with the patient today.  Concerns regarding medicines are outlined above.   Tests Ordered: No orders of the defined types were placed in this encounter.   Medication Changes: No orders of the defined types were placed in this encounter.   Disposition:  Follow up in 3 month(s) in office  Signed, Norman Herrlich, MD  03/30/2019 8:38 AM    [facility]

## 2019-04-04 ENCOUNTER — Telehealth (HOSPITAL_COMMUNITY): Payer: Self-pay | Admitting: *Deleted

## 2019-04-04 NOTE — Telephone Encounter (Signed)
         Confirm Consent - In the setting of the current Covid19 crisis, you are scheduled for a phone visit with your Cardiac or Pulmonary team member.  Just as we do with many in-gym visits, in order for you to participate in this visit, we must obtain consent.  If you'd like, I can send this to your mychart (if signed up) or email for you to review.  Otherwise, I can obtain your verbal consent now.  By agreeing to a telephone visit, we'd like you to understand that the technology does not allow for your Cardiac or Pulmonary Rehab team member to perform a physical assessment, and thus may limit their ability to fully assess your ability to perform exercise programs. If your provider identifies any concerns that need to be evaluated in person, we will make arrangements to do so.  Finally, though the technology is pretty good, we cannot assure that it will always work on either your or our end and we cannot ensure that we have a secure connection.  Cardiac and Pulmonary Rehab Telehealth visits and "At Home" cardiac and pulmonary rehab are provided at no cost to you.        Are you willing to proceed?"        STAFF: Did the patient verbally acknowledge consent to telehealth visit? Document YES/NO here: Yes     Barnet Pall RN  Cardiac and Pulmonary Rehab Staff        June 6,2020    9:21 AM  Pt is interested in participating in Virtual Cardiac Rehab. Pt advised that Virtual Cardiac Rehab is provided at no cost to the patient.  Checklist:  1. Pt has smart device  ie smartphone and/or ipad for downloading an app  Yes 2. Reliable internet/wifi service    Yes 3. Understands how to use their smartphone and navigate within an app.  Yes 4.  Reviewed with pt the scheduling process for virtual cardiac rehab.  Pt verbalized understanding. Appointment scheduled for Friday Edward Gave RN BSN

## 2019-04-05 ENCOUNTER — Encounter (HOSPITAL_COMMUNITY): Payer: Self-pay | Admitting: *Deleted

## 2019-04-05 NOTE — Progress Notes (Signed)
Following notification sent to Dr. Bettina Gavia:  Dr.   Bettina Gavia   As you are aware our department remains closed to patients due to Covid-19.  We are excited to be able to offer an alternative to traditional onsite Cardiac Rehab while your patient continues to follow [employer] guidelines.  This is a notification that your patient has been contacted and is very interested in participating in Virtual Cardiac Rehab.  Thank you for your continued support in helping [place] meet the health care needs of our patients.  Cherre Huger, BSN Cardiac and Engineer, petroleum

## 2019-04-05 NOTE — Telephone Encounter (Signed)
2 wks s/p heart cath via right radial artery. Patient is concerned that he has a swollen bluish colored bruise about 1 inch in diameter about 4-5 inches above radial access site that appeared today. Denies numbness/tingling/weakness to forearm or hand and states it is not increasing in size. He was doing a lot of stapling at work today and wonders if that could have caused it. Radial access site with just pea-sized swollen area. Initial post-procedure arm edema has dissipated.    Pt is taking plavix and baby ASA. Suggested he could elevate arm when seated and with being on 2 blood thinners, he will be more likely to develop bruises from minor bumping up against objects.

## 2019-04-05 NOTE — Telephone Encounter (Signed)
Set up office visit

## 2019-04-06 ENCOUNTER — Telehealth: Payer: Federal, State, Local not specified - PPO | Admitting: Cardiology

## 2019-04-06 ENCOUNTER — Telehealth (HOSPITAL_COMMUNITY): Payer: Self-pay | Admitting: *Deleted

## 2019-04-06 ENCOUNTER — Other Ambulatory Visit: Payer: Self-pay

## 2019-04-06 ENCOUNTER — Encounter (HOSPITAL_COMMUNITY)
Admission: RE | Admit: 2019-04-06 | Discharge: 2019-04-06 | Disposition: A | Discharge status: Home / Self Care | Payer: Self-pay | Source: Ambulatory Visit | Attending: Cardiology | Admitting: Cardiology

## 2019-04-06 ENCOUNTER — Telehealth: Payer: Self-pay | Admitting: Cardiology

## 2019-04-06 ENCOUNTER — Encounter: Payer: Self-pay | Admitting: Cardiology

## 2019-04-06 ENCOUNTER — Ambulatory Visit (INDEPENDENT_AMBULATORY_CARE_PROVIDER_SITE_OTHER): Payer: Federal, State, Local not specified - PPO | Admitting: Cardiology

## 2019-04-06 ENCOUNTER — Telehealth: Payer: Self-pay

## 2019-04-06 VITALS — BP 118/64 | HR 92 | Temp 98.1°F | Ht 68.0 in | Wt 212.0 lb

## 2019-04-06 DIAGNOSIS — I471 Supraventricular tachycardia: Secondary | ICD-10-CM | POA: Diagnosis not present

## 2019-04-06 DIAGNOSIS — E782 Mixed hyperlipidemia: Secondary | ICD-10-CM

## 2019-04-06 DIAGNOSIS — I25119 Atherosclerotic heart disease of native coronary artery with unspecified angina pectoris: Secondary | ICD-10-CM

## 2019-04-06 MED ORDER — ROSUVASTATIN CALCIUM 10 MG PO TABS: 10.0000 mg | ORAL_TABLET | Freq: Every day | ORAL | 0 refills | Status: DC

## 2019-04-06 NOTE — Telephone Encounter (Addendum)
      Richardo Priest, MD Cardiology Advice Only Reason for call Conversation: Advice Only (Newest Message First) April 05, 2019 Richardo Priest, MD to Me     5:07 PM Note    Set up office visit       2:28 PM You routed this conversation to Richardo Priest, MD  Me     2:28 PM Note      2 wks s/p heart cath via right radial artery. Patient is concerned that he has a swollen bluish colored bruise about 1 inch in diameter about 4-5 inches above radial access site that appeared today. Denies numbness/tingling/weakness to forearm or hand and states it is not increasing in size. He was doing a lot of stapling at work today and wonders if that could have caused it. Radial access site with just pea-sized swollen area. Initial post-procedure arm edema has dissipated.    Pt is taking plavix and baby ASA. Suggested he could elevate arm when seated and with being on 2 blood thinners, he will be more likely to develop bruises from minor bumping up against objects.

## 2019-04-06 NOTE — Telephone Encounter (Addendum)
Phoned patient, informed that Dr. Bettina Gavia would like to see him in the office. Scheduled for a 1625 appointment today. He can address both constipation and RUE bruising at that time. Pt. Verbalizes understanding     2 wks s/p heart cath via right radial artery. Patient is concerned that he has a swollen bluish colored bruise about 1 inch in diameter about 4-5 inches above radial access site that appeared today. Denies numbness/tingling/weakness to forearm or hand and states it is not increasing in size. He was doing a lot of stapling at work today and wonders if that could have caused it. Radial access site with just pea-sized swollen area. Initial post-procedure arm edema has dissipated.    Pt is taking plavix and baby ASA. Suggested he could elevate arm when seated and with being on 2 blood thinners, he will be more likely to develop bruises from minor bumping up against objects.         Error initially concerning this documentation. This note has the appropriate sequence of documentation has been updated in this note.

## 2019-04-06 NOTE — Telephone Encounter (Signed)
Patient states he is having very bad constipation and needs to know what to take for relief. He wonders if Lipitor is causing this. Please advise.

## 2019-04-06 NOTE — Telephone Encounter (Signed)
Error in entering information , please additional encounter from 04/06/2019

## 2019-04-06 NOTE — Progress Notes (Signed)
Cardiology Office Note:    Date:  04/06/2019   ID:  Edward JeffersonRichard Bedonie, DOB [birth year], MRN [id number]  PCP:  Nelwyn SalisburyFry, Stephen A, MD  Cardiologist:  Norman HerrlichBrian Kameisha Malicki, MD    Referring MD: Nelwyn SalisburyFry, Stephen A, MD    ASSESSMENT:    No diagnosis found. PLAN:    In order of problems listed above:  1. CAD - Denies chest pain, SOB, DOE. States he feels "ten years younger". Visually assessed bruising to R radial cath site. No hematoma, 2+ radial pulse, less than 3 second capillary refill. Encouraged to use a pillow or towel under his R arm when working at his desk. Reassured that bruising may take up to 4 weeks to resolve completely. Encouraged him to continue with cardiac rehab. He has been researching a plant centered diet and this was encouraged.  2. HLD - Complains of constipation which he attributes to Atorvastatin 80. Will give 1 week drug holiday then initiate Rosuvastatin 10mg  daily. Anticipate recheck of lipid panel at office visit in September.  3. PSVT - No recurrence. Denies palpitations, flutter. Continue Verapamil.    Next appointment: as previously scheduled in September   Medication Adjustments/Labs and Tests Ordered: Current medicines are reviewed at length with the patient today.  Concerns regarding medicines are outlined above.  No orders of the defined types were placed in this encounter.  No orders of the defined types were placed in this encounter.   No chief complaint on file.   History of Present Illness:    Edward Curtis is a 63 y.o. male with a hx of CAD and recent PCI of LAD 03/23/19 being seen for bruising RUE at arterial puncture site.He was last seen 03/30/19 virtual visit..  Since that time he has been concerned regarding the bruising to his R wrist from cath. States he noticed new swelling and bruising a couple days ago, but it has resolved. He noticed that it originated exactly where his arm rests on the edge of his wooden desk. Encouraged him to use a pillow or towel  under his arm while working.   He has been participating in cardiac rehab. 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 Well-appearing on exam. Endorses constipation which he attributes to his statin - will give him a one week holiday and change his statin to rosuvastatin.    Compliance with diet, lifestyle and medications: Yes Past Medical History:  Diagnosis Date  . Allergy    seasonal allergies   . Arthritis   . Asthma   . Blood transfusion without reported diagnosis 1985  . Calcium oxalate renal stones    early 1990's  . Chicken pox   . Chronic neck pain   . Depression   . GERD (gastroesophageal reflux disease)   . Hyperlipidemia 10/10/2013  . Hypertrophic obstructive cardiomyopathy(425.11) 10/10/2013  . IBS (irritable bowel syndrome) 12/07/2011  . Migraines   . Neuromuscular disorder (HCC)    radiculopathy left side   . Obesity (BMI 30-39.9) 10/10/2013  . Seasonal allergies     Past Surgical History:  Procedure Laterality Date  . CORONARY STENT INTERVENTION N/A 03/23/2019   Procedure: CORONARY STENT INTERVENTION;  Surgeon: Marykay LexHarding, David W, MD;  [facility];  Service: Cardiovascular;  Laterality: N/A;  . LEFT HEART CATH AND CORONARY ANGIOGRAPHY N/A 03/23/2019   Procedure: LEFT HEART CATH AND CORONARY ANGIOGRAPHY;  Surgeon: Marykay LexHarding, David W, MD;  [facility]  [facility];  Service: Cardiovascular;  Laterality: N/A;  . left knee surgery  1990  . MOUTH SURGERY  1975/1993  . pre cancerous mole  2013  . prostate procedure  1980  . umbilical cyst  1985    Current Medications: No outpatient medications have been marked as taking for the 04/06/19 encounter (Appointment) with Baldo DaubMunley, Costas Sena J, MD.     Allergies:   Ciprofloxacin, Iodinated diagnostic agents, Penicillins, Clindamycin/lincomycin, Erythromycin, Ibuprofen, Other, Sulfa antibiotics, and Latex   Social History   Socioeconomic  History  . Marital status: Single    Spouse name: Not on file  . Number of children: Not on file  . Years of education: Not on file  . Highest education level: Not on file  Occupational History  . Not on file  Social Needs  . Financial resource strain: Not on file  . Food insecurity    Worry: Not on file    Inability: Not on file  . Transportation needs    Medical: Not on file    Non-medical: Not on file  Tobacco Use  . Smoking status: Never Smoker  . Smokeless tobacco: Never Used  Substance and Sexual Activity  . Alcohol use: Yes    Alcohol/week: 1.0 standard drinks    Types: 1 Glasses of wine per week    Comment: once a month  . Drug use: No  . Sexual activity: Not Currently  Lifestyle  . Physical activity    Days per week: Not on file    Minutes per session: Not on file  . Stress: Not on file  Relationships  . Social Musicianconnections    Talks on phone: Not on file    Gets together: Not on file    Attends religious service: Not on file    Active member of club or organization: Not on file    Attends meetings of clubs or organizations: Not on file    Relationship status: Not on file  Other Topics Concern  . Not on file  Social History Narrative  . Not on file     Family History: The patient's family history includes Arthritis in his mother and paternal grandmother; Colon cancer in his paternal grandfather; Diabetes in his father, paternal grandfather, and paternal grandmother; Heart disease in his father, mother, and paternal grandfather; Hyperlipidemia in his maternal grandmother and mother; Hypertension in his father, mother, and another family member. There is no history of Esophageal cancer, Rectal cancer, or Stomach cancer. ROS:   Please see the history of present illness.    Review of Systems  Constitution: Negative for chills, fever and malaise/fatigue.  Cardiovascular: Negative for chest pain, dyspnea on exertion, irregular heartbeat, leg swelling and  palpitations.  Respiratory: Negative for cough, shortness of breath and snoring.   Skin: Positive for color change (bruise to RUE).  Gastrointestinal: Positive for constipation and nausea (resolved after taking medications with food instead of without food). Negative for diarrhea and vomiting.    All other systems reviewed and are negative.  EKGs/Labs/Other Studies Reviewed:    The following studies were reviewed today: 03/23/19 LHC:   CULPRIT LESION: Prox LAD lesion is 99% stenosed. -Subtotal CTO with TIMI I flow  A drug-eluting stent was successfully placed using a STENT SYNERGY DES 2.5X20. (Taper post dilation 2.9-2.7 mm)  Post intervention, there is a 0% residual stenosis.  The left ventricular systolic function is normal. EF~> 65%. Unable to see apical wall.  LV end diastolic pressure is normal.  SUMMARY  Severe single-vessel CAD involving proximal to mid LAD 99% subtotal occlusion with TIMI I flow successfully treated with synergy DES 2.5 mm x  20 mm postdilated to 2.9-2.7 mm.  Otherwise angiographically normal coronary arteries  Normal/hyperdynamic LVEF with normal LVEDP     RECOMMENDATIONS  Anticipate same-day discharge after bed rest and TR band removal  Have added atorvastatin 40 mg daily, will defer further medication titration to primary cardiologist  Aspirin plus Plavix for minimum of 6 months, continue Plavix for minimum 1 year, preferably longer given LAD stent.   Echo 09/05/2018:  Study Conclusions   - Left ventricle: The cavity size was normal.with asymetrical   septal hypertrophy 1.9 cm Systolic function was normal. The   estimated ejection fraction was in the range of 60% to 65%. Wall   motion was normal; there were no regional wall motion   abnormalities. Doppler parameters are consistent with abnormal   left ventricular relaxation (grade 1 diastolic dysfunction). - Aortic valve: Transvalvular velocity was within the normal range   at rest and  with valsalva provocation. There was no stenosis.   Valve area (Vmax): 2.67 cm^2. - Ascending aorta: The ascending aorta was mildly dilated.   Impressions:   - 1. There is a mention of hypertrophic cardiomyopathy in the   patient&'s chart however the study at best the interventricular   septum is a little more thicker than the rest of the left   ventricle. In addition I did not appreciate systolic anterior   motion of the mitral valve or any left ventricular outflow tract   gradient at rest. There was no evidence of any significant mitral   regurgitation either. The study is technically difficult and   clinical correlation is suggested. There is impaired relaxation   of the left ventricle.   2. Prominent coronary sinus. Unclear clinical significance.   Clinical correlation suggested.      EKG:  EKG none today.   Recent Labs: 08/31/2018: ALT 15; NT-Pro BNP 43 03/16/2019: BUN 10; Creatinine, Ser 0.94; Hemoglobin 15.8; Platelets 225; Potassium 4.3; Sodium 142; TSH 3.320  Recent Lipid Panel    Component Value Date/Time   CHOL 262 (H) 09/04/2014 0926   TRIG 317.0 (H) 09/04/2014 0926   HDL 27.60 (L) 09/04/2014 0926   CHOLHDL 9 09/04/2014 0926   VLDL 63.4 (H) 09/04/2014 0926   LDLCALC 126 (H) 10/01/2013 1407   LDLDIRECT 151.2 09/04/2014 0926    Physical Exam:    VS:  There were no vitals taken for this visit.    Wt Readings from Last 3 Encounters:  03/30/19 203 lb (92.1 kg)  03/23/19 214 lb (97.1 kg)  03/16/19 216 lb (98 kg)     GEN:  Well nourished, well developed in no acute distress HEENT: Normal NECK: No JVD; No carotid bruits LYMPHATICS: No lymphadenopathy CARDIAC: RRR, no murmurs, rubs, gallops VVS: R and L radial pulses 2+ bilaterally RESPIRATORY:  Clear to auscultation without rales, wheezing or rhonchi  ABDOMEN: Firm, non-tender, non-distended MUSCULOSKELETAL:  No edema; No deformity  SKIN: Warm and dry. Ecchymosis to R wrist at cath site. Varied stages of  healing purple, yellow, green. No hematoma.  NEUROLOGIC:  Alert and oriented x 3 PSYCHIATRIC:  Normal affect    Signed, Shirlee More, MD  04/06/2019 10:06 AM    [place]

## 2019-04-06 NOTE — Telephone Encounter (Signed)
Called and spoke to pt regarding Virtual Cardiac Rehab.  Pt  was able to download the [employer] app on their smart device with no issues. Pt set up their account and received the following welcome message -"Welcome to the [place] and Pulmonary Rehabilitation program. We hope that you will find the exercise program beneficial in your recovery process. Our staff is available to assist with any questions/concerns about your exercise routine. Best wishes". Brief orientation provided to with the advisement to watch the "Intro to Rehab" series located under the Resource tab. Pt verbalized understanding. Will continue to follow and monitor pt progress with feedback as needed.Edward Curtis says he is going to hold off on walking right now until after he see his podiatrist as he is having a problem with his left foot. [name] plans to use the APP to log his exercise when he is ready.Barnet Pall, RN,BSN 04/06/2019 2:41 PM

## 2019-04-06 NOTE — Telephone Encounter (Signed)
2 wks s/p heart cath via right radial artery. Patient is concerned that he has a swollen bluish colored bruise about 1 inch in diameter about 4-5 inches above radial access site that appeared today. Denies numbness/tingling/weakness to forearm or hand and states it is not increasing in size. He was doing a lot of stapling at work today and wonders if that could have caused it. Radial access site with just pea-sized swollen area. Initial post-procedure arm edema has dissipated.    Pt is taking plavix and baby ASA. Suggested he could elevate arm when seated and with being on 2 blood thinners, he will be more likely to develop bruises from minor bumping up against objects.     

## 2019-04-06 NOTE — Patient Instructions (Addendum)
Medication Instructions:  Your physician has recommended you make the following change in your medication:   STOP atorvastatin (lipitor)   START rosuvastatin (crestor) 10 mg: Take 1 tablet daily in the evening: START IN 1 WEEK after stopping atorvastatin  If you need a refill on your cardiac medications before your next appointment, please call your pharmacy.   Lab work: None  If you have labs (blood work) drawn today and your tests are completely normal, you will receive your results only by: [name]. MyChart Message (if you have MyChart) OR . A paper copy in the mail If you have any lab test that is abnormal or we need to change your treatment, we will call you to review the results.  Testing/Procedures: None  Follow-Up: At [facility], you and your health needs are our priority.  As part of our continuing mission to provide you with exceptional heart care, we have created designated Provider Care Teams.  These Care Teams include your primary Cardiologist (physician) and Advanced Practice Providers (APPs -  Physician Assistants and Nurse Practitioners) who all work together to provide you with the care you need, when you need it. You will need a follow up appointment in 3 months: Friday, 07/06/2019, at 9:00 am [facility].      Rosuvastatin Tablets What is this medicine? ROSUVASTATIN (roe SOO va sta tin) is known as a HMG-CoA reductase inhibitor or 'statin'. It lowers cholesterol and triglycerides in the blood. This drug may also reduce the risk of heart attack, stroke, or other health problems in patients with risk factors for heart disease. Diet and lifestyle changes are often used with this drug. This medicine may be used for other purposes; ask your health care provider or pharmacist if you have questions. COMMON BRAND NAME(S): Crestor What should I tell my health care provider before I take this medicine? They need to know if you have any of these  conditions: -diabetes -if you often drink alcohol -history of stroke -kidney disease -liver disease -muscle aches or weakness -thyroid disease -an unusual or allergic reaction to rosuvastatin, other medicines, foods, dyes, or preservatives -pregnant or trying to get pregnant -breast-feeding How should I use this medicine? Take this medicine by mouth with a glass of water. Follow the directions on the prescription label. Do not cut, crush or chew this medicine. You can take this medicine with or without food. Take your doses at regular intervals. Do not take your medicine more often than directed. Talk to your pediatrician regarding the use of this medicine in children. While this drug may be prescribed for children as young as 64 years old for selected conditions, precautions do apply. Overdosage: If you think you have taken too much of this medicine contact a poison control center or emergency room at once. NOTE: This medicine is only for you. Do not share this medicine with others. What if I miss a dose? If you miss a dose, take it as soon as you can. If your next dose is to be taken in less than 12 hours, then do not take the missed dose. Take the next dose at your regular time. Do not take double or extra doses. What may interact with this medicine? Do not take this medicine with any of the following medications: -herbal medicines like red yeast rice This medicine may also interact with the following medications: -alcohol -antacids containing aluminum hydroxide or magnesium hydroxide -cyclosporine -other medicines for high cholesterol -some medicines for HIV infection -  warfarin This list may not describe all possible interactions. Give your health care provider a list of all the medicines, herbs, non-prescription drugs, or dietary supplements you use. Also tell them if you smoke, drink alcohol, or use illegal drugs. Some items may interact with your medicine. What should I watch for  while using this medicine? Visit your doctor or health care professional for regular check-ups. You may need regular tests to make sure your liver is working properly. Your health care professional may tell you to stop taking this medicine if you develop muscle problems. If your muscle problems do not go away after stopping this medicine, contact your health care professional. Do not become pregnant while taking this medicine. Women should inform their health care professional if they wish to become pregnant or think they might be pregnant. There is a potential for serious side effects to an unborn child. Talk to your health care professional or pharmacist for more information. Do not breast-feed an infant while taking this medicine. This medicine may affect blood sugar levels. If you have diabetes, check with your doctor or health care professional before you change your diet or the dose of your diabetic medicine. If you are going to need surgery or other procedure, tell your doctor that you are using this medicine. This drug is only part of a total heart-health program. Your doctor or a dietician can suggest a low-cholesterol and low-fat diet to help. Avoid alcohol and smoking, and keep a proper exercise schedule. This medicine may cause a decrease in Co-Enzyme Q-10. You should make sure that you get enough Co-Enzyme Q-10 while you are taking this medicine. Discuss the foods you eat and the vitamins you take with your health care professional. What side effects may I notice from receiving this medicine? Side effects that you should report to your doctor or health care professional as soon as possible: -allergic reactions like skin rash, itching or hives, swelling of the face, lips, or tongue -dark urine -fever -joint pain -muscle cramps, pain -redness, blistering, peeling or loosening of the skin, including inside the mouth -trouble passing urine or change in the amount of urine -unusually weak or  tired -yellowing of the eyes or skin Side effects that usually do not require medical attention (report to your doctor or health care professional if they continue or are bothersome): -constipation -heartburn -nausea -stomach gas, pain, upset This list may not describe all possible side effects. Call your doctor for medical advice about side effects. You may report side effects to FDA at 1-800-FDA-1088. Where should I keep my medicine? Keep out of the reach of children. Store at room temperature between 20 and 25 degrees C (68 and 77 degrees F). Keep container tightly closed (protect from moisture). Throw away any unused medicine after the expiration date. NOTE: This sheet is a summary. It may not cover all possible information. If you have questions about this medicine, talk to your doctor, pharmacist, or health care provider.  2019 Elsevier/Gold Standard (2017-06-14 12:42:43)

## 2019-04-06 NOTE — Telephone Encounter (Signed)
Notes added on 04/06/19 are regarding separate encounter. Please see 04/06/2019 encounter

## 2019-04-24 ENCOUNTER — Telehealth (HOSPITAL_COMMUNITY): Payer: Self-pay | Admitting: *Deleted

## 2019-05-01 ENCOUNTER — Telehealth: Payer: Self-pay | Admitting: Cardiology

## 2019-05-01 NOTE — Telephone Encounter (Signed)
Edward Curtis is having his teeth cleaned tomorrow (05/02/2019) and just learned he needs written consent from cardiologist before they will proceed with the cleaning.  Please fax consent to [facility], [place], [phone]. Please call patient to let him know as well if it is ok he proceed with the cleaning at [phone]

## 2019-05-01 NOTE — Telephone Encounter (Signed)
RN left message for patient to call back concerning upcoming dental appt. Needs to verify that patient is exercising 30 minutes a day and if [facility] has epinephrine on hand.

## 2019-05-02 NOTE — Telephone Encounter (Signed)
Prefer not to use adrenaline in view of cardiac history. Pl let them  Know.

## 2019-05-02 NOTE — Telephone Encounter (Signed)
Spoke with Edward Curtis- [facility] and they have epi pens on hand. Patient states he exercise about 2 times a week and was informed to continue to take plavix uninterrupted during dental procedure. Clearance letter given to Dr. Geraldo Pitter for signature will be faxed to [phone].

## 2019-05-18 DIAGNOSIS — M21622 Bunionette of left foot: Secondary | ICD-10-CM | POA: Diagnosis not present

## 2019-05-18 DIAGNOSIS — M71572 Other bursitis, not elsewhere classified, left ankle and foot: Secondary | ICD-10-CM | POA: Diagnosis not present

## 2019-05-28 ENCOUNTER — Telehealth: Payer: Self-pay | Admitting: Cardiology

## 2019-05-28 NOTE — Telephone Encounter (Signed)
Patient is having issues with the meds that he is taking and he thinks he is having issues with the Crestor. Please call him, he states that he called Friday??

## 2019-05-28 NOTE — Telephone Encounter (Signed)
Stop crestor 

## 2019-05-28 NOTE — Telephone Encounter (Signed)
Patient states that he started crestor 10 mg daily 6-7 weeks ago after his last office visit on 04/06/2019. Since then, he has been experiencing muscle aches, joint pain, and episodes of hypoglycemia. He has a history of hypoglycemia but has not had an episode in a long time until starting crestor. Patient also reports that "food does not taste right since starting crestor." Please advise.

## 2019-05-29 NOTE — Telephone Encounter (Signed)
Telephone  Call to patient. Informed to stop Crestor. Patient verbalized understanding.

## 2019-06-25 ENCOUNTER — Telehealth: Payer: Self-pay | Admitting: Cardiology

## 2019-06-25 NOTE — Telephone Encounter (Signed)
Patient states that about 5 weeks after starting taking Plavix he started experiencing a weird taste in his mouth.  He describes the taste as "going to dentist office and rinsing with warm salty water."  This usually occurs mid morning and he takes his Plavix in the morning.  He states the taste can worsen throughout the day, so much to the point it almost makes him loose his appetite.  He has been experiencing this for about 2 months.  Please advise.

## 2019-06-25 NOTE — Telephone Encounter (Signed)
TasteIt would be unusual but possible.  There are couple of medications that do cause disturbance particularly Flagyl was 1 of them and a trick  is to take it with 1 or 2 saltine crackers which seems to be helpful

## 2019-06-25 NOTE — Telephone Encounter (Signed)
Patient is reporting a weird taste in his mouth, he takes Plavix in the morning and he wonders if it is tied to this. Please advise.

## 2019-06-26 NOTE — Telephone Encounter (Signed)
Patient aware of Dr Joya Gaskins recommendation to try 1 to 2 saltine crackers when taking medication. Patient agreed to plan and verbalized understanding.  Patient has concerns if this could be an ongoing side effect of having Covid. He asked should he be tested.  Patient was advised to contact PCP for further discussion regarding Covid testing.  Patient has no further questions and agreed to plan.

## 2019-07-03 ENCOUNTER — Other Ambulatory Visit: Payer: Self-pay

## 2019-07-03 ENCOUNTER — Ambulatory Visit: Payer: Federal, State, Local not specified - PPO | Admitting: Family Medicine

## 2019-07-03 ENCOUNTER — Encounter: Payer: Self-pay | Admitting: Family Medicine

## 2019-07-03 VITALS — BP 120/64 | HR 103 | Temp 98.1°F | Wt 217.4 lb

## 2019-07-03 DIAGNOSIS — K0500 Acute gingivitis, plaque induced: Secondary | ICD-10-CM | POA: Diagnosis not present

## 2019-07-03 DIAGNOSIS — R432 Parageusia: Secondary | ICD-10-CM

## 2019-07-03 MED ORDER — DOXYCYCLINE HYCLATE 100 MG PO CAPS: 100.0000 mg | ORAL_CAPSULE | Freq: Two times a day (BID) | ORAL | 0 refills | Status: AC

## 2019-07-03 NOTE — Progress Notes (Signed)
   Subjective:    Patient ID: Edward Curtis, male    DOB: 1956-05-19, 63 y.o.   MRN: [id number]  HPI Here to discuss a "funny taste" in his mouth that started about 2 months ago. He says this is a salty taste. No change in his sense of smell. He does have some mild allergies that cause some runny nose and PND, but there is no fever or headache or ST or cough or SOB. He does admits to some soreness around the right upper bicuspid tooth for several weeks.    Review of Systems  Constitutional: Negative.   HENT: Positive for postnasal drip and rhinorrhea. Negative for congestion, sinus pressure, sinus pain and sore throat.   Eyes: Negative.   Respiratory: Negative.   Cardiovascular: Negative.        Objective:   Physical Exam Constitutional:      Appearance: Normal appearance.  HENT:     Right Ear: Tympanic membrane, ear canal and external ear normal.     Left Ear: Tympanic membrane and ear canal normal.     Nose: Nose normal.     Mouth/Throat:     Pharynx: No oropharyngeal exudate or posterior oropharyngeal erythema.     Comments: The right upper bicuspid is a little tender at the gum beneath it  Eyes:     Conjunctiva/sclera: Conjunctivae normal.  Neurological:     Mental Status: He is alert.           Assessment & Plan:  He has sine gingivitis, and I suspect the taste in his mouth is the result of some prurient drainage from a local infection. We will treat this with a course of Doxycycline, and he needs to see his dentist asap.  Alysia Penna, MD

## 2019-07-05 NOTE — Progress Notes (Signed)
Cardiology Office Note:    Date:  07/06/2019   ID:  Edward Curtis, DOB 1955/11/01, MRN [id number]  PCP:  [name], MD  Cardiologist:  Norman Herrlich, MD    Referring MD: [name], MD    ASSESSMENT:    1. Hyperlipidemia, unspecified hyperlipidemia type   2. Coronary artery disease involving native coronary artery of native heart with angina pectoris (HCC)    PLAN:    In order of problems listed above:  1. Presently off of the statin due to muscle symptoms reviewed the options of PCSK9 or reinitiating with a low-dose of a low intensity statin will start with pravastatin check lipids in 6 weeks and if LDL greater than 70 add Zetia.  Are not he will require PCSK9 and referral to lipid clinic 2. Stable CAD reinforcing the need for uninterrupted dual antiplatelet therapy especially pending dental procedures and a note will be given to him to hand to the dentist.   Next appointment: 6 months   Medication Adjustments/Labs and Tests Ordered: Current medicines are reviewed at length with the patient today.  Concerns regarding medicines are outlined above.  Orders Placed This Encounter  Procedures  . Lipid Profile  . Comprehensive Metabolic Panel (CMET)   Meds ordered this encounter  Medications  . pravastatin (PRAVACHOL) 20 MG tablet    Sig: Take 1 tablet (20 mg total) by mouth every evening.    Dispense:  90 tablet    Refill:  1    No chief complaint on file.   History of Present Illness:    Edward Curtis is a 63 y.o. male with a hx of  CAD and  PCI of LAD 03/23/19    last seen 04/06/2019. Compliance with diet, lifestyle and medications: Yes  Cardiac perspective is done well no chest pain shortness of breath exercise intolerance palpitation or syncope.  Other issues prevail including intolerance to rosuvastatin with muscle pain withdrawn recovered and will start a low-dose of a low intensity statin and complaints of foul taste which was related in the end of  sinusitis and dental issues and has a dental appointment scheduled.  I reinforced with him even if he has a dental extraction not to interrupt dual antiplatelet therapy. Past Medical History:  Diagnosis Date  . Allergy    seasonal allergies   . Arthritis   . Asthma   . Blood transfusion without reported diagnosis 1985  . Calcium oxalate renal stones    early 1990's  . Chicken pox   . Chronic neck pain   . Depression   . GERD (gastroesophageal reflux disease)   . Hyperlipidemia 10/10/2013  . Hypertrophic obstructive cardiomyopathy(425.11) 10/10/2013  . IBS (irritable bowel syndrome) 12/07/2011  . Migraines   . Neuromuscular disorder (HCC)    radiculopathy left side   . Obesity (BMI 30-39.9) 10/10/2013  . Seasonal allergies     Past Surgical History:  Procedure Laterality Date  . CORONARY STENT INTERVENTION N/A 03/23/2019   Procedure: CORONARY STENT INTERVENTION;  Surgeon: Marykay Lex, MD;  Location[facility] INVASIVE CV LAB;  Service: Cardiovascular;  Laterality: N/A;  . LEFT HEART CATH AND CORONARY ANGIOGRAPHY N/A 03/23/2019   Procedure: LEFT HEART CATH AND CORONARY ANGIOGRAPHY;  Surgeon: Marykay Lex, MD;  Location: [facility] INVASIVE CV LAB;  Service: Cardiovascular;  Laterality: N/A;  . left knee surgery  1990  . MOUTH SURGERY  1975/1993  . pre cancerous mole  2013  . prostate procedure  1980  . umbilical  cyst  1985    Current Medications: Current Meds  Medication Sig  . acetaminophen (TYLENOL) 500 MG tablet Take 1,000 mg by mouth daily as needed for moderate pain.   [name]. aspirin EC 81 MG tablet Take 81 mg by mouth daily.  [name]. aspirin-sod bicarb-citric acid (ALKA-SELTZER) 325 MG TBEF tablet Take 650 mg by mouth daily as needed (indigestion).  . clopidogrel (PLAVIX) 75 MG tablet Take 1 tablet (75 mg total) by mouth daily.  . diphenhydrAMINE (BENADRYL) 25 MG tablet Take 25 mg by mouth daily as needed (allergic reactions).  . doxycycline (VIBRAMYCIN) 100 MG capsule Take 1 capsule  (100 mg total) by mouth 2 (two) times daily for 10 days.  [name]. EPINEPHrine (EPIPEN 2-PAK) 0.3 mg/0.3 mL SOAJ injection Inject 0.3 mLs (0.3 mg total) into the muscle once as needed (for severe allergic reaction). CAll 911 immediately if you have to use this medicine  . nitroGLYCERIN (NITROSTAT) 0.4 MG SL tablet Place 1 tablet (0.4 mg total) under the tongue every 5 (five) minutes as needed for chest pain.  [name]. oxyCODONE-acetaminophen (PERCOCET/ROXICET) 5-325 MG tablet Take 1 tablet by mouth daily as needed for severe pain.   . polyethylene glycol (MIRALAX / GLYCOLAX) 17 g packet Take 17 g by mouth daily as needed for moderate constipation.  . valACYclovir (VALTREX) 500 MG tablet Take 500 mg by mouth daily as needed (cold sores). Take for 5 days at first sign of outbreak  . verapamil (CALAN) 80 MG tablet Take 1 tablet (80 mg total) by mouth daily as needed.  . verapamil (CALAN-SR) 180 MG CR tablet Take 1 tablet (180 mg total) by mouth daily.     Allergies:   Ciprofloxacin, Iodinated diagnostic agents, Penicillins, Atorvastatin, Clindamycin/lincomycin, Erythromycin, Ibuprofen, Other, Rosuvastatin, Sulfa antibiotics, and Latex   Social History   Socioeconomic History  . Marital status: Single    Spouse name: Not on file  . Number of children: Not on file  . Years of education: Not on file  . Highest education level: Not on file  Occupational History  . Not on file  Social Needs  . Financial resource strain: Not on file  . Food insecurity    Worry: Not on file    Inability: Not on file  . Transportation needs    Medical: Not on file    Non-medical: Not on file  Tobacco Use  . Smoking status: Never Smoker  . Smokeless tobacco: Never Used  Substance and Sexual Activity  . Alcohol use: Yes    Alcohol/week: 1.0 standard drinks    Types: 1 Glasses of wine per week    Comment: once a month  . Drug use: No  . Sexual activity: Not Currently  Lifestyle  . Physical activity    Days per week:  Not on file    Minutes per session: Not on file  . Stress: Not on file  Relationships  . Social Musicianconnections    Talks on phone: Not on file    Gets together: Not on file    Attends religious service: Not on file    Active member of club or organization: Not on file    Attends meetings of clubs or organizations: Not on file    Relationship status: Not on file  Other Topics Concern  . Not on file  Social History Narrative  . Not on file     Family History: The patient's family history includes Arthritis in his mother and paternal grandmother; Colon cancer in his  paternal grandfather; Diabetes in his father, paternal grandfather, and paternal grandmother; Heart disease in his father, mother, and paternal grandfather; Hyperlipidemia in his maternal grandmother and mother; Hypertension in his father, mother, and another family member. There is no history of Esophageal cancer, Rectal cancer, or Stomach cancer. ROS:   Please see the history of present illness.    All other systems reviewed and are negative.  EKGs/Labs/Other Studies Reviewed:    The following studies were reviewed today:    Recent Labs: 08/31/2018: ALT 15; NT-Pro BNP 43 03/16/2019: BUN 10; Creatinine, Ser 0.94; Hemoglobin 15.8; Platelets 225; Potassium 4.3; Sodium 142; TSH 3.320  Recent Lipid Panel    Component Value Date/Time   CHOL 262 (H) 09/04/2014 0926   TRIG 317.0 (H) 09/04/2014 0926   HDL 27.60 (L) 09/04/2014 0926   CHOLHDL 9 09/04/2014 0926   VLDL 63.4 (H) 09/04/2014 0926   LDLCALC 126 (H) 10/01/2013 1407   LDLDIRECT 151.2 09/04/2014 0926    Physical Exam:    VS:  BP 102/70 (BP Location: Right Arm, Patient Position: Sitting, Cuff Size: Large)   Pulse 92   Temp 98.1 F (36.7 C)   Ht 5\' 8"  (1.727 m)   Wt 216 lb 12.8 oz (98.3 kg)   SpO2 96%   BMI 32.96 kg/m     Wt Readings from Last 3 Encounters:  07/06/19 216 lb 12.8 oz (98.3 kg)  07/03/19 217 lb 6.4 oz (98.6 kg)  04/06/19 212 lb 0.6 oz (96.2 kg)      GEN:  Well nourished, well developed in no acute distress HEENT: Normal NECK: No JVD; No carotid bruits LYMPHATICS: No lymphadenopathy CARDIAC: RRR, no murmurs, rubs, gallops RESPIRATORY:  Clear to auscultation without rales, wheezing or rhonchi  ABDOMEN: Soft, non-tender, non-distended MUSCULOSKELETAL:  No edema; No deformity  SKIN: Warm and dry NEUROLOGIC:  Alert and oriented x 3 PSYCHIATRIC:  Normal affect    Signed, Shirlee More, MD  07/06/2019 9:27 AM    [place]

## 2019-07-06 ENCOUNTER — Ambulatory Visit (INDEPENDENT_AMBULATORY_CARE_PROVIDER_SITE_OTHER): Payer: Federal, State, Local not specified - PPO | Admitting: Cardiology

## 2019-07-06 ENCOUNTER — Encounter: Payer: Self-pay | Admitting: Cardiology

## 2019-07-06 ENCOUNTER — Encounter: Payer: Self-pay | Admitting: *Deleted

## 2019-07-06 ENCOUNTER — Other Ambulatory Visit: Payer: Self-pay

## 2019-07-06 VITALS — BP 102/70 | HR 92 | Temp 98.1°F | Ht 68.0 in | Wt 216.8 lb

## 2019-07-06 DIAGNOSIS — I25119 Atherosclerotic heart disease of native coronary artery with unspecified angina pectoris: Secondary | ICD-10-CM | POA: Diagnosis not present

## 2019-07-06 DIAGNOSIS — E785 Hyperlipidemia, unspecified: Secondary | ICD-10-CM | POA: Diagnosis not present

## 2019-07-06 MED ORDER — PRAVASTATIN SODIUM 20 MG PO TABS: 20.0000 mg | ORAL_TABLET | Freq: Every evening | ORAL | 1 refills | Status: DC

## 2019-07-06 NOTE — Patient Instructions (Signed)
Medication Instructions:  Your physician has recommended you make the following change in your medication:   START pravastatin (pravachol) 20 mg: Take 1 tablet daily  If you need a refill on your cardiac medications before your next appointment, please call your pharmacy.   Lab work: Your physician recommends that you return for lab work in 6 weeks: CMP, lipid panel. Please return to our office for lab work, no appointment needed. Please fast beforehand.   If you have labs (blood work) drawn today and your tests are completely normal, you will receive your results only by: [name] MyChart Message (if you have MyChart) OR . A paper copy in the mail If you have any lab test that is abnormal or we need to change your treatment, we will call you to review the results.  Testing/Procedures: None  Follow-Up: At [facility], you and your health needs are our priority.  As part of our continuing mission to provide you with exceptional heart care, we have created designated Provider Care Teams.  These Care Teams include your primary Cardiologist (physician) and Advanced Practice Providers (APPs -  Physician Assistants and Nurse Practitioners) who all work together to provide you with the care you need, when you need it. You will need a follow up appointment in 6 months.  Please call our office 2 months in advance to schedule this appointment.      Pravastatin tablets What is this medicine? PRAVASTATIN (PRA va stat in) is known as a HMG-CoA reductase inhibitor or 'statin'. It lowers the level of cholesterol and triglycerides in the blood. This drug may also reduce the risk of heart attack, stroke, or other health problems in patients with risk factors for heart disease. Diet and lifestyle changes are often used with this drug. This medicine may be used for other purposes; ask your health care provider or pharmacist if you have questions. COMMON BRAND NAME(S): Pravachol What should I tell my health  care provider before I take this medicine? They need to know if you have any of these conditions:  diabetes  if you often drink alcohol  history of stroke  kidney disease  liver disease  muscle aches or weakness  thyroid disease  an unusual or allergic reaction to pravastatin, other medicines, foods, dyes, or preservatives  pregnant or trying to get pregnant  breast-feeding How should I use this medicine? Take pravastatin tablets by mouth. Swallow the tablets with a drink of water. Pravastatin can be taken at anytime of the day, with or without food. Follow the directions on the prescription label. Take your doses at regular intervals. Do not take your medicine more often than directed. Talk to your pediatrician regarding the use of this medicine in children. Special care may be needed. Pravastatin has been used in children as young as 97 years of age. Overdosage: If you think you have taken too much of this medicine contact a poison control center or emergency room at once. NOTE: This medicine is only for you. Do not share this medicine with others. What if I miss a dose? If you miss a dose, take it as soon as you can. If it is almost time for your next dose, take only that dose. Do not take double or extra doses. What may interact with this medicine? This medicine may interact with the following medications:  colchicine  cyclosporine  other medicines for high cholesterol  some antibiotics like azithromycin, clarithromycin, erythromycin, and telithromycin This list may not describe all possible  interactions. Give your health care provider a list of all the medicines, herbs, non-prescription drugs, or dietary supplements you use. Also tell them if you smoke, drink alcohol, or use illegal drugs. Some items may interact with your medicine. What should I watch for while using this medicine? Visit your doctor or health care professional for regular check-ups. You may need regular  tests to make sure your liver is working properly. Your health care professional may tell you to stop taking this medicine if you develop muscle problems. If your muscle problems do not go away after stopping this medicine, contact your health care professional. Do not become pregnant while taking this medicine. Women should inform their health care professional if they wish to become pregnant or think they might be pregnant. There is a potential for serious side effects to an unborn child. Talk to your health care professional or pharmacist for more information. Do not breast-feed an infant while taking this medicine. This medicine may affect blood sugar levels. If you have diabetes, check with your doctor or health care professional before you change your diet or the dose of your diabetic medicine. If you are going to need surgery or other procedure, tell your doctor that you are using this medicine. This drug is only part of a total heart-health program. Your doctor or a dietician can suggest a low-cholesterol and low-fat diet to help. Avoid alcohol and smoking, and keep a proper exercise schedule. This medicine may cause a decrease in Co-Enzyme Q-10. You should make sure that you get enough Co-Enzyme Q-10 while you are taking this medicine. Discuss the foods you eat and the vitamins you take with your health care professional. What side effects may I notice from receiving this medicine? Side effects that you should report to your doctor or health care professional as soon as possible:  allergic reactions like skin rash, itching or hives, swelling of the face, lips, or tongue  dark urine  fever  muscle pain, cramps, or weakness  redness, blistering, peeling or loosening of the skin, including inside the mouth  trouble passing urine or change in the amount of urine  unusually weak or tired  yellowing of the eyes or skin Side effects that usually do not require medical attention (report to  your doctor or health care professional if they continue or are bothersome):  gas  headache  heartburn  indigestion  stomach pain This list may not describe all possible side effects. Call your doctor for medical advice about side effects. You may report side effects to FDA at 1-800-FDA-1088. Where should I keep my medicine? Keep out of the reach of children. Store at room temperature between 15 to 30 degrees C (59 to 86 degrees F). Protect from light. Keep container tightly closed. Throw away any unused medicine after the expiration date. NOTE: This sheet is a summary. It may not cover all possible information. If you have questions about this medicine, talk to your doctor, pharmacist, or health care provider.  2020 [employer] Standard (2017-06-14 12:37:09)

## 2019-08-15 ENCOUNTER — Telehealth: Payer: Self-pay | Admitting: Cardiology

## 2019-08-15 NOTE — Telephone Encounter (Signed)
Spoke with pt, he is very upset because no one in dr [facility] office called him back today. He was on hold and then got a message that the office was now closed. He is concerned because he has an infection in his gum that has been there for 3 months now and the endodontist said he needed to let his cardiologist know since he had a stent in may. The patient was offered an appointment with the NP in [place] when dr Bettina Gavia is there but he declined and ask if someone can let dr Bettina Gavia know and someone call him back tomorrow. Will forward to dr Bettina Gavia to review and advise.

## 2019-08-15 NOTE — Telephone Encounter (Signed)
Patient called stating he some sores in his mouth, he dentist suggested he talk to his cardiologist about them saying it could be a risk for him since he recently had a stent put in.  Please call patient to discuss

## 2019-08-15 NOTE — Telephone Encounter (Signed)
I do not see any connection with stent or additional risk of oral sores. Advise to see his PCP.

## 2019-08-16 NOTE — Telephone Encounter (Signed)
Called patient who states he spoke briefly with Dr. Bettina Gavia first thing this morning who answered all of his questions. Patient has no further questions or concerns at this time.

## 2019-08-31 DIAGNOSIS — K080 Exfoliation of teeth due to systemic causes: Secondary | ICD-10-CM | POA: Diagnosis not present

## 2019-10-05 ENCOUNTER — Emergency Department (HOSPITAL_BASED_OUTPATIENT_CLINIC_OR_DEPARTMENT_OTHER)
Admission: EM | Admit: 2019-10-05 | Discharge: 2019-10-05 | Disposition: A | Discharge status: Home / Self Care | Payer: Federal, State, Local not specified - PPO | Attending: Emergency Medicine | Admitting: Emergency Medicine

## 2019-10-05 ENCOUNTER — Other Ambulatory Visit: Payer: Self-pay

## 2019-10-05 ENCOUNTER — Emergency Department (HOSPITAL_BASED_OUTPATIENT_CLINIC_OR_DEPARTMENT_OTHER): Payer: Federal, State, Local not specified - PPO

## 2019-10-05 ENCOUNTER — Ambulatory Visit: Payer: Self-pay | Admitting: Family Medicine

## 2019-10-05 ENCOUNTER — Encounter (HOSPITAL_BASED_OUTPATIENT_CLINIC_OR_DEPARTMENT_OTHER): Payer: Self-pay | Admitting: *Deleted

## 2019-10-05 DIAGNOSIS — Z7982 Long term (current) use of aspirin: Secondary | ICD-10-CM | POA: Insufficient documentation

## 2019-10-05 DIAGNOSIS — R1031 Right lower quadrant pain: Secondary | ICD-10-CM | POA: Diagnosis not present

## 2019-10-05 DIAGNOSIS — R109 Unspecified abdominal pain: Secondary | ICD-10-CM | POA: Diagnosis not present

## 2019-10-05 DIAGNOSIS — J45909 Unspecified asthma, uncomplicated: Secondary | ICD-10-CM | POA: Diagnosis not present

## 2019-10-05 DIAGNOSIS — Z79899 Other long term (current) drug therapy: Secondary | ICD-10-CM | POA: Diagnosis not present

## 2019-10-05 DIAGNOSIS — I2510 Atherosclerotic heart disease of native coronary artery without angina pectoris: Secondary | ICD-10-CM | POA: Insufficient documentation

## 2019-10-05 DIAGNOSIS — Z91040 Latex allergy status: Secondary | ICD-10-CM | POA: Diagnosis not present

## 2019-10-05 LAB — CBC WITH DIFFERENTIAL/PLATELET
Abs Immature Granulocytes: 0.02 10*3/uL (ref 0.00–0.07)
Basophils Absolute: 0 10*3/uL (ref 0.0–0.1)
Basophils Relative: 0 %
Eosinophils Absolute: 0.1 10*3/uL (ref 0.0–0.5)
Eosinophils Relative: 1 %
HCT: 44.3 % (ref 39.0–52.0)
Hemoglobin: 15.2 g/dL (ref 13.0–17.0)
Immature Granulocytes: 0 %
Lymphocytes Relative: 16 %
Lymphs Abs: 1.3 10*3/uL (ref 0.7–4.0)
MCH: 31.3 pg (ref 26.0–34.0)
MCHC: 34.3 g/dL (ref 30.0–36.0)
MCV: 91.2 fL (ref 80.0–100.0)
Monocytes Absolute: 0.6 10*3/uL (ref 0.1–1.0)
Monocytes Relative: 7 %
Neutro Abs: 6.3 10*3/uL (ref 1.7–7.7)
Neutrophils Relative %: 76 %
Platelets: 206 10*3/uL (ref 150–400)
RBC: 4.86 MIL/uL (ref 4.22–5.81)
RDW: 12.6 % (ref 11.5–15.5)
WBC: 8.3 10*3/uL (ref 4.0–10.5)
nRBC: 0 % (ref 0.0–0.2)

## 2019-10-05 LAB — URINALYSIS, ROUTINE W REFLEX MICROSCOPIC
Bilirubin Urine: NEGATIVE
Glucose, UA: NEGATIVE mg/dL
Ketones, ur: NEGATIVE mg/dL
Leukocytes,Ua: NEGATIVE
Nitrite: NEGATIVE
Protein, ur: NEGATIVE mg/dL
Specific Gravity, Urine: 1.02 (ref 1.005–1.030)
pH: 6 (ref 5.0–8.0)

## 2019-10-05 LAB — COMPREHENSIVE METABOLIC PANEL
ALT: 18 U/L (ref 0–44)
AST: 21 U/L (ref 15–41)
Albumin: 4.3 g/dL (ref 3.5–5.0)
Alkaline Phosphatase: 69 U/L (ref 38–126)
Anion gap: 9 (ref 5–15)
BUN: 13 mg/dL (ref 8–23)
CO2: 23 mmol/L (ref 22–32)
Calcium: 9.2 mg/dL (ref 8.9–10.3)
Chloride: 106 mmol/L (ref 98–111)
Creatinine, Ser: 0.83 mg/dL (ref 0.61–1.24)
GFR calc Af Amer: >60 mL/min (ref >60)
GFR calc non Af Amer: >60 mL/min (ref >60)
Glucose, Bld: 118 mg/dL — ABNORMAL HIGH (ref 70–99)
Potassium: 3.5 mmol/L (ref 3.5–5.1)
Sodium: 138 mmol/L (ref 135–145)
Total Bilirubin: 0.8 mg/dL (ref 0.3–1.2)
Total Protein: 7 g/dL (ref 6.5–8.1)

## 2019-10-05 LAB — URINALYSIS, MICROSCOPIC (REFLEX)

## 2019-10-05 LAB — LIPASE, BLOOD: Lipase: 40 U/L (ref 11–51)

## 2019-10-05 NOTE — Telephone Encounter (Signed)
FYI

## 2019-10-05 NOTE — Telephone Encounter (Signed)
Noted. FYI 

## 2019-10-05 NOTE — Discharge Instructions (Signed)
You are seen today for right lower quadrant pain.  Our work-up revealed a normal complete blood count, normal metabolic panel and a CT scan showing no colitis, appendicitis or obstructing kidney stone.  Your urine had trace amounts of red blood cells and this should be followed up by your primary care doctor.  If you have any new or worsening symptoms you may return to the emergency department for reevaluation.  Otherwise follow-up with your primary care doctor.  Thank you for allowing me to care for you today

## 2019-10-05 NOTE — Telephone Encounter (Signed)
Pt reports "Sharp shooting pain" "A little right of center, below navel."  Intermittent, onset this AM. Also reports dull "Soreness" at right hip area and lower back. Denies fever, no nausea, LBM this AM. Denies any dysuria, no blood noted in urine. States "Pain doubles me over when it hits."  H/O kidney stones "30 years ago." Pt directed to ED, states will follow disposition.  Reason for Disposition . [1] SEVERE pain AND [2] age > 63  Answer Assessment - Initial Assessment Questions 1. LOCATION: "Where does it hurt?"      Lower right side, just right of center 2. RADIATION: "Does the pain shoot anywhere else?" (e.g., chest, back)     Dull pain right hip and lower back 3. ONSET: "When did the pain begin?" (Minutes, hours or days ago)      This am 4. SUDDEN: "Gradual or sudden onset?"    sudden 5. PATTERN "Does the pain come and go, or is it constant?"    - If constant: "Is it getting better, staying the same, or worsening?"      (Note: Constant means the pain never goes away completely; most serious pain is constant and it progresses)     - If intermittent: "How long does it last?" "Do you have pain now?"     (Note: Intermittent means the pain goes away completely between bouts)     Shooting, stabbing pain 6. SEVERITY: "How bad is the pain?"  (e.g., Scale 1-10; mild, moderate, or severe)    - MILD (1-3): doesn't interfere with normal activities, abdomen soft and not tender to touch     - MODERATE (4-7): interferes with normal activities or awakens from sleep, tender to touch     - SEVERE (8-10): excruciating pain, doubled over, unable to do any normal activities      Severe, "Doubles over." 7. RECURRENT SYMPTOM: "Have you ever had this type of abdominal pain before?" If so, ask: "When was the last time?" and "What happened that time?"      Yes but worse, with kidney stones 30 years ago 53. CAUSE: "What do you think is causing the abdominal pain?"    Unsure 9. RELIEVING/AGGRAVATING  FACTORS: "What makes it better or worse?" (e.g., movement, antacids, bowel movement)      10. OTHER SYMPTOMS: "Has there been any vomiting, diarrhea, constipation, or urine problems?"     Constipation, LBM 10/05/2019  Protocols used: ABDOMINAL PAIN - MALE-A-AH

## 2019-10-05 NOTE — ED Provider Notes (Signed)
[facility] EMERGENCY DEPARTMENT Provider Note   CSN: [id number] Arrival date & time: 10/05/19  1344     History Chief Complaint  Patient presents with  . Abdominal Pain    Edward Curtis is a 63 y.o. male.  Patient is a 63 year old gentleman with past medical history of IBS, asthma, arthritis presenting to the emergency department for acute onset of waxing and waning right lower quadrant pain which began this morning.  Reports that the pain is located in the right lower quadrant and follows the belt line up into the right flank area.  He does have a remote history of a kidney stone 30 years ago.  Denies any hematuria, dysuria or difficulty urinating.  Last bowel movement was this morning and he notes that he has been somewhat constipated.  Denies any fever, nausea, vomiting.  Currently not having the pain but reports that it will randomly flareup and cause him to bend over and pain.        Past Medical History:  Diagnosis Date  . Allergy    seasonal allergies   . Arthritis   . Asthma   . Blood transfusion without reported diagnosis 1985  . Calcium oxalate renal stones    early 1990's  . Chicken pox   . Chronic neck pain   . Depression   . GERD (gastroesophageal reflux disease)   . Hyperlipidemia 10/10/2013  . Hypertrophic obstructive cardiomyopathy(425.11) 10/10/2013  . IBS (irritable bowel syndrome) 12/07/2011  . Migraines   . Neuromuscular disorder (HCC)    radiculopathy left side   . Obesity (BMI 30-39.9) 10/10/2013  . Seasonal allergies     Patient Active Problem List   Diagnosis Date Noted  . Coronary artery disease involving native coronary artery of native heart with angina pectoris (HCC) 03/30/2019  . Angina, class III (HCC) 03/16/2019    Class: Question of  . Allergy to IVP dye 03/16/2019  . Chest discomfort 03/08/2019  . Edema 08/31/2018  . Hypogonadism in male 11/25/2017  . Scabies 08/20/2017  . Nasal abrasion 08/20/2017  . Anxiety and  depression 11/12/2013  . Prediabetes 11/12/2013  . Obesity (BMI 30-39.9) 10/10/2013  . Hyperlipidemia 10/10/2013  . Hypertrophic cardiomyopathy (HCC)   . IBS (irritable bowel syndrome) 12/07/2011  . HIstory of SVT   . Chronic back pain     Past Surgical History:  Procedure Laterality Date  . CORONARY STENT INTERVENTION N/A 03/23/2019   Procedure: CORONARY STENT INTERVENTION;  Surgeon: Marykay Lex, MD;  Location: [facility] INVASIVE CV LAB;  Service: Cardiovascular;  Laterality: N/A;  . LEFT HEART CATH AND CORONARY ANGIOGRAPHY N/A 03/23/2019   Procedure: LEFT HEART CATH AND CORONARY ANGIOGRAPHY;  Surgeon: Marykay Lex, MD;  Location: [facility] INVASIVE CV LAB;  Service: Cardiovascular;  Laterality: N/A;  . left knee surgery  1990  . MOUTH SURGERY  1975/1993  . pre cancerous mole  2013  . prostate procedure  1980  . umbilical cyst  1985       Family History  Problem Relation Age of Onset  . Arthritis Mother   . Hyperlipidemia Mother   . Heart disease Mother   . Hypertension Mother   . Heart disease Father   . Hypertension Father   . Diabetes Father   . Arthritis Paternal Grandmother   . Diabetes Paternal Grandmother   . Colon cancer Paternal Grandfather   . Heart disease Paternal Grandfather   . Diabetes Paternal Grandfather   . Hyperlipidemia Maternal Grandmother   .  Hypertension Other        both sets of parents  . Esophageal cancer Neg Hx   . Rectal cancer Neg Hx   . Stomach cancer Neg Hx     Social History   Tobacco Use  . Smoking status: Never Smoker  . Smokeless tobacco: Never Used  Substance Use Topics  . Alcohol use: Yes    Alcohol/week: 1.0 standard drinks    Types: 1 Glasses of wine per week    Comment: once a month  . Drug use: No    Home Medications Prior to Admission medications   Medication Sig Start Date End Date Taking? Authorizing Provider  acetaminophen (TYLENOL) 500 MG tablet Take 1,000 mg by mouth daily as needed for moderate pain.      [provider]  aspirin EC 81 MG tablet Take 81 mg by mouth daily.    [provider]  aspirin-sod bicarb-citric acid (ALKA-SELTZER) 325 MG TBEF tablet Take 650 mg by mouth daily as needed (indigestion).    [provider]  clopidogrel (PLAVIX) 75 MG tablet Take 1 tablet (75 mg total) by mouth daily. 03/30/19   Richardo Priest, MD  diphenhydrAMINE (BENADRYL) 25 MG tablet Take 25 mg by mouth daily as needed (allergic reactions).    [provider]  EPINEPHrine (EPIPEN 2-PAK) 0.3 mg/0.3 mL SOAJ injection Inject 0.3 mLs (0.3 mg total) into the muscle once as needed (for severe allergic reaction). CAll 911 immediately if you have to use this medicine 10/12/13   Noemi Chapel, MD  nitroGLYCERIN (NITROSTAT) 0.4 MG SL tablet Place 1 tablet (0.4 mg total) under the tongue every 5 (five) minutes as needed for chest pain. 03/08/19 07/05/22  Revankar, Reita Cliche, MD  oxyCODONE-acetaminophen (PERCOCET/ROXICET) 5-325 MG tablet Take 1 tablet by mouth daily as needed for severe pain.     [provider]  polyethylene glycol (MIRALAX / GLYCOLAX) 17 g packet Take 17 g by mouth daily as needed for moderate constipation.    [provider]  pravastatin (PRAVACHOL) 20 MG tablet Take 1 tablet (20 mg total) by mouth every evening. 07/06/19 10/04/19  Richardo Priest, MD  valACYclovir (VALTREX) 500 MG tablet Take 500 mg by mouth daily as needed (cold sores). Take for 5 days at first sign of outbreak    [provider]  verapamil (CALAN) 80 MG tablet Take 1 tablet (80 mg total) by mouth daily as needed. 11/17/18   Richardo Priest, MD  verapamil (CALAN-SR) 180 MG CR tablet Take 1 tablet (180 mg total) by mouth daily. 11/17/18   Richardo Priest, MD    Allergies    Ciprofloxacin, Iodinated diagnostic agents, Penicillins, Atorvastatin, Clindamycin/lincomycin, Erythromycin, Ibuprofen, Keflex [cephalexin], Other, Rosuvastatin, Sulfa antibiotics, and Latex  Review of Systems     Review of Systems  Constitutional: Negative for appetite change, chills and fever.  HENT: Negative for congestion, sneezing and sore throat.   Respiratory: Negative for cough and shortness of breath.   Cardiovascular: Negative for chest pain.  Gastrointestinal: Positive for abdominal pain and constipation. Negative for abdominal distention, anal bleeding, blood in stool, diarrhea, nausea and vomiting.  Genitourinary: Positive for flank pain. Negative for decreased urine volume, discharge, dysuria, hematuria, penile pain, scrotal swelling, testicular pain and urgency.  Musculoskeletal: Negative for back pain.  Skin: Negative for rash.  Neurological: Negative for dizziness.    Physical Exam Updated Vital Signs BP 130/67 (BP Location: Right Arm)   Pulse 74   Temp 98.4 F (  36.9 C) (Oral)   Resp 16   Ht 5\' 8"  (1.727 m)   Wt 95.3 kg   SpO2 96%   BMI 31.93 kg/m   Physical Exam Vitals and nursing note reviewed.  Constitutional:      Appearance: Normal appearance.  HENT:     Head: Normocephalic.  Eyes:     Conjunctiva/sclera: Conjunctivae normal.  Cardiovascular:     Rate and Rhythm: Normal rate and regular rhythm.  Pulmonary:     Effort: Pulmonary effort is normal.  Abdominal:     General: Abdomen is flat. Bowel sounds are normal. There is no distension.     Palpations: Abdomen is soft.     Tenderness: There is abdominal tenderness in the right lower quadrant. There is no guarding or rebound.  Skin:    General: Skin is dry.  Neurological:     Mental Status: He is alert.  Psychiatric:        Mood and Affect: Mood normal.     ED Results / Procedures / Treatments   Labs (all labs ordered are listed, but only abnormal results are displayed) Labs Reviewed  URINALYSIS, ROUTINE W REFLEX MICROSCOPIC - Abnormal; Notable for the following components:      Result Value   Hgb urine dipstick TRACE (*)    All other components within normal limits  COMPREHENSIVE METABOLIC PANEL -  Abnormal; Notable for the following components:   Glucose, Bld 118 (*)    All other components within normal limits  URINALYSIS, MICROSCOPIC (REFLEX) - Abnormal; Notable for the following components:   Bacteria, UA RARE (*)    All other components within normal limits  CBC WITH DIFFERENTIAL/PLATELET  LIPASE, BLOOD    EKG None  Radiology CT Renal Stone Study  Result Date: 10/05/2019 CLINICAL DATA:  63 year old male with history of right lower quadrant and right flank pain since this morning. Remote history of kidney stones. EXAM: CT ABDOMEN AND PELVIS WITHOUT CONTRAST TECHNIQUE: Multidetector CT imaging of the abdomen and pelvis was performed following the standard protocol without IV contrast. COMPARISON:  No priors. FINDINGS: Lower chest: Aortic atherosclerosis.  Small hiatal hernia. Hepatobiliary: Mild diffuse low attenuation throughout the hepatic parenchyma, indicative of hepatic steatosis. No discrete cystic or solid hepatic lesions are confidently identified on today's noncontrast CT examination. Unenhanced appearance of the gallbladder is normal. Pancreas: No definite pancreatic mass or peripancreatic fluid collections or inflammatory changes noted on today's noncontrast CT examination. Spleen: Unremarkable. Adrenals/Urinary Tract: Small nonobstructive calculi in the upper pole collecting system of left kidney measuring up to 5 mm. No additional calculi are noted within the collecting system of the right kidney, along the course of either ureter, or within the lumen of the urinary bladder. No hydroureteronephrosis. Low-attenuation lesions in the right kidney, incompletely characterized on today's non-contrast CT examination, but measuring up to 2 cm in the interpolar region, statistically likely to represent cysts. Bilateral adrenal glands are normal in appearance. Urinary bladder is normal in appearance. Stomach/Bowel: Normal appearance of the stomach. No pathologic dilatation of small bowel  or colon. Numerous colonic diverticulae are noted, particularly in the descending colon and sigmoid colon, without surrounding inflammatory changes to suggest an acute diverticulitis at this time. Normal appendix. Vascular/Lymphatic: Aortic atherosclerosis. No lymphadenopathy noted in the abdomen or pelvis. Reproductive: Prostate gland and seminal vesicles are unremarkable in appearance. Other: No significant volume of ascites.  No pneumoperitoneum. Musculoskeletal: There are no aggressive appearing lytic or blastic lesions noted in the visualized portions of the  skeleton. IMPRESSION: 1. No acute findings are noted in the abdomen or pelvis to account for the patient's symptoms. 2. Nonobstructive calculi in the upper pole collecting system of the left kidney measuring up to 5 mm. No ureteral stones or findings of urinary tract obstruction are noted at this time. 3. Normal appendix. 4. Colonic diverticulosis without evidence of acute diverticulitis at this time. 5. Aortic atherosclerosis. Electronically Signed   By: Trudie Reed M.D.   On: 10/05/2019 14:47    Procedures Procedures (including critical care time)  Medications Ordered in ED Medications - No data to display  ED Course  I have reviewed the triage vital signs and the nursing notes.  Pertinent labs & imaging results that were available during my care of the patient were reviewed by me and considered in my medical decision making (see chart for details).  Clinical Course as of Oct 04 1522  Fri Oct 05, 2019  1521 Patient was seen for intermittent right lower quadrant pain today.  Work-up revealed some hemoglobin in his urinalysis but otherwise negative CT scan, CBC, metabolic panel.  Patient appeared well on exam and did not have any pain while I was examining him.  Advised that he should follow-up with his primary care doctor for hematuria for continued work-up and possible urology referral.  Otherwise he was advised on strict return  precautions emergency department.  Patient has Percocet at home he can take if he has any further flareups.   [KM]    Clinical Course User Index [KM] Jeral Pinch   MDM Rules/Calculators/A&P     CHA2DS2/VAS Stroke Risk Points      N/A >= 2 Points: High Risk  1 - 1.99 Points: Medium Risk  0 Points: Low Risk    A final score could not be computed because of missing components.: Last  Change: N/A     This score determines the patient's risk of having a stroke if the  patient has atrial fibrillation.      This score is not applicable to this patient. Components are not  calculated.                  Based on review of vitals, medical screening exam, lab work and/or imaging, there does not appear to be an acute, emergent etiology for the patient's symptoms. Counseled pt on good return precautions and encouraged both PCP and ED follow-up as needed.  Prior to discharge, I also discussed incidental imaging findings with patient in detail and advised appropriate, recommended follow-up in detail.  Clinical Impression: 1. Right lower quadrant abdominal pain     Disposition: Discharge  Prior to providing a prescription for a controlled substance, I independently reviewed the patient's recent prescription history on the [place] Controlled Substance Reporting System. The patient had no recent or regular prescriptions and was deemed appropriate for a brief, less than 3 day prescription of narcotic for acute analgesia.  This note was prepared with assistance of Conservation officer, historic buildings. Occasional wrong-word or sound-a-like substitutions may have occurred due to the inherent limitations of voice recognition software.  Final Clinical Impression(s) / ED Diagnoses Final diagnoses:  Right lower quadrant abdominal pain    Rx / DC Orders ED Discharge Orders    None       Jeral Pinch 10/05/19 1523    Jacalyn Lefevre, MD 10/06/19 [phone]

## 2019-10-05 NOTE — ED Triage Notes (Signed)
Right lower quadrant sharp pain since this am. No diarrhea or vomiting.

## 2019-11-06 ENCOUNTER — Other Ambulatory Visit: Payer: Self-pay | Admitting: Cardiology

## 2019-11-20 ENCOUNTER — Ambulatory Visit: Payer: Federal, State, Local not specified - PPO | Admitting: Family Medicine

## 2019-11-20 ENCOUNTER — Encounter: Payer: Self-pay | Admitting: Family Medicine

## 2019-11-20 ENCOUNTER — Other Ambulatory Visit: Payer: Self-pay

## 2019-11-20 VITALS — BP 110/60 | HR 93 | Temp 97.8°F | Wt 217.2 lb

## 2019-11-20 DIAGNOSIS — S39011D Strain of muscle, fascia and tendon of abdomen, subsequent encounter: Secondary | ICD-10-CM | POA: Diagnosis not present

## 2019-11-20 DIAGNOSIS — K115 Sialolithiasis: Secondary | ICD-10-CM

## 2019-11-20 NOTE — Progress Notes (Signed)
   Subjective:    Patient ID: Edward Curtis, male    DOB: 1956-09-04, 64 y.o.   MRN: [id number]  HPI Here for several issues. First he was in the ER on 12-06-19 for RLQ abdominal pain. No NVD or fever. There was concern about possible appenddicitis, but this was ruled out. Labs were unremarkable, and a CT of the abdomen and pelvis showed no acute problems. No explanation for the pain was given and he was told to follow up with [place] as needed. Since then the pain has decreased quite a bit, but he still has some mild discomfort in the area. Of note he had been moving heavy furniture around his apartment just before this pain began. In addition he has new issue that started 3 days ago. He developed a sudden swelling and pain in the left neck just below the jaw bone. No URI symptoms or fever. No ST or pain with chewing. Today the swelling and pain have decreased a fair amount.    Review of Systems  Constitutional: Negative.   HENT: Negative for congestion, dental problem, postnasal drip, sinus pressure, sinus pain, sore throat, tinnitus, trouble swallowing and voice change.   Eyes: Negative.   Respiratory: Negative.   Cardiovascular: Negative.   Gastrointestinal: Positive for abdominal pain. Negative for abdominal distention, anal bleeding, blood in stool, constipation, diarrhea, nausea, rectal pain and vomiting.  Genitourinary: Negative.        Objective:   Physical Exam Constitutional:      Appearance: Normal appearance. He is not ill-appearing.  HENT:     Head: Normocephalic and atraumatic.     Right Ear: Tympanic membrane, ear canal and external ear normal.     Left Ear: Tympanic membrane, ear canal and external ear normal.     Nose: Nose normal.     Mouth/Throat:     Pharynx: Oropharynx is clear.  Eyes:     Conjunctiva/sclera: Conjunctivae normal.  Neck:     Comments: There is mild swelling just below the left mandible at the mandibular angle. There is a fullness and tenderness here as  well.  Cardiovascular:     Rate and Rhythm: Normal rate and regular rhythm.     Pulses: Normal pulses.     Heart sounds: Normal heart sounds.  Pulmonary:     Effort: Pulmonary effort is normal.     Breath sounds: Normal breath sounds.  Abdominal:     General: Abdomen is flat. Bowel sounds are normal. There is no distension.     Palpations: Abdomen is soft. There is no mass.     Tenderness: There is no guarding or rebound.     Hernia: No hernia is present.     Comments: Mildly tender in the RLQ just superior to the inguinal ligament   Musculoskeletal:     Cervical back: Neck supple. No rigidity.  Neurological:     Mental Status: He is alert.           Assessment & Plan:  The abdominal pain is likely from a muscular strain, which is related to the heavy lifting he had done. This will continue to heal over the next few weeks. He can take Ibuprofen if needed. As for the neck swelling, he likely had a stone in a salivary gland which was blocking the duct. This appears to be resolving as expected. He can apply hot compresses to the area and he will suck on lemon drops. Recheck prn.  Gershon Crane, MD

## 2019-11-21 DIAGNOSIS — R8271 Bacteriuria: Secondary | ICD-10-CM | POA: Diagnosis not present

## 2019-11-21 DIAGNOSIS — R351 Nocturia: Secondary | ICD-10-CM | POA: Diagnosis not present

## 2019-11-21 DIAGNOSIS — R310 Gross hematuria: Secondary | ICD-10-CM | POA: Diagnosis not present

## 2019-11-23 DIAGNOSIS — N200 Calculus of kidney: Secondary | ICD-10-CM | POA: Diagnosis not present

## 2019-11-23 DIAGNOSIS — R310 Gross hematuria: Secondary | ICD-10-CM | POA: Diagnosis not present

## 2019-11-29 DIAGNOSIS — R310 Gross hematuria: Secondary | ICD-10-CM | POA: Diagnosis not present

## 2019-11-29 DIAGNOSIS — N200 Calculus of kidney: Secondary | ICD-10-CM | POA: Diagnosis not present

## 2020-01-01 ENCOUNTER — Ambulatory Visit: Payer: Federal, State, Local not specified - PPO | Admitting: Cardiology

## 2020-01-01 NOTE — Progress Notes (Signed)
Cardiology Office Note:    Date:  01/02/2020   ID:  Kenroy Timberman, DOB 09-Nov-1955, MRN [id number]  PCP:  [name], MD  Cardiologist:  Norman Herrlich, MD    Referring MD: [name], MD    ASSESSMENT:    1. Coronary artery disease involving native coronary artery of native heart without angina pectoris   2. Hyperlipidemia, unspecified hyperlipidemia type    PLAN:    In order of problems listed above:  1. Stable CAD when he approaches 1 year from PCI 03/23/1999 21-week of alendronate aspirin and remain on clopidogrel.  At this time New York Heart Association class I and after discussion we do not think he requires an ischemia evaluation 2. Poorly controlled statin intolerant we will initiate Zetia 6 weeks check lipid profile and if LDL is greater than 100 will advise PCSK9 inhibitor 3. Stable EKG pattern incomplete right bundle branch block   Next appointment: 1 year   Medication Adjustments/Labs and Tests Ordered: Current medicines are reviewed at length with the patient today.  Concerns regarding medicines are outlined above.  Orders Placed This Encounter  Procedures  . Comprehensive metabolic panel  . Lipid panel  . EKG 12-Lead   Meds ordered this encounter  Medications  . ezetimibe (ZETIA) 10 MG tablet    Sig: Take 1 tablet (10 mg total) by mouth daily.    Dispense:  90 tablet    Refill:  3    Chief Complaint  Patient presents with  . Follow-up  . Coronary Artery Disease    History of Present Illness:    Marlen Mollica is a 64 y.o. male with a hx of CAD and  PCI of LAD 03/23/19  last seen 07/06/2019. Compliance with diet, lifestyle and medications: Yes  He has been intolerant to both high intensity statin atorvastatin low intensity pravastatin with intolerable muscle symptoms.  He understands the need for lipid-lowering treatment asked about over-the-counter fish oil I told him I do not think is adequate will reduce try Zetia 10 mg daily and recheck his  lipids in 6 weeks.  If additional therapy is needed PCSK9 inhibitor would be appropriate.  Otherwise has done well but has gained weight with COVID-19 and has become sedentary..  He has had no angina dyspnea palpitation or syncope his EKG shows a stable pattern of right bundle branch block incomplete and right axis deviation similar to June 2020 Past Medical History:  Diagnosis Date  . Allergy    seasonal allergies   . Arthritis   . Asthma   . Blood transfusion without reported diagnosis 1985  . Calcium oxalate renal stones    early 1990's  . Chicken pox   . Chronic neck pain   . Depression   . GERD (gastroesophageal reflux disease)   . Hyperlipidemia 10/10/2013  . Hypertrophic obstructive cardiomyopathy(425.11) 10/10/2013  . IBS (irritable bowel syndrome) 12/07/2011  . Migraines   . Neuromuscular disorder (HCC)    radiculopathy left side   . Obesity (BMI 30-39.9) 10/10/2013  . Seasonal allergies     Past Surgical History:  Procedure Laterality Date  . CORONARY STENT INTERVENTION N/A 03/23/2019   Procedure: CORONARY STENT INTERVENTION;  Surgeon: Marykay Lex, MD;  Location: [facility] INVASIVE CV LAB;  Service: Cardiovascular;  Laterality: N/A;  . LEFT HEART CATH AND CORONARY ANGIOGRAPHY N/A 03/23/2019   Procedure: LEFT HEART CATH AND CORONARY ANGIOGRAPHY;  Surgeon: Marykay Lex, MD;  Location: [facility] INVASIVE CV LAB;  Service: Cardiovascular;  Laterality: N/A;  . left knee surgery  1990  . MOUTH SURGERY  1975/1993  . pre cancerous mole  2013  . prostate procedure  1980  . umbilical cyst  [id number]    Current Medications: Current Meds  Medication Sig  . acetaminophen (TYLENOL) 500 MG tablet Take 1,000 mg by mouth daily as needed for moderate pain.   [name] aspirin EC 81 MG tablet Take 81 mg by mouth daily.  . clopidogrel (PLAVIX) 75 MG tablet Take 1 tablet (75 mg total) by mouth daily.  . diphenhydrAMINE (BENADRYL) 25 MG tablet Take 25 mg by mouth daily as needed (allergic reactions).  .  EPINEPHrine (EPIPEN 2-PAK) 0.3 mg/0.3 mL SOAJ injection Inject 0.3 mLs (0.3 mg total) into the muscle once as needed (for severe allergic reaction). CAll 911 immediately if you have to use this medicine  . nitroGLYCERIN (NITROSTAT) 0.4 MG SL tablet Place 1 tablet (0.4 mg total) under the tongue every 5 (five) minutes as needed for chest pain.  [name] oxyCODONE-acetaminophen (PERCOCET/ROXICET) 5-325 MG tablet Take 1 tablet by mouth daily as needed for severe pain.   . polyethylene glycol (MIRALAX / GLYCOLAX) 17 g packet Take 17 g by mouth daily as needed for moderate constipation.  . valACYclovir (VALTREX) 500 MG tablet Take 500 mg by mouth daily as needed (cold sores). Take for 5 days at first sign of outbreak  . verapamil (CALAN) 80 MG tablet Take 1 tablet (80 mg total) by mouth daily as needed.  . verapamil (CALAN-SR) 180 MG CR tablet TAKE ONE TABLET DAILY     Allergies:   Ciprofloxacin, Iodinated diagnostic agents, Penicillins, Atorvastatin, Clindamycin/lincomycin, Erythromycin, Ibuprofen, Keflex [cephalexin], Other, Rosuvastatin, Sulfa antibiotics, and Latex   Social History   Socioeconomic History  . Marital status: Single    Spouse name: Not on file  . Number of children: Not on file  . Years of education: Not on file  . Highest education level: Not on file  Occupational History  . Not on file  Tobacco Use  . Smoking status: Never Smoker  . Smokeless tobacco: Never Used  Substance and Sexual Activity  . Alcohol use: Yes    Alcohol/week: 1.0 standard drinks    Types: 1 Glasses of wine per week    Comment: once a month  . Drug use: No  . Sexual activity: Not Currently  Other Topics Concern  . Not on file  Social History Narrative  . Not on file   Social Determinants of Health   Financial Resource Strain:   . Difficulty of Paying Living Expenses: Not on file  Food Insecurity:   . Worried About Charity fundraiser in the Last Year: Not on file  . Ran Out of Food in the Last  Year: Not on file  Transportation Needs:   . Lack of Transportation (Medical): Not on file  . Lack of Transportation (Non-Medical): Not on file  Physical Activity:   . Days of Exercise per Week: Not on file  . Minutes of Exercise per Session: Not on file  Stress:   . Feeling of Stress : Not on file  Social Connections:   . Frequency of Communication with Friends and Family: Not on file  . Frequency of Social Gatherings with Friends and Family: Not on file  . Attends Religious Services: Not on file  . Active Member of Clubs or Organizations: Not on file  . Attends Archivist Meetings: Not on file  . Marital Status: Not on  file     Family History: The patient's family history includes Arthritis in his mother and paternal grandmother; Colon cancer in his paternal grandfather; Diabetes in his father, paternal grandfather, and paternal grandmother; Heart disease in his father, mother, and paternal grandfather; Hyperlipidemia in his maternal grandmother and mother; Hypertension in his father, mother, and another family member. There is no history of Esophageal cancer, Rectal cancer, or Stomach cancer. ROS:   Please see the history of present illness.    All other systems reviewed and are negative.  EKGs/Labs/Other Studies Reviewed:    The following studies were reviewed today:  EKG:  EKG ordered today and personally reviewed.  The ekg ordered today demonstrates sinus rhythm right bundle branch block incomplete right axis deviation unchanged.  2020  Recent Labs: 03/16/2019: TSH 3.320 10/05/2019: ALT 18; BUN 13; Creatinine, Ser 0.83; Hemoglobin 15.2; Platelets 206; Potassium 3.5; Sodium 138  Recent Lipid Panel    Component Value Date/Time   CHOL 262 (H) 09/04/2014 0926   TRIG 317.0 (H) 09/04/2014 0926   HDL 27.60 (L) 09/04/2014 0926   CHOLHDL 9 09/04/2014 0926   VLDL 63.4 (H) 09/04/2014 0926   LDLCALC 126 (H) 10/01/2013 1407   LDLDIRECT 151.2 09/04/2014 0926    Physical  Exam:    VS:  BP 130/72   Pulse 97   Temp 98.5 F (36.9 C)   Ht 5\' 8"  (1.727 m)   Wt 217 lb (98.4 kg)   SpO2 98%   BMI 32.99 kg/m     Wt Readings from Last 3 Encounters:  01/02/20 217 lb (98.4 kg)  11/20/19 217 lb 3.2 oz (98.5 kg)  10/05/19 210 lb (95.3 kg)     GEN:  Well nourished, well developed in no acute distress HEENT: Normal NECK: No JVD; No carotid bruits LYMPHATICS: No lymphadenopathy CARDIAC: RRR, no murmurs, rubs, gallops RESPIRATORY:  Clear to auscultation without rales, wheezing or rhonchi  ABDOMEN: Soft, non-tender, non-distended MUSCULOSKELETAL:  No edema; No deformity  SKIN: Warm and dry NEUROLOGIC:  Alert and oriented x 3 PSYCHIATRIC:  Normal affect    Signed, 14/11/20, MD  01/02/2020 4:56 PM    [facility]

## 2020-01-02 ENCOUNTER — Ambulatory Visit (INDEPENDENT_AMBULATORY_CARE_PROVIDER_SITE_OTHER): Payer: Medicare Other | Admitting: Cardiology

## 2020-01-02 ENCOUNTER — Encounter: Payer: Self-pay | Admitting: Cardiology

## 2020-01-02 ENCOUNTER — Other Ambulatory Visit: Payer: Self-pay

## 2020-01-02 VITALS — BP 130/72 | HR 97 | Temp 98.5°F | Ht 68.0 in | Wt 217.0 lb

## 2020-01-02 DIAGNOSIS — E785 Hyperlipidemia, unspecified: Secondary | ICD-10-CM | POA: Diagnosis not present

## 2020-01-02 DIAGNOSIS — I2510 Atherosclerotic heart disease of native coronary artery without angina pectoris: Secondary | ICD-10-CM

## 2020-01-02 MED ORDER — EZETIMIBE 10 MG PO TABS: 10.0000 mg | ORAL_TABLET | Freq: Every day | ORAL | 3 refills | Status: DC

## 2020-01-02 NOTE — Patient Instructions (Addendum)
Medication Instructions:  Your physician has recommended you make the following change in your medication:  1-START Zetia 10 mg by mouth daily  2-STOP Aspirin on 03/22/20  *If you need a refill on your cardiac medications before your next appointment, please call your pharmacy*  Lab Work: Your physician recommends that you return for lab work- 6 weeks for CMET and fasting lipid panel.  If you have labs (blood work) drawn today and your tests are completely normal, you will receive your results only by: [name] MyChart Message (if you have MyChart) OR . A paper copy in the mail If you have any lab test that is abnormal or we need to change your treatment, we will call you to review the results.  Follow-Up: At [facility], you and your health needs are our priority.  As part of our continuing mission to provide you with exceptional heart care, we have created designated Provider Care Teams.  These Care Teams include your primary Cardiologist (physician) and Advanced Practice Providers (APPs -  Physician Assistants and Nurse Practitioners) who all work together to provide you with the care you need, when you need it.  We recommend signing up for the patient portal called "MyChart".  Sign up information is provided on this After Visit Summary.  MyChart is used to connect with patients for Virtual Visits (Telemedicine).  Patients are able to view lab/test results, encounter notes, upcoming appointments, etc.  Non-urgent messages can be sent to your provider as well.   To learn more about what you can do with MyChart, go to [email or web address].    Your next appointment:   12  month(s)  The format for your next appointment:   In Person  Provider:   You may see Norman Herrlich, MD or the following Advanced Practice Provider on your designated Care Team:    Gillian Shields, FNP

## 2020-01-07 NOTE — Addendum Note (Signed)
Addended by: Theola Sequin on: 01/07/2020 02:08 PM   Modules accepted: Orders

## 2020-02-01 ENCOUNTER — Other Ambulatory Visit: Payer: Self-pay | Admitting: Cardiology

## 2020-02-01 DIAGNOSIS — D2261 Melanocytic nevi of right upper limb, including shoulder: Secondary | ICD-10-CM | POA: Diagnosis not present

## 2020-02-01 DIAGNOSIS — L820 Inflamed seborrheic keratosis: Secondary | ICD-10-CM | POA: Diagnosis not present

## 2020-02-01 DIAGNOSIS — D1801 Hemangioma of skin and subcutaneous tissue: Secondary | ICD-10-CM | POA: Diagnosis not present

## 2020-02-01 DIAGNOSIS — D225 Melanocytic nevi of trunk: Secondary | ICD-10-CM | POA: Diagnosis not present

## 2020-02-01 DIAGNOSIS — L821 Other seborrheic keratosis: Secondary | ICD-10-CM | POA: Diagnosis not present

## 2020-02-01 DIAGNOSIS — L570 Actinic keratosis: Secondary | ICD-10-CM | POA: Diagnosis not present

## 2020-04-02 ENCOUNTER — Telehealth: Payer: Self-pay | Admitting: Cardiology

## 2020-04-02 DIAGNOSIS — I25119 Atherosclerotic heart disease of native coronary artery with unspecified angina pectoris: Secondary | ICD-10-CM

## 2020-04-02 DIAGNOSIS — I422 Other hypertrophic cardiomyopathy: Secondary | ICD-10-CM

## 2020-04-02 DIAGNOSIS — E785 Hyperlipidemia, unspecified: Secondary | ICD-10-CM

## 2020-04-02 NOTE — Telephone Encounter (Signed)
Spoke to the patient just now and he let me know that he tried to get his lab work done last week and he was told that they did not have a proper order for these labs. I placed the order again for the lipid panel and the cmp for him to have completed. He verbalizes understanding.   Patient also has a question in regards to if he is able to take Aleve or not. He states that Dr. Dulce Sellar told him not to several years back but he was on Plavix at that time.   I am routing to Dr. Dulce Sellar for further advise.

## 2020-04-02 NOTE — Telephone Encounter (Signed)
Left message on patients voicemail to please return our call.   

## 2020-04-02 NOTE — Telephone Encounter (Signed)
New Message   Pt is calling and would like to discuss some information with Dr Hulen Shouts nurse    Please call

## 2020-04-02 NOTE — Telephone Encounter (Signed)
Spoke with the patient just now and let him know that he could take this Aleve as needed. He states that he is only going to take one because he had a root canal and needs to have some relief. No other issues or concerns were noted at this time.    Encouraged patient to call back with any questions or concerns.

## 2020-04-02 NOTE — Telephone Encounter (Signed)
Patient returned your call.

## 2020-04-02 NOTE — Telephone Encounter (Signed)
I think he be able to take Aleve if he is not having GI upset along with clopidogrel.

## 2020-05-02 ENCOUNTER — Telehealth: Payer: Self-pay

## 2020-05-02 ENCOUNTER — Telehealth: Payer: Self-pay | Admitting: *Deleted

## 2020-05-02 NOTE — Telephone Encounter (Signed)
Spoke with the patient just now and got him scheduled for an appointment with Dr. Tomie China on 05/19/20 as this was the soonest appointment available for me to get the patient in [place]. Dr. Servando Salina is aware and told me to force the patient in. Patient verbalizes understanding and states that he has seen Dr. Tomie China before. No other issues or concerns were noted at this time.    Encouraged patient to call back with any questions or concerns.

## 2020-05-02 NOTE — Telephone Encounter (Signed)
While pt was in [place] office getting lab work wanted to let Dr. Dulce Sellar know that he has been having fluttering and pounding of his heart for 1 week with some intermittent pain in lower L arm. Please advise

## 2020-05-02 NOTE — Telephone Encounter (Signed)
Spoke to the patient just now and he let me know that he has been feeling his heart beat. He described it as "rapid and hard". He states that it is just sporadic but has been coming and going for the past week. He denies any chest pain/pressure. He does state that he has some minor pain in his lower left arm at times. Denies any SOB/dizziness/lightheadedness.   I will route to Dr.Tobb for further recommendations.

## 2020-05-03 LAB — LIPID PANEL
Chol/HDL Ratio: 7.2 ratio — ABNORMAL HIGH (ref 0.0–5.0)
Cholesterol, Total: 217 mg/dL — ABNORMAL HIGH (ref 100–199)
HDL: 30 mg/dL — ABNORMAL LOW (ref >39)
LDL Chol Calc (NIH): 147 mg/dL — ABNORMAL HIGH (ref 0–99)
Triglycerides: 220 mg/dL — ABNORMAL HIGH (ref 0–149)
VLDL Cholesterol Cal: 40 mg/dL (ref 5–40)

## 2020-05-03 LAB — COMPREHENSIVE METABOLIC PANEL WITH GFR
ALT: 21 IU/L (ref 0–44)
AST: 22 IU/L (ref 0–40)
Albumin/Globulin Ratio: 1.9 (ref 1.2–2.2)
Albumin: 4.3 g/dL (ref 3.8–4.8)
Alkaline Phosphatase: 72 IU/L (ref 48–121)
BUN/Creatinine Ratio: 10 (ref 10–24)
BUN: 10 mg/dL (ref 8–27)
Bilirubin Total: 0.7 mg/dL (ref 0.0–1.2)
CO2: 19 mmol/L — ABNORMAL LOW (ref 20–29)
Calcium: 9.4 mg/dL (ref 8.6–10.2)
Chloride: 106 mmol/L (ref 96–106)
Creatinine, Ser: 1.03 mg/dL (ref 0.76–1.27)
GFR calc Af Amer: 88 mL/min/1.73
GFR calc non Af Amer: 76 mL/min/1.73
Globulin, Total: 2.3 g/dL (ref 1.5–4.5)
Glucose: 96 mg/dL (ref 65–99)
Potassium: 4.1 mmol/L (ref 3.5–5.2)
Sodium: 142 mmol/L (ref 134–144)
Total Protein: 6.6 g/dL (ref 6.0–8.5)

## 2020-05-05 ENCOUNTER — Telehealth: Payer: Self-pay

## 2020-05-05 ENCOUNTER — Encounter: Payer: Self-pay | Admitting: Family Medicine

## 2020-05-05 ENCOUNTER — Other Ambulatory Visit: Payer: Self-pay

## 2020-05-05 ENCOUNTER — Ambulatory Visit (INDEPENDENT_AMBULATORY_CARE_PROVIDER_SITE_OTHER): Payer: Medicare Other | Admitting: Family Medicine

## 2020-05-05 VITALS — BP 130/64 | HR 94 | Temp 98.2°F | Wt 223.6 lb

## 2020-05-05 DIAGNOSIS — E538 Deficiency of other specified B group vitamins: Secondary | ICD-10-CM

## 2020-05-05 DIAGNOSIS — I25119 Atherosclerotic heart disease of native coronary artery with unspecified angina pectoris: Secondary | ICD-10-CM

## 2020-05-05 DIAGNOSIS — R1011 Right upper quadrant pain: Secondary | ICD-10-CM

## 2020-05-05 DIAGNOSIS — R002 Palpitations: Secondary | ICD-10-CM

## 2020-05-05 DIAGNOSIS — G629 Polyneuropathy, unspecified: Secondary | ICD-10-CM | POA: Diagnosis not present

## 2020-05-05 DIAGNOSIS — E785 Hyperlipidemia, unspecified: Secondary | ICD-10-CM

## 2020-05-05 DIAGNOSIS — R739 Hyperglycemia, unspecified: Secondary | ICD-10-CM

## 2020-05-05 NOTE — Telephone Encounter (Signed)
-----   Message from Baldo Daub, MD sent at 05/03/2020  4:28 PM EDT ----- Normal or stable result  His lipids are severely elevated goal LDL is less than 77 high risk group he has is twice that at 147 on Zetia he should be taking Repatha 140 mg every 2 weeks we can try to order if any problem with pharmacy benefit management refer to the lipid clinic Pharm.D.  He will need to have a lipid profile performed about 6 weeks after starting Repatha.  Please tell him that he has no muscle symptoms.

## 2020-05-05 NOTE — Telephone Encounter (Signed)
Patient is returning call.  °

## 2020-05-05 NOTE — Telephone Encounter (Signed)
Left message on patients voicemail to please return our call.   

## 2020-05-05 NOTE — Progress Notes (Signed)
   Subjective:    Patient ID: Edward Curtis, male    DOB: [birth year], 64 y.o.   MRN: [id number]  HPI Here for a number of concerns. First he worries he may have diabetes, though his glucoses have always been normal. He has a strong family hx of diabetes. For the past 2 years he has had numbness and tingling in both feet and now it has travelled into the lower legs. No pain so far. No hand involvement. Second he asks for an EKG. He has had palpitations off and on for a few months, and he describes these as fluttering or racing of his heart. No chest pain or SOB. He has had stents placed in his coronaries. He has an appt to see Cardiology on 05-19-20, but they suggested he see [place] before then for an EKG. Today he feels fine. Third for the past few months he has had spells of RUQ pain which starts an hour after he eats a meal and it lasts several hours. He often feels nauseated during these spells but he has not vomited. No fevers. No heartburn.    Review of Systems  Constitutional: Negative.   Respiratory: Negative.   Cardiovascular: Positive for palpitations. Negative for chest pain and leg swelling.  Gastrointestinal: Positive for abdominal pain and nausea. Negative for abdominal distention, blood in stool, constipation, diarrhea, rectal pain and vomiting.  Genitourinary: Negative.   Neurological: Positive for numbness. Negative for weakness.       Objective:   Physical Exam Constitutional:      Appearance: Normal appearance. He is well-developed. He is not ill-appearing.  Cardiovascular:     Rate and Rhythm: Normal rate and regular rhythm.     Pulses: Normal pulses.     Heart sounds: Normal heart sounds.     Comments: EKG shows NSR with RBBB (no change from at least 2014)  Pulmonary:     Effort: Pulmonary effort is normal.     Breath sounds: Normal breath sounds.  Lymphadenopathy:     Cervical: No cervical adenopathy.  Neurological:     Mental Status: He is alert.             Assessment & Plan:  For palpitations, the etiology remains unclear. He will see Cardiology on 05-19-20 to evaluate further. For the RUQ pain, we will set up an abdominal US soon. Gallbladder disease appears likely. For the neuropathy, we will screen him with labs to check an A1c, B12, etc.  Gershon Crane, MD

## 2020-05-05 NOTE — Telephone Encounter (Signed)
Spoke with patient regarding results and recommendation.  Patient verbalizes understanding and is agreeable to plan of care. Advised patient to call back with any issues or concerns.  

## 2020-05-06 LAB — HEMOGLOBIN A1C
Hgb A1c MFr Bld: 5.6 % of total Hgb (ref <5.7)
Mean Plasma Glucose: 114 (calc)
eAG (mmol/L): 6.3 (calc)

## 2020-05-06 LAB — TSH: TSH: 4.98 mIU/L — ABNORMAL HIGH (ref 0.40–4.50)

## 2020-05-06 LAB — T3, FREE: T3, Free: 3.3 pg/mL (ref 2.3–4.2)

## 2020-05-06 LAB — T4, FREE: Free T4: 1 ng/dL (ref 0.8–1.8)

## 2020-05-06 LAB — VITAMIN B12: Vitamin B-12: 197 pg/mL — ABNORMAL LOW (ref 200–1100)

## 2020-05-13 ENCOUNTER — Ambulatory Visit: Payer: Federal, State, Local not specified - PPO | Admitting: Cardiology

## 2020-05-14 ENCOUNTER — Other Ambulatory Visit: Payer: Self-pay

## 2020-05-14 ENCOUNTER — Encounter: Payer: Self-pay | Admitting: Pharmacist

## 2020-05-14 ENCOUNTER — Ambulatory Visit (INDEPENDENT_AMBULATORY_CARE_PROVIDER_SITE_OTHER): Payer: Medicare Other | Admitting: Pharmacist

## 2020-05-14 DIAGNOSIS — E782 Mixed hyperlipidemia: Secondary | ICD-10-CM | POA: Diagnosis not present

## 2020-05-14 DIAGNOSIS — M791 Myalgia, unspecified site: Secondary | ICD-10-CM | POA: Diagnosis not present

## 2020-05-14 DIAGNOSIS — T466X5A Adverse effect of antihyperlipidemic and antiarteriosclerotic drugs, initial encounter: Secondary | ICD-10-CM | POA: Diagnosis not present

## 2020-05-14 DIAGNOSIS — I2510 Atherosclerotic heart disease of native coronary artery without angina pectoris: Secondary | ICD-10-CM

## 2020-05-14 DIAGNOSIS — I25119 Atherosclerotic heart disease of native coronary artery with unspecified angina pectoris: Secondary | ICD-10-CM | POA: Diagnosis not present

## 2020-05-14 NOTE — Patient Instructions (Addendum)
It was nice meeting you today!  Your goal LDL level is less than 70  We are going to start you on Repatha, which is one injection every 2 weeks  Dr Dulce Sellar is going to check your levels in 6 weeks  Remember to watch how you eat.  Concentrate on vegetables, lean proteins, healthy fats, and whole grains.  Work on cutting back on sugary drinks.   Please call with any questions  Laural Golden, PharmD, BCACP, CDCES [facility] [address] [place], [place] [address] Phone: [phone]; Fax: [phone] 05/14/2020 4:03 PM

## 2020-05-14 NOTE — Progress Notes (Signed)
Patient ID: Edward Curtis                 DOB: 28-Apr-1956                    MRN: [id number]     HPI: Edward Curtis is a 64 y.o. male patient referred to lipid clinic by Dr Dulce Sellar. PMH is significant for CAD, andgina, pre DM, and HLD.  Had coronary artery stented in May 2020.    Patient presents today in good spirits.  Cholesterol has been managed solely on Zetia 10 mg for the past 3 months since patient is intolerant to all statins.  Remarks he has gained ~50 pounds in past 3 years since his brother died.  Lives alone and reports he does not cook food that is good for him.  Has not been physically active.  Denies tobacco and only drinks minimal alcohol.  Tried to eat healthier after his heart stent but then discontinued again.  Current Medications: Zetia 10 mg Intolerances: Atorvastatin 80mg , rosuvastatin 5 mg, pravastatin 20 mg.  Reports had muscle pain, joint pain, constipation Risk Factors:  HTN, CAD, angina, HLD,  LDL goal: <70  Diet: Reports sugary drinks, high fat foods  Exercise: Very little  Family History: Mother had elevated cholesterol   Labs: TC 217, Trigs 220, HDL 30, LDL 147.  Last A1c 5.6.  Past Medical History:  Diagnosis Date   Allergy    seasonal allergies    Arthritis    Asthma    Blood transfusion without reported diagnosis 1985   Calcium oxalate renal stones    early 1990's   Chicken pox    Chronic neck pain    Depression    GERD (gastroesophageal reflux disease)    Hyperlipidemia 10/10/2013   Hypertrophic obstructive cardiomyopathy(425.11) 10/10/2013   IBS (irritable bowel syndrome) 12/07/2011   Migraines    Neuromuscular disorder (HCC)    radiculopathy left side    Obesity (BMI 30-39.9) 10/10/2013   Seasonal allergies     Current Outpatient Medications on File Prior to Visit  Medication Sig Dispense Refill   acetaminophen (TYLENOL) 500 MG tablet Take 1,000 mg by mouth daily as needed for moderate pain.      aspirin EC 81 MG tablet  Take 81 mg by mouth daily.     clopidogrel (PLAVIX) 75 MG tablet Take 1 tablet (75 mg total) by mouth daily. 90 tablet 3   diphenhydrAMINE (BENADRYL) 25 MG tablet Take 25 mg by mouth daily as needed (allergic reactions).     EPINEPHrine (EPIPEN 2-PAK) 0.3 mg/0.3 mL SOAJ injection Inject 0.3 mLs (0.3 mg total) into the muscle once as needed (for severe allergic reaction). CAll 911 immediately if you have to use this medicine 1 Device 1   ezetimibe (ZETIA) 10 MG tablet Take 1 tablet (10 mg total) by mouth daily. 90 tablet 3   nitroGLYCERIN (NITROSTAT) 0.4 MG SL tablet Place 1 tablet (0.4 mg total) under the tongue every 5 (five) minutes as needed for chest pain. 90 tablet 3   oxyCODONE-acetaminophen (PERCOCET/ROXICET) 5-325 MG tablet Take 1 tablet by mouth daily as needed for severe pain.      polyethylene glycol (MIRALAX / GLYCOLAX) 17 g packet Take 17 g by mouth daily as needed for moderate constipation.     valACYclovir (VALTREX) 500 MG tablet Take 500 mg by mouth daily as needed (cold sores). Take for 5 days at first sign of outbreak     verapamil (CALAN)  80 MG tablet TAKE ONE TABLET DAILY AS NEEDED 90 tablet 2   verapamil (CALAN-SR) 180 MG CR tablet TAKE ONE TABLET DAILY 90 tablet 3   No current facility-administered medications on file prior to visit.    Allergies  Allergen Reactions   Ciprofloxacin Other (See Comments)    Possible "leg swelling, pain, unable to walk"   Iodinated Diagnostic Agents Anaphylaxis, Hives, Itching and Swelling    Patient must be premedicated with 13 hour protocol prior to IV contrast administration   Penicillins Hives    Did it involve swelling of the face/tongue/throat, SOB, or low BP? No Did it involve sudden or severe rash/hives, skin peeling, or any reaction on the inside of your mouth or nose? Yes Did you need to seek medical attention at a hospital or doctor's office? Yes When did it last happen?25 + years ago If all above answers are  NO, may proceed with cephalosporin use.    Atorvastatin     myaglias   Clindamycin/Lincomycin Diarrhea and Nausea Only   Erythromycin Diarrhea and Nausea Only   Ibuprofen Other (See Comments)    Causes tachycardia   Keflex [Cephalexin]     Rash hives   Other Itching and Other (See Comments)     Walnut trees and walnuts , sores in mouth   Rosuvastatin     myaligias   Sulfa Antibiotics Hives   Latex Rash    Developed rash 06/2017 that progressed.  Allergy testing positive for latex allergy.  Later determined rash was 2/2 latex in [employer] foods.    Assessment/Plan:  1. Hyperlipidemia - Patient LDL 147 which is above goal of <70.  Likely due to statin intolerance, poor diet, and sedentary lifestyle.  Counseled patient on healthy eating.  Emphasized foods low in cholesterol and saturated/trans fats and to cut down on simple sugars such as sodas.  Recommended patient increase consumption of vegetables, lean proteins, unsaturated fats, and whole grains and gave options.  Recommended to increase physical activity.    Since patient reports feeling down since brother died, recommended speaking with PCP about possible treatment options.  Also recommended patient request new prescriptions for nitroglycerin and EpiPen since he knows his current meds are very old.  Patient is not a candidate for statin therapy but is willing to try PCSK9.  Using demo pen, counseled patient on storage, site preparation, and administration.  Patient able to use demo pen and was able to demonstrate use in room.  Patient already has repeat lipid panel scheduled.  Reports will follow up as needed.  Laural Golden, PharmD, BCACP, CDCES [facility] [address], [place], [place] [address] Phone: [phone]; Fax: [phone] 05/14/2020 4:45 PM

## 2020-05-19 ENCOUNTER — Telehealth: Payer: Self-pay

## 2020-05-19 ENCOUNTER — Ambulatory Visit: Payer: Federal, State, Local not specified - PPO | Admitting: Cardiology

## 2020-05-19 MED ORDER — REPATHA SURECLICK 140 MG/ML ~~LOC~~ SOAJ: 140.0000 mg | SUBCUTANEOUS | 11 refills | Status: DC

## 2020-05-19 NOTE — Telephone Encounter (Signed)
-----   Message from Cheree Ditto, [facility] sent at 05/16/2020  8:17 AM EDT ----- Regarding: pcsk9 Patient would like to start repatha/praluent.  Did not send in.  Please contact him though because he is concerned because he is retired but still works part time and does not know if that will change things

## 2020-05-19 NOTE — Telephone Encounter (Signed)
Called and spoke w/pt instructed him to start repatha, pt was concerned for cost, I stated we won't know until we send it, pt verbalized understanding, rx sent, and pt also advised to call back if issues arise

## 2020-05-20 ENCOUNTER — Ambulatory Visit
Admission: RE | Admit: 2020-05-20 | Discharge: 2020-05-20 | Disposition: A | Discharge status: Home / Self Care | Payer: Medicare Other | Source: Ambulatory Visit | Attending: Family Medicine | Admitting: Family Medicine

## 2020-05-20 DIAGNOSIS — R1011 Right upper quadrant pain: Secondary | ICD-10-CM

## 2020-05-22 ENCOUNTER — Other Ambulatory Visit: Payer: Self-pay | Admitting: Cardiology

## 2020-05-22 MED ORDER — REPATHA SURECLICK 140 MG/ML ~~LOC~~ SOAJ: 140.0000 mg | SUBCUTANEOUS | 11 refills | Status: DC

## 2020-05-22 NOTE — Addendum Note (Signed)
Addended by: Malena Peer D on: 05/22/2020 04:21 PM   Modules accepted: Orders

## 2020-05-22 NOTE — Telephone Encounter (Signed)
Patient called back stating his repatha copay card was not working. States the pharmacy told him it was because he was a retired Engineer, maintenance. Advised patient it is because his insurance requires he use [facility]. I sent Rx to pharmacy for patient and gave him the phone number. I advised that he provide them with the copay card information when he sets up shipment. Patient to call back if he has any further issues.

## 2020-05-29 ENCOUNTER — Telehealth: Payer: Self-pay | Admitting: Family Medicine

## 2020-05-29 MED ORDER — EPINEPHRINE 0.3 MG/0.3ML IJ SOAJ: 0.3000 mg | Freq: Once | INTRAMUSCULAR | 1 refills | Status: DC | PRN

## 2020-05-29 MED ORDER — EPINEPHRINE 0.3 MG/0.3ML IJ SOAJ: 0.3000 mg | Freq: Once | INTRAMUSCULAR | 1 refills | Status: AC | PRN

## 2020-05-29 NOTE — Telephone Encounter (Signed)
Pt call and stated that he need a new  sent to EPINEPHrine (EPIPEN 2-PAK) 0.3 mg/0.3 mL SOAJ injection sent to  [facility], [place] - [address] Phone:  [phone]  Fax:  [phone]

## 2020-05-29 NOTE — Addendum Note (Signed)
Addended by: Solon Augusta on: 05/29/2020 11:05 AM   Modules accepted: Orders

## 2020-05-29 NOTE — Telephone Encounter (Signed)
Rx sent in. Spoke with the patient. He is aware his medication has been sent in.

## 2020-07-29 DIAGNOSIS — E785 Hyperlipidemia, unspecified: Secondary | ICD-10-CM

## 2020-07-29 NOTE — Addendum Note (Signed)
Addended by: Delorse Limber I on: 07/29/2020 11:35 AM   Modules accepted: Orders

## 2020-09-26 ENCOUNTER — Encounter: Payer: Self-pay | Admitting: Internal Medicine

## 2020-09-26 ENCOUNTER — Other Ambulatory Visit: Payer: Self-pay

## 2020-09-26 ENCOUNTER — Ambulatory Visit (INDEPENDENT_AMBULATORY_CARE_PROVIDER_SITE_OTHER): Payer: Medicare Other | Admitting: Internal Medicine

## 2020-09-26 VITALS — BP 130/82 | HR 88 | Ht 68.0 in | Wt 229.4 lb

## 2020-09-26 DIAGNOSIS — E785 Hyperlipidemia, unspecified: Secondary | ICD-10-CM | POA: Diagnosis not present

## 2020-09-26 DIAGNOSIS — T466X5A Adverse effect of antihyperlipidemic and antiarteriosclerotic drugs, initial encounter: Secondary | ICD-10-CM | POA: Diagnosis not present

## 2020-09-26 DIAGNOSIS — M791 Myalgia, unspecified site: Secondary | ICD-10-CM | POA: Diagnosis not present

## 2020-09-26 DIAGNOSIS — I25119 Atherosclerotic heart disease of native coronary artery with unspecified angina pectoris: Secondary | ICD-10-CM | POA: Diagnosis not present

## 2020-09-26 DIAGNOSIS — I2510 Atherosclerotic heart disease of native coronary artery without angina pectoris: Secondary | ICD-10-CM | POA: Diagnosis not present

## 2020-09-26 NOTE — Patient Instructions (Signed)
Medication Instructions:  RESUME REPATHA- PLEASE CALL OFFICE IF ANY ISSUES  *If you need a refill on your cardiac medications before your next appointment, please call your pharmacy*  Lab Work: FASTING CHOLESTEROL BLOOD WORK IN 3 MONTHS- PRIOR TO FOLLOW UP APPOINTMENT If you have labs (blood work) drawn today and your tests are completely normal, you will receive your results only by: [name] MyChart Message (if you have MyChart) OR . A paper copy in the mail If you have any lab test that is abnormal or we need to change your treatment, we will call you to review the results.  Follow-Up: At [facility], you and your health needs are our priority.  As part of our continuing mission to provide you with exceptional heart care, we have created designated Provider Care Teams.  These Care Teams include your primary Cardiologist (physician) and Advanced Practice Providers (APPs -  Physician Assistants and Nurse Practitioners) who all work together to provide you with the care you need, when you need it.  Your next appointment:   3 month(s) LIPID CLINIC   The format for your next appointment:   In Person  Provider:   Kirtland Bouchard [place] Hilty, MD

## 2020-09-26 NOTE — Progress Notes (Signed)
LIPID CLINIC CONSULT NOTE  Chief Complaint:  Manage dyslipidemia  Primary Care Physician: [name], MD  Primary Cardiologist:  Norman Herrlich, MD  HPI:  Edward Curtis is a 64 y.o. male who is being seen today for the evaluation of dyslipidemia at the request of Dulce Sellar, Iline Oven, MD. This is a 64 year old male kindly referred by Dr. Dulce Sellar for evaluation and management of dyslipidemia.  He previously seen Laural Golden, PharmD in our lipid clinic and was started on Repatha.  This was following several medication changes including a number of statins both high and low potency that he did not tolerate as well as adding ezetimibe.  He said he had only been on ezetimibe for a couple of weeks and then started on Repatha.  He has a history of low back pain and also has a history of coronary disease with a stent in 2020.  He was previously followed by Dr. Donnie Aho.  After giving himself the first injection of Repatha he reported low back pain which worsened over the course of about a week and eventually improved.  He felt that this could be related since he looked up side effects of the medicine which included back pain and then did not pursue any more doses of the medication.  Prior to starting the medicine in July his total cholesterol 217, triglycerides 220 HDL 30 and LDL of 147 with a target LDL of less than 70 given his coronary disease history.  PMHx:  Past Medical History:  Diagnosis Date  . Allergy    seasonal allergies   . Arthritis   . Asthma   . Blood transfusion without reported diagnosis 1985  . Calcium oxalate renal stones    early 1990's  . Chicken pox   . Chronic neck pain   . Depression   . GERD (gastroesophageal reflux disease)   . Hyperlipidemia 10/10/2013  . Hypertrophic obstructive cardiomyopathy(425.11) 10/10/2013  . IBS (irritable bowel syndrome) 12/07/2011  . Migraines   . Neuromuscular disorder (HCC)    radiculopathy left side   . Obesity (BMI 30-39.9)  10/10/2013  . Seasonal allergies     Past Surgical History:  Procedure Laterality Date  . CORONARY STENT INTERVENTION N/A 03/23/2019   Procedure: CORONARY STENT INTERVENTION;  Surgeon: Marykay Lex, MD;  [facility]: [facility] INVASIVE CV LAB;  Service: Cardiovascular;  Laterality: N/A;  . LEFT HEART CATH AND CORONARY ANGIOGRAPHY N/A 03/23/2019   Procedure: LEFT HEART CATH AND CORONARY ANGIOGRAPHY;  Surgeon: Marykay Lex, MD;  Location: [facility] INVASIVE CV LAB;  Service: Cardiovascular;  Laterality: N/A;  . left knee surgery  1990  . MOUTH SURGERY  1975/1993  . pre cancerous mole  2013  . prostate procedure  1980  . umbilical cyst  1985    FAMHx:  Family History  Problem Relation Age of Onset  . Arthritis Mother   . Hyperlipidemia Mother   . Heart disease Mother   . Hypertension Mother   . Heart disease Father   . Hypertension Father   . Diabetes Father   . Arthritis Paternal Grandmother   . Diabetes Paternal Grandmother   . Colon cancer Paternal Grandfather   . Heart disease Paternal Grandfather   . Diabetes Paternal Grandfather   . Hyperlipidemia Maternal Grandmother   . Hypertension Other        both sets of parents  . Esophageal cancer Neg Hx   . Rectal cancer Neg Hx   . Stomach cancer Neg Hx  SOCHx:   reports that he has never smoked. He has never used smokeless tobacco. He reports current alcohol use of about 1.0 standard drink of alcohol per week. He reports that he does not use drugs.  ALLERGIES:  Allergies  Allergen Reactions  . Ciprofloxacin Other (See Comments)    Possible "leg swelling, pain, unable to walk"  . Iodinated Diagnostic Agents Anaphylaxis, Hives, Itching and Swelling    Patient must be premedicated with 13 hour protocol prior to IV contrast administration  . Penicillins Hives    Did it involve swelling of the face/tongue/throat, SOB, or low BP? No Did it involve sudden or severe rash/hives, skin peeling, or any reaction on the inside of your mouth  or nose? Yes Did you need to seek medical attention at a hospital or doctor's office? Yes When did it last happen?25 + years ago If all above answers are "NO", may proceed with cephalosporin use.   . Atorvastatin     myaglias  . Clindamycin/Lincomycin Diarrhea and Nausea Only  . Erythromycin Diarrhea and Nausea Only  . Ibuprofen Other (See Comments)    Causes tachycardia  . Keflex [Cephalexin]     Rash hives  . Other Itching and Other (See Comments)     Walnut trees and walnuts , sores in mouth  . Rosuvastatin     myaligias  . Sulfa Antibiotics Hives  . Latex Rash    Developed rash 06/2017 that progressed.  Allergy testing positive for latex allergy.  Later determined rash was 2/2 latex in [employer] foods.    ROS: Pertinent items noted in HPI and remainder of comprehensive ROS otherwise negative.  HOME MEDS: Current Outpatient Medications on File Prior to Visit  Medication Sig Dispense Refill  . acetaminophen (TYLENOL) 500 MG tablet Take 1,000 mg by mouth daily as needed for moderate pain.     [name] aspirin EC 81 MG tablet Take 81 mg by mouth daily.    . diphenhydrAMINE (BENADRYL) 25 MG tablet Take 25 mg by mouth daily as needed (allergic reactions).    . EPINEPHrine (EPIPEN 2-PAK) 0.3 mg/0.3 mL IJ SOAJ injection Inject 0.3 mLs (0.3 mg total) into the muscle once as needed (for severe allergic reaction). CAll 911 immediately if you have to use this medicine 1 each 1  . ezetimibe (ZETIA) 10 MG tablet Take 1 tablet (10 mg total) by mouth daily. 90 tablet 3  . nitroGLYCERIN (NITROSTAT) 0.4 MG SL tablet ONE TABLET UNDER TONGUE EVERY 5 MINUTES AS NEEDED FOR CHEST PAIN 75 tablet 1  . oxyCODONE-acetaminophen (PERCOCET/ROXICET) 5-325 MG tablet Take 1 tablet by mouth daily as needed for severe pain.     . polyethylene glycol (MIRALAX / GLYCOLAX) 17 g packet Take 17 g by mouth daily as needed for moderate constipation.    . valACYclovir (VALTREX) 500 MG tablet Take 500 mg by mouth daily  as needed (cold sores). Take for 5 days at first sign of outbreak    . verapamil (CALAN) 80 MG tablet TAKE ONE TABLET DAILY AS NEEDED 90 tablet 2  . verapamil (CALAN-SR) 180 MG CR tablet TAKE ONE TABLET DAILY 90 tablet 3  . Evolocumab (REPATHA SURECLICK) 140 MG/ML SOAJ Inject 140 mg into the skin every 14 (fourteen) days. 2 pen 11   No current facility-administered medications on file prior to visit.    LABS/IMAGING: No results found for this or any previous visit (from the past 48 hour(s)). No results found.  LIPID PANEL:    Component Value  Date/Time   CHOL 217 (H) 05/02/2020 0810   TRIG 220 (H) 05/02/2020 0810   HDL 30 (L) 05/02/2020 0810   CHOLHDL 7.2 (H) 05/02/2020 0810   CHOLHDL 9 09/04/2014 0926   VLDL 63.4 (H) 09/04/2014 0926   LDLCALC 147 (H) 05/02/2020 0810   LDLDIRECT 151.2 09/04/2014 0926    WEIGHTS: Wt Readings from Last 3 Encounters:  09/26/20 229 lb 6.4 oz (104.1 kg)  05/05/20 223 lb 9.6 oz (101.4 kg)  01/02/20 217 lb (98.4 kg)    VITALS: BP 130/82   Pulse 88   Ht 5\' 8"  (1.727 m)   Wt 229 lb 6.4 oz (104.1 kg)   BMI 34.88 kg/m   EXAM: General appearance: alert and no distress Neck: no carotid bruit, no JVD and thyroid not enlarged, symmetric, no tenderness/mass/nodules Lungs: clear to auscultation bilaterally Heart: regular rate and rhythm, S1, S2 normal, no murmur, click, rub or gallop Abdomen: soft, non-tender; bowel sounds normal; no masses,  no organomegaly Extremities: extremities normal, atraumatic, no cyanosis or edema Pulses: 2+ and symmetric Skin: Skin color, texture, turgor normal. No rashes or lesions Neurologic: Grossly normal Psych: Pleasant  EKG: Deferred  ASSESSMENT: 1. CAD status post PCI in 2020 2. Mixed dyslipidemia, goal LDL less than 70 3. Statin intolerance-myalgias 4. Chronic low back pain  PLAN: 1.   Mr.Struthers has a mixed dyslipidemia and remains about 50% over his target LDL.  He is on ezetimibe but had not been on it  very long when his last lipids were drawn in July.  He is not had a reassessment since then and was approved for Repatha.  He had 1 dose and then developed some back pain the next day but does have a history of chronic low back pain.  That worsened and persisted over a week which is unusual for PCSK9 inhibitor.  I reassured him that I felt was less likely that was the case and that he should retry the Repatha to see if it is associated with similar side effects again.  If it is well-tolerated then I would remain on it in addition to the ezetimibe with a plan for repeat lipids in about 3 to 4 months.  He can follow-up with me at that time  August, MD, [facility]  [place]  [facility]  Medical Director of the Advanced Lipid Disorders &  Cardiovascular Risk Reduction Clinic Diplomate of the American Board of Clinical Lipidology Attending Cardiologist  Direct Dial: [phone]  Fax: [phone]  Website:  [email or web address] Rivkah Wolz 09/26/2020, 11:31 AM

## 2020-10-25 HISTORY — PX: CATARACT EXTRACTION: SUR2

## 2020-11-20 ENCOUNTER — Ambulatory Visit: Payer: Federal, State, Local not specified - PPO | Admitting: Internal Medicine

## 2020-11-27 ENCOUNTER — Other Ambulatory Visit: Payer: Self-pay | Admitting: Cardiology

## 2020-12-17 ENCOUNTER — Other Ambulatory Visit: Payer: Self-pay | Admitting: Cardiology

## 2020-12-17 NOTE — Telephone Encounter (Signed)
Refill sent to pharmacy.   

## 2020-12-20 LAB — LIPID PANEL
Chol/HDL Ratio: 2.7 ratio (ref 0.0–5.0)
Cholesterol, Total: 95 mg/dL — ABNORMAL LOW (ref 100–199)
HDL: 35 mg/dL — ABNORMAL LOW (ref >39)
LDL Chol Calc (NIH): 32 mg/dL (ref 0–99)
Triglycerides: 170 mg/dL — ABNORMAL HIGH (ref 0–149)
VLDL Cholesterol Cal: 28 mg/dL (ref 5–40)

## 2020-12-31 ENCOUNTER — Other Ambulatory Visit: Payer: Self-pay

## 2020-12-31 ENCOUNTER — Encounter: Payer: Self-pay | Admitting: Internal Medicine

## 2020-12-31 ENCOUNTER — Ambulatory Visit (INDEPENDENT_AMBULATORY_CARE_PROVIDER_SITE_OTHER): Payer: Medicare Other | Admitting: Internal Medicine

## 2020-12-31 VITALS — BP 120/80 | HR 93 | Ht 68.0 in | Wt 229.0 lb

## 2020-12-31 DIAGNOSIS — I25119 Atherosclerotic heart disease of native coronary artery with unspecified angina pectoris: Secondary | ICD-10-CM | POA: Diagnosis not present

## 2020-12-31 DIAGNOSIS — E782 Mixed hyperlipidemia: Secondary | ICD-10-CM

## 2020-12-31 DIAGNOSIS — M791 Myalgia, unspecified site: Secondary | ICD-10-CM

## 2020-12-31 DIAGNOSIS — T466X5D Adverse effect of antihyperlipidemic and antiarteriosclerotic drugs, subsequent encounter: Secondary | ICD-10-CM

## 2020-12-31 DIAGNOSIS — T466X5A Adverse effect of antihyperlipidemic and antiarteriosclerotic drugs, initial encounter: Secondary | ICD-10-CM

## 2020-12-31 NOTE — Patient Instructions (Signed)
Medication Instructions:  Your physician recommends that you continue on your current medications as directed. Please refer to the Current Medication list given to you today.  *If you need a refill on your cardiac medications before your next appointment, please call your pharmacy*   Lab Work: FASTING lab work in 6 months to check cholesterol   If you have labs (blood work) drawn today and your tests are completely normal, you will receive your results only by: MyChart Message (if you have MyChart) OR A paper copy in the mail If you have any lab test that is abnormal or we need to change your treatment, we will call you to review the results.   Testing/Procedures: NONE   Follow-Up: At [facility], you and your health needs are our priority.  As part of our continuing mission to provide you with exceptional heart care, we have created designated Provider Care Teams.  These Care Teams include your primary Cardiologist (physician) and Advanced Practice Providers (APPs -  Physician Assistants and Nurse Practitioners) who all work together to provide you with the care you need, when you need it.  We recommend signing up for the patient portal called "MyChart".  Sign up information is provided on this After Visit Summary.  MyChart is used to connect with patients for Virtual Visits (Telemedicine).  Patients are able to view lab/test results, encounter notes, upcoming appointments, etc.  Non-urgent messages can be sent to your provider as well.   To learn more about what you can do with MyChart, go to [email or web address].    Your next appointment:   6 month(s) - lipid clinic  The format for your next appointment:   In Person  Provider:   K. Chad Hilty, MD   Other Instructions  

## 2020-12-31 NOTE — Progress Notes (Signed)
LIPID CLINIC CONSULT NOTE  Chief Complaint:  Manage dyslipidemia  Primary Care Physician: [name], MD  Primary Cardiologist:  Norman Herrlich, MD  HPI:  Edward Curtis is a 65 y.o. male who is being seen today for the evaluation of dyslipidemia at the request of [name], MD. This is a 65 year old male kindly referred by Dr. Dulce Sellar for evaluation and management of dyslipidemia.  He previously seen Laural Golden, PharmD in our lipid clinic and was started on Repatha.  This was following several medication changes including a number of statins both high and low potency that he did not tolerate as well as adding ezetimibe.  He said he had only been on ezetimibe for a couple of weeks and then started on Repatha.  He has a history of low back pain and also has a history of coronary disease with a stent in 2020.  He was previously followed by Dr. Donnie Aho.  After giving himself the first injection of Repatha he reported low back pain which worsened over the course of about a week and eventually improved.  He felt that this could be related since he looked up side effects of the medicine which included back pain and then did not pursue any more doses of the medication.  Prior to starting the medicine in July his total cholesterol 217, triglycerides 220 HDL 30 and LDL of 147 with a target LDL of less than 70 given his coronary disease history.  12/31/2020  [name] returns today for follow-up.  He initially thought that he had side effects with the Repatha causing back pain.  It might be because he said he continued to have some back pain issues however it seemed that it seem to be getting better and better after every dose.  The last dose he noted no side effects from it.  His cholesterol has responded significantly.  His total is now 95, triglycerides 170, HDL 35 and LDL 32 (decreased from 147).  He remains on ezetimibe.  He is concerned because he is recently gained about 50 pounds.  He is now  scheduled to start a commercial weight loss program with weight watchers.  He is hoping to get that weight down in about 6 months.  PMHx:  Past Medical History:  Diagnosis Date  . Allergy    seasonal allergies   . Arthritis   . Asthma   . Blood transfusion without reported diagnosis 1985  . Calcium oxalate renal stones    early 1990's  . Chicken pox   . Chronic neck pain   . Depression   . GERD (gastroesophageal reflux disease)   . Hyperlipidemia 10/10/2013  . Hypertrophic obstructive cardiomyopathy(425.11) 10/10/2013  . IBS (irritable bowel syndrome) 12/07/2011  . Migraines   . Neuromuscular disorder (HCC)    radiculopathy left side   . Obesity (BMI 30-39.9) 10/10/2013  . Seasonal allergies     Past Surgical History:  Procedure Laterality Date  . CORONARY STENT INTERVENTION N/A 03/23/2019   Procedure: CORONARY STENT INTERVENTION;  Surgeon: Marykay Lex, MD;  Location[facility] INVASIVE CV LAB;  Service: Cardiovascular;  Laterality: N/A;  . LEFT HEART CATH AND CORONARY ANGIOGRAPHY N/A 03/23/2019   Procedure: LEFT HEART CATH AND CORONARY ANGIOGRAPHY;  Surgeon: Marykay Lex, MD;  Location: [facility] INVASIVE CV LAB;  Service: Cardiovascular;  Laterality: N/A;  . left knee surgery  1990  . MOUTH SURGERY  1975/1993  . pre cancerous mole  2013  . prostate procedure  1980  . umbilical cyst  1985    FAMHx:  Family History  Problem Relation Age of Onset  . Arthritis Mother   . Hyperlipidemia Mother   . Heart disease Mother   . Hypertension Mother   . Heart disease Father   . Hypertension Father   . Diabetes Father   . Arthritis Paternal Grandmother   . Diabetes Paternal Grandmother   . Colon cancer Paternal Grandfather   . Heart disease Paternal Grandfather   . Diabetes Paternal Grandfather   . Hyperlipidemia Maternal Grandmother   . Hypertension Other        both sets of parents  . Esophageal cancer Neg Hx   . Rectal cancer Neg Hx   . Stomach cancer Neg Hx     SOCHx:    reports that he has never smoked. He has never used smokeless tobacco. He reports current alcohol use of about 1.0 standard drink of alcohol per week. He reports that he does not use drugs.  ALLERGIES:  Allergies  Allergen Reactions  . Ciprofloxacin Other (See Comments)    Possible "leg swelling, pain, unable to walk"  . Iodinated Diagnostic Agents Anaphylaxis, Hives, Itching and Swelling    Patient must be premedicated with 13 hour protocol prior to IV contrast administration  . Penicillins Hives    Did it involve swelling of the face/tongue/throat, SOB, or low BP? No Did it involve sudden or severe rash/hives, skin peeling, or any reaction on the inside of your mouth or nose? Yes Did you need to seek medical attention at a hospital or doctor's office? Yes When did it last happen?25 + years ago If all above answers are "NO", may proceed with cephalosporin use.   . Atorvastatin     myaglias  . Clindamycin/Lincomycin Diarrhea and Nausea Only  . Erythromycin Diarrhea and Nausea Only  . Ibuprofen Other (See Comments)    Causes tachycardia  . Keflex [Cephalexin]     Rash hives  . Other Itching and Other (See Comments)     Walnut trees and walnuts , sores in mouth  . Rosuvastatin     myaligias  . Sulfa Antibiotics Hives  . Latex Rash    Developed rash 06/2017 that progressed.  Allergy testing positive for latex allergy.  Later determined rash was 2/2 latex in [employer] foods.    ROS: Pertinent items noted in HPI and remainder of comprehensive ROS otherwise negative.  HOME MEDS: Current Outpatient Medications on File Prior to Visit  Medication Sig Dispense Refill  . acetaminophen (TYLENOL) 500 MG tablet Take 1,000 mg by mouth daily as needed for moderate pain.     [name] aspirin EC 81 MG tablet Take 81 mg by mouth daily.    . Cyanocobalamin (VITAMIN B 12) 500 MCG TABS Take 500 mcg by mouth daily.    . diphenhydrAMINE (BENADRYL) 25 MG tablet Take 25 mg by mouth daily as needed  (allergic reactions).    . EPINEPHrine (EPIPEN 2-PAK) 0.3 mg/0.3 mL IJ SOAJ injection Inject 0.3 mLs (0.3 mg total) into the muscle once as needed (for severe allergic reaction). CAll 911 immediately if you have to use this medicine 1 each 1  . Evolocumab (REPATHA SURECLICK) 140 MG/ML SOAJ Inject 140 mg into the skin every 14 (fourteen) days. 2 pen 11  . ezetimibe (ZETIA) 10 MG tablet TAKE ONE TABLET EACH DAY 90 tablet 3  . Melatonin 1 MG CHEW Chew 1 mg by mouth daily.    . nitroGLYCERIN (NITROSTAT) 0.4  MG SL tablet ONE TABLET UNDER TONGUE EVERY 5 MINUTES AS NEEDED FOR CHEST PAIN 75 tablet 1  . oxyCODONE-acetaminophen (PERCOCET/ROXICET) 5-325 MG tablet Take 1 tablet by mouth daily as needed for severe pain.     . polyethylene glycol (MIRALAX / GLYCOLAX) 17 g packet Take 17 g by mouth daily as needed for moderate constipation.    . valACYclovir (VALTREX) 500 MG tablet Take 500 mg by mouth daily as needed (cold sores). Take for 5 days at first sign of outbreak    . verapamil (CALAN) 80 MG tablet TAKE ONE TABLET DAILY AS NEEDED 90 tablet 2  . verapamil (CALAN-SR) 180 MG CR tablet TAKE ONE TABLET DAILY 90 tablet 3   No current facility-administered medications on file prior to visit.    LABS/IMAGING: No results found for this or any previous visit (from the past 48 hour(s)). No results found.  LIPID PANEL:    Component Value Date/Time   CHOL 95 (L) 12/19/2020 0806   TRIG 170 (H) 12/19/2020 0806   HDL 35 (L) 12/19/2020 0806   CHOLHDL 2.7 12/19/2020 0806   CHOLHDL 9 09/04/2014 0926   VLDL 63.4 (H) 09/04/2014 0926   LDLCALC 32 12/19/2020 0806   LDLDIRECT 151.2 09/04/2014 0926    WEIGHTS: Wt Readings from Last 3 Encounters:  12/31/20 229 lb (103.9 kg)  09/26/20 229 lb 6.4 oz (104.1 kg)  05/05/20 223 lb 9.6 oz (101.4 kg)    VITALS: BP 120/80 (BP Location: Left Arm, Patient Position: Sitting)   Pulse 93   Ht 5\' 8"  (1.727 m)   Wt 229 lb (103.9 kg)   SpO2 98%   BMI 34.82 kg/m    EXAM: Deferred  EKG: Deferred  ASSESSMENT: 1. CAD status post PCI in 2020 2. Mixed dyslipidemia, goal LDL less than 70 3. Statin intolerance-myalgias 4. Chronic low back pain  PLAN: 1.   Mr.Larke had an excellent response to Repatha with marked improvement in his lipids and LDL now at 32.  I advised him to continue with both ezetimibe and Repatha for the time being however if he loses the intended weight over the next several months, it is likely he can come off of the ezetimibe.  We will plan repeat lipids in about 6 months and follow-up at that time.  2021, MD, [facility], FACP  [place]  [facility]  Medical Director of the Advanced Lipid Disorders &  Cardiovascular Risk Reduction Clinic Diplomate of the American Board of Clinical Lipidology Attending Cardiologist  Direct Dial: [phone]  Fax: [phone]  Website:  [email or web address] Jaxston Chohan 12/31/2020, 4:12 PM

## 2021-01-14 DIAGNOSIS — F32A Depression, unspecified: Secondary | ICD-10-CM | POA: Insufficient documentation

## 2021-01-14 DIAGNOSIS — N200 Calculus of kidney: Secondary | ICD-10-CM | POA: Insufficient documentation

## 2021-01-14 DIAGNOSIS — G8929 Other chronic pain: Secondary | ICD-10-CM | POA: Insufficient documentation

## 2021-01-14 DIAGNOSIS — G629 Polyneuropathy, unspecified: Secondary | ICD-10-CM | POA: Insufficient documentation

## 2021-01-14 DIAGNOSIS — G43909 Migraine, unspecified, not intractable, without status migrainosus: Secondary | ICD-10-CM | POA: Insufficient documentation

## 2021-01-14 DIAGNOSIS — T7840XA Allergy, unspecified, initial encounter: Secondary | ICD-10-CM | POA: Insufficient documentation

## 2021-01-14 DIAGNOSIS — B019 Varicella without complication: Secondary | ICD-10-CM | POA: Insufficient documentation

## 2021-01-14 DIAGNOSIS — J302 Other seasonal allergic rhinitis: Secondary | ICD-10-CM | POA: Insufficient documentation

## 2021-01-14 DIAGNOSIS — K219 Gastro-esophageal reflux disease without esophagitis: Secondary | ICD-10-CM | POA: Insufficient documentation

## 2021-01-14 DIAGNOSIS — J45909 Unspecified asthma, uncomplicated: Secondary | ICD-10-CM | POA: Insufficient documentation

## 2021-01-14 DIAGNOSIS — G709 Myoneural disorder, unspecified: Secondary | ICD-10-CM | POA: Insufficient documentation

## 2021-01-14 DIAGNOSIS — M1990 Unspecified osteoarthritis, unspecified site: Secondary | ICD-10-CM | POA: Insufficient documentation

## 2021-02-02 NOTE — Progress Notes (Signed)
Cardiology Office Note:    Date:  02/03/2021   ID:  Edward Curtis, DOB 08/08/1956, MRN [id number]  PCP:  Edward Salisbury, MD  Cardiologist:  Edward Herrlich, MD    Referring MD: Edward Salisbury, MD    ASSESSMENT:    1. Coronary artery disease involving native coronary artery of native heart with angina pectoris (HCC)   2. Mixed hyperlipidemia   3. Palpitations   4. Erectile dysfunction, unspecified erectile dysfunction type    PLAN:    In order of problems listed above:  1. He continues to do well since he has had PCI and stent maintained on aspirin calcium channel blocker lipid-lowering with Repatha Zetia and followed in lipid clinic his LDL is ideal triglycerides are mildly elevated and would be a candidate in the future if needed icosapent ethyl 2. Managed by Dr. Rennis Curtis lipid clinic 3. If worsen contact me will arrange for an event monitor I encouraged him to purchase the iPhone mobile adapter to capture episodes and send him through my chart 4. Cialis is appropriate prescription given he understands not to take nitroglycerin for 24 hours after administration   Next appointment: 1 year although he may transition to Dr. Rennis Curtis in the interim   Medication Adjustments/Labs and Tests Ordered: Current medicines are reviewed at length with the patient today.  Concerns regarding medicines are outlined above.  Orders Placed This Encounter  Procedures  . EKG 12-Lead   Meds ordered this encounter  Medications  . tadalafil (CIALIS) 5 MG tablet    Sig: Take 1 tablet (5 mg total) by mouth daily as needed for erectile dysfunction.    Dispense:  10 tablet    Refill:  0    Chief Complaint  Patient presents with  . Annual Exam    History of Present Illness:    Edward Curtis is a 65 y.o. male with a hx of CAD PCI of his LAD 03/23/2019 incomplete right bundle branch block and hyperlipidemia with statin intolerance refer to lipid clinic regarding PCSK9 therapy. He was last seen  01/02/2020. Compliance with diet, lifestyle and medications: Yes  Overall doing well engaged in weight watchers losing weight and is pleased with the quality of his life. He has had no chest pain edema shortness of breath orthopnea or syncope. When he first started weight watchers he was having palpitation at nighttime and is a very infrequent now and not severe.  We discussed applying a monitor prerenal hold off unless it is worsened and to consider purchasing the mobile EKG monitor for smart phone.  He seen to cardiologist in the same group I told him to consider transition to Dr. Rennis Curtis is his cardiologist and lipid specialist.  He was recently seen lipid clinic he takes PCSK9 inhibitor and Zetia and his most recent lipid profile had a total cholesterol 95 triglycerides 170 LDL 32 decreased from 147 HDL 35. Past Medical History:  Diagnosis Date  . Allergy    seasonal allergies   . Arthritis   . Asthma   . Blood transfusion without reported diagnosis 1985  . Calcium oxalate renal stones    early 1990's  . Chicken pox   . Chronic neck pain   . Depression   . GERD (gastroesophageal reflux disease)   . Hyperlipidemia 10/10/2013  . Hypertrophic obstructive cardiomyopathy(425.11) 10/10/2013  . IBS (irritable bowel syndrome) 12/07/2011  . Migraines   . Neuromuscular disorder (HCC)    radiculopathy left side   . Obesity (BMI 30-39.9) 10/10/2013  .  Seasonal allergies     Past Surgical History:  Procedure Laterality Date  . CATARACT EXTRACTION Left 10/2020  . CORONARY STENT INTERVENTION N/A 03/23/2019   Procedure: CORONARY STENT INTERVENTION;  Surgeon: Edward Lex, MD;  Location: [facility] INVASIVE CV LAB;  Service: Cardiovascular;  Laterality: N/A;  . LEFT HEART CATH AND CORONARY ANGIOGRAPHY N/A 03/23/2019   Procedure: LEFT HEART CATH AND CORONARY ANGIOGRAPHY;  Surgeon: Edward Lex, MD;  Location: [facility] INVASIVE CV LAB;  Service: Cardiovascular;  Laterality: N/A;  . left knee surgery   1990  . MOUTH SURGERY  1975/1993  . pre cancerous mole  2013  . prostate procedure  1980  . umbilical cyst  1985    Current Medications: Current Meds  Medication Sig  . acetaminophen (TYLENOL) 500 MG tablet Take 1,000 mg by mouth daily as needed for moderate pain.   [name] aspirin EC 81 MG tablet Take 81 mg by mouth daily.  . Cyanocobalamin (VITAMIN B 12) 500 MCG TABS Take 500 mcg by mouth daily.  . diphenhydrAMINE (BENADRYL) 25 MG tablet Take 25 mg by mouth daily as needed (allergic reactions).  . EPINEPHrine (EPIPEN 2-PAK) 0.3 mg/0.3 mL IJ SOAJ injection Inject 0.3 mLs (0.3 mg total) into the muscle once as needed (for severe allergic reaction). CAll 911 immediately if you have to use this medicine  . Evolocumab (REPATHA SURECLICK) 140 MG/ML SOAJ Inject 140 mg into the skin every 14 (fourteen) days.  [name] ezetimibe (ZETIA) 10 MG tablet TAKE ONE TABLET EACH DAY  . Melatonin 1 MG CHEW Chew 1 mg by mouth daily.  . nitroGLYCERIN (NITROSTAT) 0.4 MG SL tablet ONE TABLET UNDER TONGUE EVERY 5 MINUTES AS NEEDED FOR CHEST PAIN  . oxyCODONE-acetaminophen (PERCOCET/ROXICET) 5-325 MG tablet Take 1 tablet by mouth daily as needed for severe pain.   . polyethylene glycol (MIRALAX / GLYCOLAX) 17 g packet Take 17 g by mouth daily as needed for moderate constipation.  . tadalafil (CIALIS) 5 MG tablet Take 1 tablet (5 mg total) by mouth daily as needed for erectile dysfunction.  . valACYclovir (VALTREX) 500 MG tablet Take 500 mg by mouth daily as needed (cold sores). Take for 5 days at first sign of outbreak  . verapamil (CALAN) 80 MG tablet TAKE ONE TABLET DAILY AS NEEDED  . verapamil (CALAN-SR) 180 MG CR tablet TAKE ONE TABLET DAILY     Allergies:   Ciprofloxacin, Iodinated diagnostic agents, Penicillins, Atorvastatin, Clindamycin/lincomycin, Erythromycin, Ibuprofen, Keflex [cephalexin], Other, Rosuvastatin, Sulfa antibiotics, and Latex   Social History   Socioeconomic History  . Marital status: Single     Spouse name: Not on file  . Number of children: Not on file  . Years of education: Not on file  . Highest education level: Not on file  Occupational History  . Not on file  Tobacco Use  . Smoking status: Never Smoker  . Smokeless tobacco: Never Used  Vaping Use  . Vaping Use: Never used  Substance and Sexual Activity  . Alcohol use: Yes    Alcohol/week: 1.0 standard drink    Types: 1 Glasses of wine per week    Comment: once a month  . Drug use: No  . Sexual activity: Not Currently  Other Topics Concern  . Not on file  Social History Narrative  . Not on file   Social Determinants of Health   Financial Resource Strain: Not on file  Food Insecurity: Not on file  Transportation Needs: Not on file  Physical Activity: Not  on file  Stress: Not on file  Social Connections: Not on file     Family History: The patient's family history includes Arthritis in his mother and paternal grandmother; Colon cancer in his paternal grandfather; Diabetes in his father, paternal grandfather, and paternal grandmother; Heart disease in his father, mother, and paternal grandfather; Hyperlipidemia in his maternal grandmother and mother; Hypertension in his father, mother, and another family member. There is no history of Esophageal cancer, Rectal cancer, or Stomach cancer. ROS:   Please see the history of present illness.    All other systems reviewed and are negative.  EKGs/Labs/Other Studies Reviewed:    The following studies were reviewed today:  EKG:  EKG ordered today and personally reviewed.  The ekg ordered today demonstrates sinus rhythm he has a normal EKG  Recent Labs: 05/02/2020: ALT 21; BUN 10; Creatinine, Ser 1.03; Potassium 4.1; Sodium 142 05/05/2020: TSH 4.98  Recent Lipid Panel    Component Value Date/Time   CHOL 95 (L) 12/19/2020 0806   TRIG 170 (H) 12/19/2020 0806   HDL 35 (L) 12/19/2020 0806   CHOLHDL 2.7 12/19/2020 0806   CHOLHDL 9 09/04/2014 0926   VLDL 63.4 (H)  09/04/2014 0926   LDLCALC 32 12/19/2020 0806   LDLDIRECT 151.2 09/04/2014 0926    Physical Exam:    VS:  BP 112/80 (BP Location: Left Arm, Patient Position: Sitting, Cuff Size: Normal)   Pulse 83   Ht 5\' 8"  (1.727 m)   Wt 214 lb (97.1 kg)   SpO2 97%   BMI 32.54 kg/m     Wt Readings from Last 3 Encounters:  02/03/21 214 lb (97.1 kg)  12/31/20 229 lb (103.9 kg)  09/26/20 229 lb 6.4 oz (104.1 kg)     GEN:  Well nourished, well developed in no acute distress HEENT: Normal NECK: No JVD; No carotid bruits LYMPHATICS: No lymphadenopathy CARDIAC: RRR, no murmurs, rubs, gallops RESPIRATORY:  Clear to auscultation without rales, wheezing or rhonchi  ABDOMEN: Soft, non-tender, non-distended MUSCULOSKELETAL:  No edema; No deformity  SKIN: Warm and dry NEUROLOGIC:  Alert and oriented x 3 PSYCHIATRIC:  Normal affect    Signed, 14/03/21, MD  02/03/2021 11:44 AM    [facility]

## 2021-02-03 ENCOUNTER — Encounter: Payer: Self-pay | Admitting: Cardiology

## 2021-02-03 ENCOUNTER — Ambulatory Visit (INDEPENDENT_AMBULATORY_CARE_PROVIDER_SITE_OTHER): Payer: Medicare Other | Admitting: Cardiology

## 2021-02-03 ENCOUNTER — Other Ambulatory Visit: Payer: Self-pay

## 2021-02-03 VITALS — BP 112/80 | HR 83 | Ht 68.0 in | Wt 214.0 lb

## 2021-02-03 DIAGNOSIS — R002 Palpitations: Secondary | ICD-10-CM

## 2021-02-03 DIAGNOSIS — I25119 Atherosclerotic heart disease of native coronary artery with unspecified angina pectoris: Secondary | ICD-10-CM | POA: Diagnosis not present

## 2021-02-03 DIAGNOSIS — E782 Mixed hyperlipidemia: Secondary | ICD-10-CM | POA: Diagnosis not present

## 2021-02-03 DIAGNOSIS — N529 Male erectile dysfunction, unspecified: Secondary | ICD-10-CM

## 2021-02-03 MED ORDER — TADALAFIL 5 MG PO TABS: 5.0000 mg | ORAL_TABLET | Freq: Every day | ORAL | 0 refills | Status: DC | PRN

## 2021-02-03 NOTE — Patient Instructions (Addendum)
Medication Instructions:  Your physician has recommended you make the following change in your medication:  START: Cialis 5 mg take one tablet by mouth daily as needed.  *If you need a refill on your cardiac medications before your next appointment, please call your pharmacy*   Lab Work: None If you have labs (blood work) drawn today and your tests are completely normal, you will receive your results only by: [name] MyChart Message (if you have MyChart) OR . A paper copy in the mail If you have any lab test that is abnormal or we need to change your treatment, we will call you to review the results.   Testing/Procedures: None   Follow-Up: At [facility], you and your health needs are our priority.  As part of our continuing mission to provide you with exceptional heart care, we have created designated Provider Care Teams.  These Care Teams include your primary Cardiologist (physician) and Advanced Practice Providers (APPs -  Physician Assistants and Nurse Practitioners) who all work together to provide you with the care you need, when you need it.  We recommend signing up for the patient portal called "MyChart".  Sign up information is provided on this After Visit Summary.  MyChart is used to connect with patients for Virtual Visits (Telemedicine).  Patients are able to view lab/test results, encounter notes, upcoming appointments, etc.  Non-urgent messages can be sent to your provider as well.   To learn more about what you can do with MyChart, go to [email or web address].    Your next appointment:     The format for your next appointment:   In Person  Provider:   Norman Herrlich, MD   Other Instructions  KardiaMobile [email or web address]        FDA-cleared, clinical grade mobile EKG monitor: Lourena Simmonds is the most clinically-validated mobile EKG used by the world's leading cardiac care medical professionals With Basic service, know instantly if your  heart rhythm is normal or if atrial fibrillation is detected, and email the last single EKG recording to yourself or your doctor Premium service, available for purchase through the Kardia app for $9.99 per month or $99 per year, includes unlimited history and storage of your EKG recordings, a monthly EKG summary report to share with your doctor, along with the ability to track your blood pressure, activity and weight Includes one KardiaMobile phone clip FREE SHIPPING: Standard delivery 1-3 business days. Orders placed by 11:00am PST will ship that afternoon. Otherwise, will ship next business day. All orders ship via [employer] from [place]

## 2021-03-31 ENCOUNTER — Encounter: Payer: Self-pay | Admitting: Family Medicine

## 2021-03-31 ENCOUNTER — Other Ambulatory Visit: Payer: Self-pay

## 2021-03-31 ENCOUNTER — Ambulatory Visit (INDEPENDENT_AMBULATORY_CARE_PROVIDER_SITE_OTHER): Payer: Medicare Other | Admitting: Family Medicine

## 2021-03-31 VITALS — BP 118/78 | HR 90 | Temp 98.5°F | Wt 200.0 lb

## 2021-03-31 DIAGNOSIS — I25119 Atherosclerotic heart disease of native coronary artery with unspecified angina pectoris: Secondary | ICD-10-CM | POA: Diagnosis not present

## 2021-03-31 DIAGNOSIS — R109 Unspecified abdominal pain: Secondary | ICD-10-CM

## 2021-03-31 LAB — POC URINALSYSI DIPSTICK (AUTOMATED)
Bilirubin, UA: NEGATIVE
Blood, UA: NEGATIVE
Glucose, UA: NEGATIVE
Ketones, UA: NEGATIVE
Leukocytes, UA: NEGATIVE
Nitrite, UA: NEGATIVE
Protein, UA: NEGATIVE
Spec Grav, UA: 1.015 (ref 1.010–1.025)
Urobilinogen, UA: 1 E.U./dL
pH, UA: 6 (ref 5.0–8.0)

## 2021-03-31 MED ORDER — METHYLPREDNISOLONE ACETATE 80 MG/ML IJ SUSP
80.0000 mg | Freq: Once | INTRAMUSCULAR | Status: AC
  Administered 2021-03-31: 80 mg via INTRAMUSCULAR

## 2021-03-31 MED ORDER — METHYLPREDNISOLONE 4 MG PO TBPK: ORAL_TABLET | ORAL | 0 refills | Status: DC

## 2021-03-31 MED ORDER — METHYLPREDNISOLONE ACETATE 40 MG/ML IJ SUSP
40.0000 mg | Freq: Once | INTRAMUSCULAR | Status: AC
  Administered 2021-03-31: 40 mg via INTRAMUSCULAR

## 2021-03-31 MED ORDER — METHOCARBAMOL 500 MG PO TABS: 500.0000 mg | ORAL_TABLET | Freq: Four times a day (QID) | ORAL | 0 refills | Status: DC | PRN

## 2021-03-31 NOTE — Addendum Note (Signed)
Addended by: Carola Rhine on: 03/31/2021 02:49 PM   Modules accepted: Orders

## 2021-03-31 NOTE — Progress Notes (Signed)
   Subjective:    Patient ID: Edward Curtis, male    DOB: 1956-10-20, 65 y.o.   MRN: [id number]  HPI Here for one week of intermittent sharp burning pains that start in the lower back and then radiate around the right flank. No recent trauma, but they started the morning after he had carried a heavy ladder around in his yard. He has tried ice, Tylenol, and Aleve with mixed results. No fever or urinary issues. He has a hx of low back pain and he has seen Dr. Joaquim Nam in the past for steroid injections.   Review of Systems  Constitutional: Negative.   Respiratory: Negative.   Cardiovascular: Negative.   Gastrointestinal: Negative.   Genitourinary: Positive for flank pain. Negative for difficulty urinating, dysuria, frequency, hematuria and urgency.  Musculoskeletal: Positive for back pain.       Objective:   Physical Exam Constitutional:      General: He is not in acute distress.    Appearance: Normal appearance.  Cardiovascular:     Rate and Rhythm: Normal rate and regular rhythm.     Pulses: Normal pulses.     Heart sounds: Normal heart sounds.  Pulmonary:     Effort: Pulmonary effort is normal.     Breath sounds: Normal breath sounds.  Abdominal:     General: Abdomen is flat. Bowel sounds are normal. There is no distension.     Palpations: Abdomen is soft. There is no mass.     Tenderness: There is no abdominal tenderness. There is no right CVA tenderness, left CVA tenderness, guarding or rebound.     Hernia: No hernia is present.  Musculoskeletal:     Comments: He is tender along the right lower back with spasm present in this area. ROM is full but flexion and rotation are painful   Neurological:     Mental Status: He is alert.           Assessment & Plan:  Low back pain with right sided radicular pain. He is given a shot of DepoMedrol. He will follow this with a Medrol dose pack and Robaxin as needed. Recheck if not better in one week.  Gershon Crane, MD

## 2021-04-20 ENCOUNTER — Ambulatory Visit (INDEPENDENT_AMBULATORY_CARE_PROVIDER_SITE_OTHER): Payer: Medicare Other | Admitting: Family Medicine

## 2021-04-20 ENCOUNTER — Other Ambulatory Visit: Payer: Self-pay | Admitting: Cardiology

## 2021-04-20 ENCOUNTER — Other Ambulatory Visit: Payer: Self-pay

## 2021-04-20 ENCOUNTER — Encounter: Payer: Self-pay | Admitting: Family Medicine

## 2021-04-20 VITALS — BP 120/78 | HR 90 | Temp 98.1°F | Wt 192.1 lb

## 2021-04-20 DIAGNOSIS — L309 Dermatitis, unspecified: Secondary | ICD-10-CM | POA: Insufficient documentation

## 2021-04-20 DIAGNOSIS — I25119 Atherosclerotic heart disease of native coronary artery with unspecified angina pectoris: Secondary | ICD-10-CM

## 2021-04-20 MED ORDER — METHYLPREDNISOLONE 4 MG PO TBPK: ORAL_TABLET | ORAL | 0 refills | Status: DC

## 2021-04-20 NOTE — Telephone Encounter (Signed)
Rx approved and sent 

## 2021-04-20 NOTE — Progress Notes (Signed)
   Subjective:    Patient ID: Edward Curtis, male    DOB: [birth year], 65 y.o.   MRN: [id number]  HPI Here for 3 days of widespread patches of red itchy skin. No SOB or wheezing. He is taking Zyrtec in the mornings and Benadryl in the evenings.    Review of Systems  Constitutional: Negative.   Respiratory: Negative.    Cardiovascular: Negative.   Skin:  Positive for rash.      Objective:   Physical Exam Constitutional:      General: He is not in acute distress.    Appearance: Normal appearance.  Cardiovascular:     Rate and Rhythm: Normal rate and regular rhythm.     Pulses: Normal pulses.     Heart sounds: Normal heart sounds.  Pulmonary:     Effort: Pulmonary effort is normal.     Breath sounds: Normal breath sounds.  Skin:    Comments: Scattered patches of red maculopapular skin on both upper arms and the trunk   Neurological:     Mental Status: He is alert.          Assessment & Plan:  Eczema. He will continue the Zyrtec and Benadryl. Add a Medrol dose pack. Apply hydrocortisone cream to the areas TID.  Gershon Crane, MD

## 2021-04-21 ENCOUNTER — Telehealth: Payer: Self-pay | Admitting: Internal Medicine

## 2021-04-21 NOTE — Telephone Encounter (Signed)
Possibly reaction to repatha.  Thanks!

## 2021-04-21 NOTE — Telephone Encounter (Signed)
Called patient -will work on prior [place]. Doubt rash due to Repatha.  Dr Rexene Edison

## 2021-04-21 NOTE — Telephone Encounter (Signed)
Follow up  ° ° °Patient calling back to speak with a nurse  °

## 2021-04-21 NOTE — Telephone Encounter (Signed)
    Pt c/o medication issue:  1. Name of Medication: Evolocumab (REPATHA SURECLICK) 140 MG/ML SOAJ  2. How are you currently taking this medication (dosage and times per day)? Inject 140 mg into the skin every 14 (fourteen) days.  3. Are you having a reaction (difficulty breathing--STAT)?   4. What is your medication issue? Pt said last weekend he had an allergic reaction, he wast sure what causes it but his pcp said it from something he ate or medication he takes, he said he took his repatha last Sunday and the following Friday he started having red peachy rash on his arms and chest, he wanted to know what Dr. Rennis Golden thinks if this reaction from his repatha, he said his next dose for repatha is this sunday

## 2021-04-23 ENCOUNTER — Ambulatory Visit: Payer: Medicare Other

## 2021-04-24 ENCOUNTER — Encounter (HOSPITAL_BASED_OUTPATIENT_CLINIC_OR_DEPARTMENT_OTHER): Payer: Self-pay

## 2021-04-24 ENCOUNTER — Emergency Department (HOSPITAL_BASED_OUTPATIENT_CLINIC_OR_DEPARTMENT_OTHER): Payer: Medicare Other

## 2021-04-24 ENCOUNTER — Encounter: Payer: Self-pay | Admitting: Family Medicine

## 2021-04-24 ENCOUNTER — Ambulatory Visit (INDEPENDENT_AMBULATORY_CARE_PROVIDER_SITE_OTHER): Payer: Medicare Other | Admitting: Family Medicine

## 2021-04-24 ENCOUNTER — Emergency Department (HOSPITAL_BASED_OUTPATIENT_CLINIC_OR_DEPARTMENT_OTHER)
Admission: EM | Admit: 2021-04-24 | Discharge: 2021-04-24 | Disposition: A | Discharge status: Home / Self Care | Payer: Medicare Other | Attending: Emergency Medicine | Admitting: Emergency Medicine

## 2021-04-24 ENCOUNTER — Other Ambulatory Visit: Payer: Self-pay

## 2021-04-24 VITALS — BP 124/74 | HR 95 | Temp 98.0°F | Wt 191.2 lb

## 2021-04-24 DIAGNOSIS — R0789 Other chest pain: Secondary | ICD-10-CM | POA: Diagnosis not present

## 2021-04-24 DIAGNOSIS — R21 Rash and other nonspecific skin eruption: Secondary | ICD-10-CM | POA: Diagnosis not present

## 2021-04-24 DIAGNOSIS — J45909 Unspecified asthma, uncomplicated: Secondary | ICD-10-CM | POA: Insufficient documentation

## 2021-04-24 DIAGNOSIS — W57XXXA Bitten or stung by nonvenomous insect and other nonvenomous arthropods, initial encounter: Secondary | ICD-10-CM | POA: Insufficient documentation

## 2021-04-24 DIAGNOSIS — S40861A Insect bite (nonvenomous) of right upper arm, initial encounter: Secondary | ICD-10-CM | POA: Insufficient documentation

## 2021-04-24 DIAGNOSIS — Z91040 Latex allergy status: Secondary | ICD-10-CM | POA: Insufficient documentation

## 2021-04-24 DIAGNOSIS — I25119 Atherosclerotic heart disease of native coronary artery with unspecified angina pectoris: Secondary | ICD-10-CM | POA: Diagnosis not present

## 2021-04-24 DIAGNOSIS — T63461A Toxic effect of venom of wasps, accidental (unintentional), initial encounter: Secondary | ICD-10-CM

## 2021-04-24 DIAGNOSIS — Z7982 Long term (current) use of aspirin: Secondary | ICD-10-CM | POA: Insufficient documentation

## 2021-04-24 DIAGNOSIS — S4991XA Unspecified injury of right shoulder and upper arm, initial encounter: Secondary | ICD-10-CM | POA: Diagnosis present

## 2021-04-24 LAB — BASIC METABOLIC PANEL
Anion gap: 12 (ref 5–15)
BUN: 19 mg/dL (ref 8–23)
CO2: 21 mmol/L — ABNORMAL LOW (ref 22–32)
Calcium: 9.6 mg/dL (ref 8.9–10.3)
Chloride: 106 mmol/L (ref 98–111)
Creatinine, Ser: 0.91 mg/dL (ref 0.61–1.24)
GFR, Estimated: >60 mL/min (ref >60)
Glucose, Bld: 102 mg/dL — ABNORMAL HIGH (ref 70–99)
Potassium: 3.8 mmol/L (ref 3.5–5.1)
Sodium: 139 mmol/L (ref 135–145)

## 2021-04-24 LAB — CBC
HCT: 46.4 % (ref 39.0–52.0)
Hemoglobin: 16.2 g/dL (ref 13.0–17.0)
MCH: 31.7 pg (ref 26.0–34.0)
MCHC: 34.9 g/dL (ref 30.0–36.0)
MCV: 90.8 fL (ref 80.0–100.0)
Platelets: 167 10*3/uL (ref 150–400)
RBC: 5.11 MIL/uL (ref 4.22–5.81)
RDW: 13.6 % (ref 11.5–15.5)
WBC: 12.2 10*3/uL — ABNORMAL HIGH (ref 4.0–10.5)
nRBC: 0 % (ref 0.0–0.2)

## 2021-04-24 LAB — TROPONIN I (HIGH SENSITIVITY): Troponin I (High Sensitivity): 4 ng/L (ref <18)

## 2021-04-24 MED ORDER — FAMOTIDINE IN NACL 20-0.9 MG/50ML-% IV SOLN
20.0000 mg | Freq: Once | INTRAVENOUS | Status: AC
  Administered 2021-04-24: 20 mg via INTRAVENOUS
  Filled 2021-04-24: qty 50

## 2021-04-24 MED ORDER — HYDROCORTISONE 1 % EX CREA
TOPICAL_CREAM | Freq: Once | CUTANEOUS | Status: AC
  Administered 2021-04-24: 1 via TOPICAL
  Filled 2021-04-24: qty 28

## 2021-04-24 MED ORDER — DIPHENHYDRAMINE HCL 50 MG/ML IJ SOLN
12.5000 mg | Freq: Once | INTRAMUSCULAR | Status: AC
  Administered 2021-04-24: 12.5 mg via INTRAVENOUS
  Filled 2021-04-24: qty 1

## 2021-04-24 MED ORDER — PERMETHRIN 5 % EX CREA: 1.0000 "application " | TOPICAL_CREAM | Freq: Once | CUTANEOUS | 1 refills | Status: AC

## 2021-04-24 MED ORDER — ALUM & MAG HYDROXIDE-SIMETH 200-200-20 MG/5ML PO SUSP
30.0000 mL | Freq: Once | ORAL | Status: AC
  Administered 2021-04-24: 30 mL via ORAL
  Filled 2021-04-24: qty 30

## 2021-04-24 MED ORDER — METHYLPREDNISOLONE SODIUM SUCC 125 MG IJ SOLR
125.0000 mg | Freq: Once | INTRAMUSCULAR | Status: AC
  Administered 2021-04-24: 125 mg via INTRAVENOUS
  Filled 2021-04-24: qty 2

## 2021-04-24 NOTE — ED Notes (Signed)
States chest pain is decreasing.  Denies nausea and SOB

## 2021-04-24 NOTE — Patient Instructions (Signed)
Follow up with Dr Clent Ridges if rash not resolving over the next week.    Leave on the Elimite for 8-10 hours and then wash off  Be sure to wash bed linens and clothes you sleep in the day after treatment.

## 2021-04-24 NOTE — Progress Notes (Signed)
Established Patient Office Visit  Subjective:  Patient ID: Edward Curtis, male    DOB: 1956-05-30  Age: 65 y.o. MRN: [id number]  CC:  Chief Complaint  Patient presents with   Rash    Under side of both arms, very itchy, given prednisone     HPI Edward Curtis presents for skin rash involving both arms medially.  This occurred about a week ago.  He has been seen here earlier by primary provider and is currently on prednisone.  He does think this is reduce the inflammation somewhat.  His specific concern is whether he has scabies.  He states he had scabies years ago and this felt very similar.  He has intense pruritus at night.  Rash is confined predominantly to the arms.  He states that a coworker is currently being treated for scabies and they share some of the same disc space.  Patient denies any involvement of the lower extremities or waist or hands.  No recent change of detergents, soaps, or deodorant  Past Medical History:  Diagnosis Date   Allergy    seasonal allergies    Arthritis    Asthma    Blood transfusion without reported diagnosis 1985   Calcium oxalate renal stones    early 1990's   Chicken pox    Chronic neck pain    Depression    GERD (gastroesophageal reflux disease)    Hyperlipidemia 10/10/2013   Hypertrophic obstructive cardiomyopathy(425.11) 10/10/2013   IBS (irritable bowel syndrome) 12/07/2011   Migraines    Neuromuscular disorder (HCC)    radiculopathy left side    Obesity (BMI 30-39.9) 10/10/2013   Seasonal allergies     Past Surgical History:  Procedure Laterality Date   CATARACT EXTRACTION Left 10/2020   CORONARY STENT INTERVENTION N/A 03/23/2019   Procedure: CORONARY STENT INTERVENTION;  Surgeon: Marykay Lex, MD;  Location: [facility] INVASIVE CV LAB;  Service: Cardiovascular;  Laterality: N/A;   LEFT HEART CATH AND CORONARY ANGIOGRAPHY N/A 03/23/2019   Procedure: LEFT HEART CATH AND CORONARY ANGIOGRAPHY;  Surgeon: Marykay Lex, MD;  Location: [facility]  INVASIVE CV LAB;  Service: Cardiovascular;  Laterality: N/A;   left knee surgery  1990   MOUTH SURGERY  1975/1993   pre cancerous mole  2013   prostate procedure  1980   umbilical cyst  1985    Family History  Problem Relation Age of Onset   Arthritis Mother    Hyperlipidemia Mother    Heart disease Mother    Hypertension Mother    Heart disease Father    Hypertension Father    Diabetes Father    Arthritis Paternal Grandmother    Diabetes Paternal Grandmother    Colon cancer Paternal Grandfather    Heart disease Paternal Grandfather    Diabetes Paternal Grandfather    Hyperlipidemia Maternal Grandmother    Hypertension Other        both sets of parents   Esophageal cancer Neg Hx    Rectal cancer Neg Hx    Stomach cancer Neg Hx     Social History   Socioeconomic History   Marital status: Single    Spouse name: Not on file   Number of children: Not on file   Years of education: Not on file   Highest education level: Not on file  Occupational History   Not on file  Tobacco Use   Smoking status: Never   Smokeless tobacco: Never  Vaping Use   Vaping Use: Never used  Substance and Sexual Activity   Alcohol use: Yes    Alcohol/week: 1.0 standard drink    Types: 1 Glasses of wine per week    Comment: once a month   Drug use: No   Sexual activity: Not Currently  Other Topics Concern   Not on file  Social History Narrative   Not on file   Social Determinants of Health   Financial Resource Strain: Not on file  Food Insecurity: Not on file  Transportation Needs: Not on file  Physical Activity: Not on file  Stress: Not on file  Social Connections: Not on file  Intimate Partner Violence: Not on file    Outpatient Medications Prior to Visit  Medication Sig Dispense Refill   acetaminophen (TYLENOL) 500 MG tablet Take 1,000 mg by mouth daily as needed for moderate pain.      aspirin EC 81 MG tablet Take 81 mg by mouth daily.     Cyanocobalamin (VITAMIN B 12) 500  MCG TABS Take 500 mcg by mouth daily.     diphenhydrAMINE (BENADRYL) 25 MG tablet Take 25 mg by mouth daily as needed (allergic reactions).     EPINEPHrine (EPIPEN 2-PAK) 0.3 mg/0.3 mL IJ SOAJ injection Inject 0.3 mLs (0.3 mg total) into the muscle once as needed (for severe allergic reaction). CAll 911 immediately if you have to use this medicine 1 each 1   Evolocumab (REPATHA SURECLICK) 140 MG/ML SOAJ Inject 140 mg into the skin every 14 (fourteen) days. 2 pen 11   ezetimibe (ZETIA) 10 MG tablet TAKE ONE TABLET EACH DAY 90 tablet 3   Melatonin 1 MG CHEW Chew 1 mg by mouth daily.     methocarbamol (ROBAXIN) 500 MG tablet Take 1 tablet (500 mg total) by mouth every 6 (six) hours as needed for muscle spasms. 60 tablet 0   methylPREDNISolone (MEDROL DOSEPAK) 4 MG TBPK tablet As directed 21 tablet 0   nitroGLYCERIN (NITROSTAT) 0.4 MG SL tablet ONE TABLET UNDER TONGUE EVERY 5 MINUTES AS NEEDED FOR CHEST PAIN 75 tablet 1   oxyCODONE-acetaminophen (PERCOCET/ROXICET) 5-325 MG tablet Take 1 tablet by mouth daily as needed for severe pain.      polyethylene glycol (MIRALAX / GLYCOLAX) 17 g packet Take 17 g by mouth daily as needed for moderate constipation.     tadalafil (CIALIS) 5 MG tablet Take 1 tablet (5 mg total) by mouth daily as needed for erectile dysfunction. 10 tablet 0   valACYclovir (VALTREX) 500 MG tablet Take 500 mg by mouth daily as needed (cold sores). Take for 5 days at first sign of outbreak     verapamil (CALAN) 80 MG tablet TAKE ONE TABLET DAILY AS NEEDED 90 tablet 2   verapamil (CALAN-SR) 180 MG CR tablet TAKE ONE TABLET DAILY 90 tablet 3   No facility-administered medications prior to visit.    Allergies  Allergen Reactions   Ciprofloxacin Other (See Comments)    Possible "leg swelling, pain, unable to walk"   Iodinated Diagnostic Agents Anaphylaxis, Hives, Itching and Swelling    Patient must be premedicated with 13 hour protocol prior to IV contrast administration    Penicillins Hives    Did it involve swelling of the face/tongue/throat, SOB, or low BP? No Did it involve sudden or severe rash/hives, skin peeling, or any reaction on the inside of your mouth or nose? Yes Did you need to seek medical attention at a hospital or doctor's office? Yes When did it last happen?  25 + years ago If all above answers are "NO", may proceed with cephalosporin use.    Atorvastatin     myaglias   Clindamycin/Lincomycin Diarrhea and Nausea Only   Erythromycin Diarrhea and Nausea Only   Ibuprofen Other (See Comments)    Causes tachycardia   Keflex [Cephalexin]     Rash hives   Other Itching and Other (See Comments)     Walnut trees and walnuts , sores in mouth   Rosuvastatin     myaligias   Sulfa Antibiotics Hives   Latex Rash    Developed rash 06/2017 that progressed.  Allergy testing positive for latex allergy.  Later determined rash was 2/2 latex in [employer] foods.    ROS Review of Systems  Constitutional:  Negative for chills and fever.  Skin:  Positive for rash.     Objective:    Physical Exam Vitals reviewed.  Constitutional:      Appearance: Normal appearance.  Cardiovascular:     Rate and Rhythm: Normal rate and regular rhythm.  Skin:    Findings: Rash present.     Comments: Has rash right and left arms medially with slightly raised erythematous maculopapular rash.  Nonscaly.  No vesicles.  No pustules.  No other rash seen  Neurological:     Mental Status: He is alert.    BP 124/74 (BP Location: Left Arm, Patient Position: Sitting, Cuff Size: Normal)   Pulse 95   Temp 98 F (36.7 C) (Oral)   Wt 191 lb 3.2 oz (86.7 kg)   SpO2 97%   BMI 29.07 kg/m  Wt Readings from Last 3 Encounters:  04/24/21 191 lb 3.2 oz (86.7 kg)  04/20/21 192 lb 2 oz (87.1 kg)  03/31/21 200 lb (90.7 kg)     Health Maintenance Due  Topic Date Due   Zoster Vaccines- Shingrix (1 of 2) Never done   PNA vac Low Risk Adult (1 of 2 - PCV13) Never done    COVID-19 Vaccine (3 - Moderna risk series) 03/28/2020    There are no preventive care reminders to display for this patient.  Lab Results  Component Value Date   TSH 4.98 (H) 05/05/2020   Lab Results  Component Value Date   WBC 8.3 10/05/2019   HGB 15.2 10/05/2019   HCT 44.3 10/05/2019   MCV 91.2 10/05/2019   PLT 206 10/05/2019   Lab Results  Component Value Date   NA 142 05/02/2020   K 4.1 05/02/2020   CO2 19 (L) 05/02/2020   GLUCOSE 96 05/02/2020   BUN 10 05/02/2020   CREATININE 1.03 05/02/2020   BILITOT 0.7 05/02/2020   ALKPHOS 72 05/02/2020   AST 22 05/02/2020   ALT 21 05/02/2020   PROT 6.6 05/02/2020   ALBUMIN 4.3 05/02/2020   CALCIUM 9.4 05/02/2020   ANIONGAP 9 10/05/2019   GFR 103.83 11/16/2017   Lab Results  Component Value Date   CHOL 95 (L) 12/19/2020   Lab Results  Component Value Date   HDL 35 (L) 12/19/2020   Lab Results  Component Value Date   LDLCALC 32 12/19/2020   Lab Results  Component Value Date   TRIG 170 (H) 12/19/2020   Lab Results  Component Value Date   CHOLHDL 2.7 12/19/2020   Lab Results  Component Value Date   HGBA1C 5.6 05/05/2020      Assessment & Plan:   Problem List Items Addressed This Visit   None Visit Diagnoses     Skin rash    -  Primary     Patient has intensely pruritic rash    patient had similar symptoms in the past with scabies.  Clinical suspicion for scabies relatively low but we agreed to go ahead and treat with course of Elimite cream with instructions given for use.  Follow-up with primary if rash not resolving over the next week or 2.  He is also reminded to wash bed linens and thinks he sleeps in the night that he treats and to be sure and leave on the Elimite for at least 8 to 10 hours before rinsing off thoroughly  Meds ordered this encounter  Medications   permethrin (ELIMITE) 5 % cream    Sig: Apply 1 application topically once for 1 dose.    Dispense:  60 g    Refill:  1    Follow-up:  No follow-ups on file.    Evelena Peat, MD

## 2021-04-24 NOTE — ED Provider Notes (Signed)
[facility] EMERGENCY DEPT Provider Note   CSN: [id number] Arrival date & time: 04/24/21  1123     History Chief Complaint  Patient presents with   Insect Bite    States has allergic reaction to such    Edward Curtis is a 65 y.o. male.  Pt presents to the ED today with multiple yellow jacket stings.  Pt said he is on a medrol dose pack which was started on 6/27 for eczema.  He was outside pulling up some weeds when he was stung by multiple yellow jackets to his right arm.  He took 1 benadryl pill pta.  He denies sob.  He has multiple allergies.      Past Medical History:  Diagnosis Date   Allergy    seasonal allergies    Arthritis    Asthma    Blood transfusion without reported diagnosis 1985   Calcium oxalate renal stones    early 1990's   Chicken pox    Chronic neck pain    Depression    GERD (gastroesophageal reflux disease)    Hyperlipidemia 10/10/2013   Hypertrophic obstructive cardiomyopathy(425.11) 10/10/2013   IBS (irritable bowel syndrome) 12/07/2011   Migraines    Neuromuscular disorder (HCC)    radiculopathy left side    Obesity (BMI 30-39.9) 10/10/2013   Seasonal allergies     Patient Active Problem List   Diagnosis Date Noted   Eczema 04/20/2021   Seasonal allergies    Neuromuscular disorder (HCC)    Migraines    GERD (gastroesophageal reflux disease)    Depression    Chronic neck pain    Chicken pox    Calcium oxalate renal stones    Asthma    Arthritis    Allergy    Myalgia due to statin 05/14/2020   Coronary artery disease involving native coronary artery of native heart with angina pectoris (HCC) 03/30/2019   Angina, class III (HCC) 03/16/2019    Class: Question of   Allergy to IVP dye 03/16/2019   Chest discomfort 03/08/2019   Edema 08/31/2018   Hypogonadism in male 11/25/2017   Scabies 08/20/2017   Nasal abrasion 08/20/2017   Anxiety and depression 11/12/2013   Prediabetes 11/12/2013   Obesity (BMI 30-39.9)  10/10/2013   Hyperlipidemia 10/10/2013   Hypertrophic cardiomyopathy (HCC)    IBS (irritable bowel syndrome) 12/07/2011   HIstory of SVT    Chronic back pain    Neck injury 11/25/2000   Blood transfusion without reported diagnosis 1985    Past Surgical History:  Procedure Laterality Date   CATARACT EXTRACTION Left 10/2020   CORONARY STENT INTERVENTION N/A 03/23/2019   Procedure: CORONARY STENT INTERVENTION;  Surgeon: Marykay Lex, MD;  Location: [facility] INVASIVE CV LAB;  Service: Cardiovascular;  Laterality: N/A;   LEFT HEART CATH AND CORONARY ANGIOGRAPHY N/A 03/23/2019   Procedure: LEFT HEART CATH AND CORONARY ANGIOGRAPHY;  Surgeon: Marykay Lex, MD;  Location: [facility] INVASIVE CV LAB;  Service: Cardiovascular;  Laterality: N/A;   left knee surgery  1990   MOUTH SURGERY  1975/1993   pre cancerous mole  2013   prostate procedure  1980   umbilical cyst  1985       Family History  Problem Relation Age of Onset   Arthritis Mother    Hyperlipidemia Mother    Heart disease Mother    Hypertension Mother    Heart disease Father    Hypertension Father    Diabetes Father    Arthritis Paternal Grandmother  Diabetes Paternal Grandmother    Colon cancer Paternal Grandfather    Heart disease Paternal Grandfather    Diabetes Paternal Grandfather    Hyperlipidemia Maternal Grandmother    Hypertension Other        both sets of parents   Esophageal cancer Neg Hx    Rectal cancer Neg Hx    Stomach cancer Neg Hx     Social History   Tobacco Use   Smoking status: Never   Smokeless tobacco: Never  Vaping Use   Vaping Use: Never used  Substance Use Topics   Alcohol use: Yes    Alcohol/week: 1.0 standard drink    Types: 1 Glasses of wine per week    Comment: once a month   Drug use: No    Home Medications Prior to Admission medications   Medication Sig Start Date End Date Taking? Authorizing Provider  acetaminophen (TYLENOL) 500 MG tablet Take 1,000 mg by mouth daily as  needed for moderate pain.     [provider]  aspirin EC 81 MG tablet Take 81 mg by mouth daily.    [provider]  Cyanocobalamin (VITAMIN B 12) 500 MCG TABS Take 500 mcg by mouth daily. 03/25/20   [provider]  diphenhydrAMINE (BENADRYL) 25 MG tablet Take 25 mg by mouth daily as needed (allergic reactions).    [provider]  EPINEPHrine (EPIPEN 2-PAK) 0.3 mg/0.3 mL IJ SOAJ injection Inject 0.3 mLs (0.3 mg total) into the muscle once as needed (for severe allergic reaction). CAll 911 immediately if you have to use this medicine 05/29/20   Nelwyn SalisburyFry, Stephen A, MD  Evolocumab (REPATHA SURECLICK) 140 MG/ML SOAJ Inject 140 mg into the skin every 14 (fourteen) days. 05/22/20   Baldo DaubMunley, Brian J, MD  ezetimibe (ZETIA) 10 MG tablet TAKE ONE TABLET EACH DAY 12/17/20   Baldo DaubMunley, Brian J, MD  Melatonin 1 MG CHEW Chew 1 mg by mouth daily. 10/26/19   [provider]  methocarbamol (ROBAXIN) 500 MG tablet Take 1 tablet (500 mg total) by mouth every 6 (six) hours as needed for muscle spasms. 03/31/21   Nelwyn SalisburyFry, Stephen A, MD  methylPREDNISolone (MEDROL DOSEPAK) 4 MG TBPK tablet As directed 04/20/21   Nelwyn SalisburyFry, Stephen A, MD  nitroGLYCERIN (NITROSTAT) 0.4 MG SL tablet ONE TABLET UNDER TONGUE EVERY 5 MINUTES AS NEEDED FOR CHEST PAIN 05/22/20   Baldo DaubMunley, Brian J, MD  oxyCODONE-acetaminophen (PERCOCET/ROXICET) 5-325 MG tablet Take 1 tablet by mouth daily as needed for severe pain.     [provider]  permethrin (ELIMITE) 5 % cream Apply 1 application topically once for 1 dose. 04/24/21 04/24/21  Burchette, Elberta FortisBruce W, MD  polyethylene glycol (MIRALAX / GLYCOLAX) 17 g packet Take 17 g by mouth daily as needed for moderate constipation.    [provider]  tadalafil (CIALIS) 5 MG tablet Take 1 tablet (5 mg total) by mouth daily as needed for erectile dysfunction. 02/03/21   Baldo DaubMunley, Brian J, MD  valACYclovir (VALTREX) 500 MG tablet Take 500 mg by mouth daily as needed (cold sores). Take for  5 days at first sign of outbreak    [provider]  verapamil (CALAN) 80 MG tablet TAKE ONE TABLET DAILY AS NEEDED 04/20/21   Baldo DaubMunley, Brian J, MD  verapamil (CALAN-SR) 180 MG CR tablet TAKE ONE TABLET DAILY 11/27/20   Baldo DaubMunley, Brian J, MD    Allergies    Ciprofloxacin, Iodinated diagnostic agents, Penicillins, Atorvastatin, Clindamycin/lincomycin, Erythromycin, Ibuprofen, Keflex [cephalexin], Other, Rosuvastatin, Sulfa  antibiotics, and Latex  Review of Systems   Review of Systems  Skin:        Multiple insect bites  All other systems reviewed and are negative.  Physical Exam Updated Vital Signs BP 124/80   Pulse 75   Temp 98.3 F (36.8 C) (Oral)   Resp 18   Wt 86.2 kg   SpO2 100%   BMI 28.89 kg/m   Physical Exam Vitals and nursing note reviewed.  Constitutional:      Appearance: Normal appearance.  HENT:     Head: Normocephalic and atraumatic.     Right Ear: External ear normal.     Left Ear: External ear normal.     Nose: Nose normal.     Mouth/Throat:     Mouth: Mucous membranes are moist.     Pharynx: Oropharynx is clear.  Eyes:     Extraocular Movements: Extraocular movements intact.     Conjunctiva/sclera: Conjunctivae normal.     Pupils: Pupils are equal, round, and reactive to light.  Cardiovascular:     Rate and Rhythm: Normal rate and regular rhythm.     Pulses: Normal pulses.     Heart sounds: Normal heart sounds.  Pulmonary:     Effort: Pulmonary effort is normal.  Abdominal:     General: Abdomen is flat. Bowel sounds are normal.     Palpations: Abdomen is soft.  Musculoskeletal:        General: Normal range of motion.     Cervical back: Normal range of motion and neck supple.  Skin:    Capillary Refill: Capillary refill takes less than 2 seconds.     Comments: 1 bite to right hand and 2 bites to upper right arm  Neurological:     General: No focal deficit present.     Mental Status: He is alert and oriented to person, place, and time.   Psychiatric:        Mood and Affect: Mood normal.        Behavior: Behavior normal.    ED Results / Procedures / Treatments   Labs (all labs ordered are listed, but only abnormal results are displayed) Labs Reviewed  BASIC METABOLIC PANEL - Abnormal; Notable for the following components:      Result Value   CO2 21 (*)    Glucose, Bld 102 (*)    All other components within normal limits  CBC - Abnormal; Notable for the following components:   WBC 12.2 (*)    All other components within normal limits  TROPONIN I (HIGH SENSITIVITY)  TROPONIN I (HIGH SENSITIVITY)    EKG None  Radiology DG Chest Port 1 View  Result Date: 04/24/2021 CLINICAL DATA:  Chest pain EXAM: PORTABLE CHEST 1 VIEW COMPARISON:  10/10/2013 FINDINGS: Thoracic spondylosis. Cardiac and mediastinal margins appear normal. Linear subsegmental atelectasis or scarring at the left lung base. The lungs appear otherwise clear. No blunting of the costophrenic angles. IMPRESSION: 1. Linear subsegmental atelectasis or scarring at the left lung base. 2. Thoracic spondylosis. Electronically Signed   By: Gaylyn Rong M.D.   On: 04/24/2021 13:36    Procedures Procedures   Medications Ordered in ED Medications  methylPREDNISolone sodium succinate (SOLU-MEDROL) 125 mg/2 mL injection 125 mg (125 mg Intravenous Given 04/24/21 1148)  diphenhydrAMINE (BENADRYL) injection 12.5 mg (12.5 mg Intravenous Given 04/24/21 1147)  famotidine (PEPCID) IVPB 20 mg premix (0 mg Intravenous Stopped 04/24/21 1256)  hydrocortisone cream 1 % (1 application Topical Given 04/24/21 1147)  alum & mag hydroxide-simeth (MAALOX/MYLANTA) 200-200-20 MG/5ML suspension 30 mL (30 mLs Oral Given 04/24/21 1302)    ED Course  I have reviewed the triage vital signs and the nursing notes.  Pertinent labs & imaging results that were available during my care of the patient were reviewed by me and considered in my medical decision making (see chart for details).    MDM  Rules/Calculators/A&P                          Pt told the nurse he was having cp.  I asked him if it started before or after meds and it was before.  It does not feel like prior heart cp.  Cardiac work up negative.  No evidence of anaphylaxis.  Pt is stable for d/c.  Return if worse. Final Clinical Impression(s) / ED Diagnoses Final diagnoses:  Yellow jacket sting, accidental or unintentional, initial encounter  Atypical chest pain    Rx / DC Orders ED Discharge Orders     None        Jacalyn Lefevre, MD 04/24/21 1341

## 2021-04-24 NOTE — Discharge Instructions (Addendum)
Continue Medrol Dose pack.  Take benadryl or zyrtec.

## 2021-04-24 NOTE — ED Triage Notes (Signed)
Stung right upper arm twice and once right hand.  Noted swelling/ redness to right upper inner arm.  Onset 15 minutes ago.  States they where yellow Jackets

## 2021-04-24 NOTE — ED Notes (Signed)
X-ray at bedside

## 2021-04-24 NOTE — ED Notes (Signed)
States now developing chest pain.  Radiates from right arm and across mid chest.  Denies SOB. or neasea

## 2021-04-24 NOTE — Telephone Encounter (Signed)
[place] CMA submitted PA on 04/20/21 - has not received outcome yet.

## 2021-04-25 ENCOUNTER — Emergency Department (HOSPITAL_BASED_OUTPATIENT_CLINIC_OR_DEPARTMENT_OTHER)
Admission: EM | Admit: 2021-04-25 | Discharge: 2021-04-26 | Disposition: A | Discharge status: Home / Self Care | Payer: Medicare Other | Attending: Emergency Medicine | Admitting: Emergency Medicine

## 2021-04-25 ENCOUNTER — Other Ambulatory Visit: Payer: Self-pay

## 2021-04-25 DIAGNOSIS — Z7982 Long term (current) use of aspirin: Secondary | ICD-10-CM | POA: Diagnosis not present

## 2021-04-25 DIAGNOSIS — T7840XD Allergy, unspecified, subsequent encounter: Secondary | ICD-10-CM | POA: Diagnosis present

## 2021-04-25 DIAGNOSIS — I2510 Atherosclerotic heart disease of native coronary artery without angina pectoris: Secondary | ICD-10-CM | POA: Insufficient documentation

## 2021-04-25 DIAGNOSIS — X58XXXA Exposure to other specified factors, initial encounter: Secondary | ICD-10-CM | POA: Diagnosis not present

## 2021-04-25 DIAGNOSIS — Z91040 Latex allergy status: Secondary | ICD-10-CM | POA: Diagnosis not present

## 2021-04-25 DIAGNOSIS — J45909 Unspecified asthma, uncomplicated: Secondary | ICD-10-CM | POA: Insufficient documentation

## 2021-04-25 MED ORDER — PREDNISONE 10 MG PO TABS: ORAL_TABLET | ORAL | 0 refills | Status: DC

## 2021-04-25 MED ORDER — FAMOTIDINE 20 MG PO TABS: 20.0000 mg | ORAL_TABLET | Freq: Two times a day (BID) | ORAL | 0 refills | Status: DC

## 2021-04-25 MED ORDER — FAMOTIDINE IN NACL 20-0.9 MG/50ML-% IV SOLN
20.0000 mg | Freq: Once | INTRAVENOUS | Status: AC
Start: 2021-04-25 — End: 2021-04-25
  Administered 2021-04-25: 20 mg via INTRAVENOUS
  Filled 2021-04-25: qty 50

## 2021-04-25 MED ORDER — METHYLPREDNISOLONE SODIUM SUCC 125 MG IJ SOLR
125.0000 mg | Freq: Once | INTRAMUSCULAR | Status: AC
Start: 2021-04-25 — End: 2021-04-25
  Administered 2021-04-25: 125 mg via INTRAVENOUS
  Filled 2021-04-25: qty 2

## 2021-04-25 NOTE — ED Triage Notes (Signed)
Pt to ED from home with c/o allergic reaction to yellow jacket stings that happened yesterday. Pt was evaluated yesterday and told to come back if he started to experience symptoms. Pt states that tonight at 2100 that his throat felt swollen and his eyes became very itchy and irritated. Pt took benadryl at home before arrival.

## 2021-04-25 NOTE — Discharge Instructions (Signed)
You were seen in the emergency department for worsening allergic reaction.  You were given medication and observed for period of time.  We have sent prescriptions to your pharmacy.  Please follow-up with your primary care doctor.  Return to the emergency department for any worsening or concerning symptoms

## 2021-04-25 NOTE — ED Provider Notes (Signed)
[facility] EMERGENCY DEPT Provider Note   CSN: [id number] Arrival date & time: 04/25/21  2215     History Chief Complaint  Patient presents with   Allergic Reaction    Edward Curtis is a 65 y.o. male.  He is a history of allergic reactions to multiple things.  He was here yesterday after getting stung by yellow jackets to his right arm and hand.  He received Solu-Medrol Pepcid Benadryl with improvement in his symptoms.  He was already on a Medrol Dosepak for an earlier rash.  He said he has 3 pills left.  Tonight he felt like he was getting some tingling in his throat and was concerned the allergic reaction was recurring.  He took some Benadryl at home and then drove here.  The history is provided by the patient.  Allergic Reaction Presenting symptoms: difficulty swallowing   Presenting symptoms: no rash   Severity:  Moderate Duration:  1 hour Prior allergic episodes:  Allergies to medications Context: insect bite/sting   Relieved by:  Nothing Worsened by:  Nothing Ineffective treatments:  Antihistamines     Past Medical History:  Diagnosis Date   Allergy    seasonal allergies    Arthritis    Asthma    Blood transfusion without reported diagnosis 1985   Calcium oxalate renal stones    early 1990's   Chicken pox    Chronic neck pain    Depression    GERD (gastroesophageal reflux disease)    Hyperlipidemia 10/10/2013   Hypertrophic obstructive cardiomyopathy(425.11) 10/10/2013   IBS (irritable bowel syndrome) 12/07/2011   Migraines    Neuromuscular disorder (HCC)    radiculopathy left side    Obesity (BMI 30-39.9) 10/10/2013   Seasonal allergies     Patient Active Problem List   Diagnosis Date Noted   Eczema 04/20/2021   Seasonal allergies    Neuromuscular disorder (HCC)    Migraines    GERD (gastroesophageal reflux disease)    Depression    Chronic neck pain    Chicken pox    Calcium oxalate renal stones    Asthma    Arthritis    Allergy     Myalgia due to statin 05/14/2020   Coronary artery disease involving native coronary artery of native heart with angina pectoris (HCC) 03/30/2019   Angina, class III (HCC) 03/16/2019    Class: Question of   Allergy to IVP dye 03/16/2019   Chest discomfort 03/08/2019   Edema 08/31/2018   Hypogonadism in male 11/25/2017   Scabies 08/20/2017   Nasal abrasion 08/20/2017   Anxiety and depression 11/12/2013   Prediabetes 11/12/2013   Obesity (BMI 30-39.9) 10/10/2013   Hyperlipidemia 10/10/2013   Hypertrophic cardiomyopathy (HCC)    IBS (irritable bowel syndrome) 12/07/2011   HIstory of SVT    Chronic back pain    Neck injury 11/25/2000   Blood transfusion without reported diagnosis 1985    Past Surgical History:  Procedure Laterality Date   CATARACT EXTRACTION Left 10/2020   CORONARY STENT INTERVENTION N/A 03/23/2019   Procedure: CORONARY STENT INTERVENTION;  Surgeon: Marykay Lex, MD;  Location: [facility] INVASIVE CV LAB;  Service: Cardiovascular;  Laterality: N/A;   LEFT HEART CATH AND CORONARY ANGIOGRAPHY N/A 03/23/2019   Procedure: LEFT HEART CATH AND CORONARY ANGIOGRAPHY;  Surgeon: Marykay Lex, MD;  Location: [facility] INVASIVE CV LAB;  Service: Cardiovascular;  Laterality: N/A;   left knee surgery  1990   MOUTH SURGERY  1975/1993   pre cancerous mole  2013   prostate procedure  1980   umbilical cyst  1985       Family History  Problem Relation Age of Onset   Arthritis Mother    Hyperlipidemia Mother    Heart disease Mother    Hypertension Mother    Heart disease Father    Hypertension Father    Diabetes Father    Arthritis Paternal Grandmother    Diabetes Paternal Grandmother    Colon cancer Paternal Grandfather    Heart disease Paternal Grandfather    Diabetes Paternal Grandfather    Hyperlipidemia Maternal Grandmother    Hypertension Other        both sets of parents   Esophageal cancer Neg Hx    Rectal cancer Neg Hx    Stomach cancer Neg Hx     Social  History   Tobacco Use   Smoking status: Never   Smokeless tobacco: Never  Vaping Use   Vaping Use: Never used  Substance Use Topics   Alcohol use: Yes    Alcohol/week: 1.0 standard drink    Types: 1 Glasses of wine per week    Comment: once a month   Drug use: No    Home Medications Prior to Admission medications   Medication Sig Start Date End Date Taking? Authorizing Provider  acetaminophen (TYLENOL) 500 MG tablet Take 1,000 mg by mouth daily as needed for moderate pain.     [provider]  aspirin EC 81 MG tablet Take 81 mg by mouth daily.    [provider]  Cyanocobalamin (VITAMIN B 12) 500 MCG TABS Take 500 mcg by mouth daily. 03/25/20   [provider]  diphenhydrAMINE (BENADRYL) 25 MG tablet Take 25 mg by mouth daily as needed (allergic reactions).    [provider]  EPINEPHrine (EPIPEN 2-PAK) 0.3 mg/0.3 mL IJ SOAJ injection Inject 0.3 mLs (0.3 mg total) into the muscle once as needed (for severe allergic reaction). CAll 911 immediately if you have to use this medicine 05/29/20   Nelwyn SalisburyFry, Stephen A, MD  Evolocumab (REPATHA SURECLICK) 140 MG/ML SOAJ Inject 140 mg into the skin every 14 (fourteen) days. 05/22/20   Baldo DaubMunley, Brian J, MD  ezetimibe (ZETIA) 10 MG tablet TAKE ONE TABLET EACH DAY 12/17/20   Baldo DaubMunley, Brian J, MD  Melatonin 1 MG CHEW Chew 1 mg by mouth daily. 10/26/19   [provider]  methocarbamol (ROBAXIN) 500 MG tablet Take 1 tablet (500 mg total) by mouth every 6 (six) hours as needed for muscle spasms. 03/31/21   Nelwyn SalisburyFry, Stephen A, MD  methylPREDNISolone (MEDROL DOSEPAK) 4 MG TBPK tablet As directed 04/20/21   Nelwyn SalisburyFry, Stephen A, MD  nitroGLYCERIN (NITROSTAT) 0.4 MG SL tablet ONE TABLET UNDER TONGUE EVERY 5 MINUTES AS NEEDED FOR CHEST PAIN 05/22/20   Baldo DaubMunley, Brian J, MD  oxyCODONE-acetaminophen (PERCOCET/ROXICET) 5-325 MG tablet Take 1 tablet by mouth daily as needed for severe pain.     [provider]  polyethylene glycol (MIRALAX /  GLYCOLAX) 17 g packet Take 17 g by mouth daily as needed for moderate constipation.    [provider]  tadalafil (CIALIS) 5 MG tablet Take 1 tablet (5 mg total) by mouth daily as needed for erectile dysfunction. 02/03/21   Baldo DaubMunley, Brian J, MD  valACYclovir (VALTREX) 500 MG tablet Take 500 mg by mouth daily as needed (cold sores). Take for 5 days at first sign of outbreak    [provider]  verapamil (CALAN) 80 MG tablet TAKE  ONE TABLET DAILY AS NEEDED 04/20/21   Baldo Daub, MD  verapamil (CALAN-SR) 180 MG CR tablet TAKE ONE TABLET DAILY 11/27/20   Baldo Daub, MD    Allergies    Ciprofloxacin, Iodinated diagnostic agents, Penicillins, Atorvastatin, Clindamycin/lincomycin, Erythromycin, Ibuprofen, Keflex [cephalexin], Other, Rosuvastatin, Sulfa antibiotics, and Latex  Review of Systems   Review of Systems  Constitutional:  Negative for fever.  HENT:  Positive for trouble swallowing.   Eyes:  Negative for visual disturbance.  Respiratory:  Negative for cough and shortness of breath.   Cardiovascular:  Negative for chest pain.  Gastrointestinal:  Negative for abdominal pain.  Genitourinary:  Negative for dysuria.  Musculoskeletal:  Negative for joint swelling.  Skin:  Negative for rash.  Neurological:  Negative for headaches.   Physical Exam Updated Vital Signs BP (!) 151/86 (BP Location: Right Arm)   Pulse 90   Temp 98.2 F (36.8 C) (Oral)   Resp 18   Ht 5\' 8"  (1.727 m)   Wt 86.2 kg   SpO2 100%   BMI 28.89 kg/m   Physical Exam Vitals and nursing note reviewed.  Constitutional:      Appearance: Normal appearance. He is well-developed.  HENT:     Head: Normocephalic and atraumatic.     Mouth/Throat:     Mouth: Mucous membranes are moist.     Pharynx: Oropharynx is clear.  Eyes:     Conjunctiva/sclera: Conjunctivae normal.  Neck:     Comments: No stridor Cardiovascular:     Rate and Rhythm: Normal rate and regular rhythm.     Heart sounds: No  murmur heard. Pulmonary:     Effort: Pulmonary effort is normal. No respiratory distress.     Breath sounds: Normal breath sounds.  Abdominal:     Palpations: Abdomen is soft.     Tenderness: There is no abdominal tenderness.  Musculoskeletal:        General: No deformity or signs of injury. Normal range of motion.     Cervical back: Neck supple.  Skin:    General: Skin is warm and dry.     Findings: No rash.  Neurological:     General: No focal deficit present.     Mental Status: He is alert.    ED Results / Procedures / Treatments   Labs (all labs ordered are listed, but only abnormal results are displayed) Labs Reviewed - No data to display  EKG None  Radiology DG Chest [facility] 1 View  Result Date: 04/24/2021 CLINICAL DATA:  Chest pain EXAM: PORTABLE CHEST 1 VIEW COMPARISON:  10/10/2013 FINDINGS: Thoracic spondylosis. Cardiac and mediastinal margins appear normal. Linear subsegmental atelectasis or scarring at the left lung base. The lungs appear otherwise clear. No blunting of the costophrenic angles. IMPRESSION: 1. Linear subsegmental atelectasis or scarring at the left lung base. 2. Thoracic spondylosis. Electronically Signed   By: 10/12/2013 M.D.   On: 04/24/2021 13:36    Procedures Procedures   Medications Ordered in ED Medications  methylPREDNISolone sodium succinate (SOLU-MEDROL) 125 mg/2 mL injection 125 mg (has no administration in time range)  famotidine (PEPCID) IVPB 20 mg premix (has no administration in time range)    ED Course  I have reviewed the triage vital signs and the nursing notes.  Pertinent labs & imaging results that were available during my care of the patient were reviewed by me and considered in my medical decision making (see chart for details).    MDM Rules/Calculators/A&P  65 year old male here with possible rebound allergic reaction symptoms.  He is otherwise well-appearing in no distress.  Have given him a  repeat dose of steroids and IV Pepcid.  He is already self treated with diphenhydramine at home.  No indications for epinephrine at this time.  He is care is signed out to oncoming provider Dr. Eudelia Bunch to observe patient's response to treatment.  If improving likely can be discharged to follow-up with his PCP.  Final Clinical Impression(s) / ED Diagnoses Final diagnoses:  Allergic reaction, subsequent encounter    Rx / DC Orders ED Discharge Orders          Ordered    famotidine (PEPCID) 20 MG tablet  2 times daily        04/25/21 2235    predniSONE (DELTASONE) 10 MG tablet        04/25/21 2235             Terrilee Files, MD 04/26/21 [phone]

## 2021-04-26 NOTE — ED Provider Notes (Signed)
I assumed care of this patient.  Please see previous provider note for further details of Hx, PE.  Briefly patient is a 65 y.o. male who presented with tingling and lump sensation in his throat.  He was treated here yesterday for allergic reaction after yellowjacket stings.  Received Solu-Medrol and Pepcid.  No need for epinephrine.  Plan is to monitor patient for additional 1 to 2 hours and reassess.  Patient symptoms are improving.  The patient appears reasonably screened and/or stabilized for discharge and I doubt any other medical condition or other [facility] requiring further screening, evaluation, or treatment in the ED at this time prior to discharge. Safe for discharge with strict return precautions.  Disposition: Discharge  Condition: Good  I have discussed the results, Dx and Tx plan with the patient/family who expressed understanding and agree(s) with the plan. Discharge instructions discussed at length. The patient/family was given strict return precautions who verbalized understanding of the instructions. No further questions at time of discharge.    ED Discharge Orders          Ordered    famotidine (PEPCID) 20 MG tablet  2 times daily        04/25/21 2235    predniSONE (DELTASONE) 10 MG tablet        04/25/21 2235            Follow Up: [name], MD [address] [place] [address] [phone]  Schedule an appointment as soon as possible for a visit  For recheck of your symptoms         Eudelia Bunch Amadeo Garnet, MD 04/26/21 [phone]

## 2021-05-01 ENCOUNTER — Inpatient Hospital Stay: Payer: Medicare Other | Admitting: Family Medicine

## 2021-05-06 ENCOUNTER — Telehealth: Payer: Self-pay | Admitting: Family Medicine

## 2021-05-06 NOTE — Telephone Encounter (Signed)
Left message for patient to call back and schedule Medicare Annual Wellness Visit (AWV) either virtually or in office.   awvi 12/23/20 per [employer]   please schedule at anytime with [facility] Nurse Health Advisor 1 or 2   This should be a 45 minute visit. 

## 2021-05-07 ENCOUNTER — Telehealth: Payer: Self-pay

## 2021-05-07 NOTE — Telephone Encounter (Signed)
[employer] appeal overturned and approved from 03/30/21-04/29/22 pt notified via mychart this just serves as a notification of approval.

## 2021-05-12 ENCOUNTER — Other Ambulatory Visit: Payer: Self-pay

## 2021-05-12 ENCOUNTER — Ambulatory Visit (INDEPENDENT_AMBULATORY_CARE_PROVIDER_SITE_OTHER): Payer: Medicare Other

## 2021-05-12 VITALS — BP 100/70 | HR 96 | Temp 98.4°F | Wt 192.5 lb

## 2021-05-12 DIAGNOSIS — Z0000 Encounter for general adult medical examination without abnormal findings: Secondary | ICD-10-CM

## 2021-05-12 NOTE — Patient Instructions (Signed)
Edward Curtis , Thank you for taking time to come for your Medicare Wellness Visit. I appreciate your ongoing commitment to your health goals. Please review the following plan we discussed and let me know if I can assist you in the future.   Screening recommendations/referrals: Colonoscopy: Will speak to dr Clent Ridges Recommended yearly ophthalmology/optometry visit for glaucoma screening and checkup Recommended yearly dental visit for hygiene and checkup  Vaccinations: Influenza vaccine: Due 05/25/21 Pneumococcal vaccine: due and discussed Tdap vaccine: Done 12/27/13 repeat in 10 years 12/28/23 Shingles vaccine: Shingrix discussed. Please contact your pharmacy for coverage information.    Covid-19: Completed 4/9 & 02/29/20  Advanced directives: Advance directive discussed with you today. Even though you declined this today please call our office should you change your mind and we can give you the proper paperwork for you to fill out.  Conditions/risks identified: Lose weight and start walking   Next appointment: Follow up in one year for your annual wellness visit.   Preventive Care 33 Years and Older, Male Preventive care refers to lifestyle choices and visits with your health care provider that can promote health and wellness. What does preventive care include? A yearly physical exam. This is also called an annual well check. Dental exams once or twice a year. Routine eye exams. Ask your health care provider how often you should have your eyes checked. Personal lifestyle choices, including: Daily care of your teeth and gums. Regular physical activity. Eating a healthy diet. Avoiding tobacco and drug use. Limiting alcohol use. Practicing safe sex. Taking low doses of aspirin every day. Taking vitamin and mineral supplements as recommended by your health care provider. What happens during an annual well check? The services and screenings done by your health care provider during your annual well  check will depend on your age, overall health, lifestyle risk factors, and family history of disease. Counseling  Your health care provider may ask you questions about your: Alcohol use. Tobacco use. Drug use. Emotional well-being. Home and relationship well-being. Sexual activity. Eating habits. History of falls. Memory and ability to understand (cognition). Work and work Astronomer. Screening  You may have the following tests or measurements: Height, weight, and BMI. Blood pressure. Lipid and cholesterol levels. These may be checked every 5 years, or more frequently if you are over 49 years old. Skin check. Lung cancer screening. You may have this screening every year starting at age 3 if you have a 30-pack-year history of smoking and currently smoke or have quit within the past 15 years. Fecal occult blood test (FOBT) of the stool. You may have this test every year starting at age 74. Flexible sigmoidoscopy or colonoscopy. You may have a sigmoidoscopy every 5 years or a colonoscopy every 10 years starting at age 15. Prostate cancer screening. Recommendations will vary depending on your family history and other risks. Hepatitis C blood test. Hepatitis B blood test. Sexually transmitted disease (STD) testing. Diabetes screening. This is done by checking your blood sugar (glucose) after you have not eaten for a while (fasting). You may have this done every 1-3 years. Abdominal aortic aneurysm (AAA) screening. You may need this if you are a current or former smoker. Osteoporosis. You may be screened starting at age 46 if you are at high risk. Talk with your health care provider about your test results, treatment options, and if necessary, the need for more tests. Vaccines  Your health care provider may recommend certain vaccines, such as: Influenza vaccine. This is recommended  every year. Tetanus, diphtheria, and acellular pertussis (Tdap, Td) vaccine. You may need a Td booster  every 10 years. Zoster vaccine. You may need this after age 70. Pneumococcal 13-valent conjugate (PCV13) vaccine. One dose is recommended after age 57. Pneumococcal polysaccharide (PPSV23) vaccine. One dose is recommended after age 3. Talk to your health care provider about which screenings and vaccines you need and how often you need them. This information is not intended to replace advice given to you by your health care provider. Make sure you discuss any questions you have with your health care provider. Document Released: 11/07/2015 Document Revised: 06/30/2016 Document Reviewed: 08/12/2015 Elsevier Interactive Patient Education  2017 [place] Prevention in the Home Falls can cause injuries. They can happen to people of all ages. There are many things you can do to make your home safe and to help prevent falls. What can I do on the outside of my home? Regularly fix the edges of walkways and driveways and fix any cracks. Remove anything that might make you trip as you walk through a door, such as a raised step or threshold. Trim any bushes or trees on the path to your home. Use bright outdoor lighting. Clear any walking paths of anything that might make someone trip, such as rocks or tools. Regularly check to see if handrails are loose or broken. Make sure that both sides of any steps have handrails. Any raised decks and porches should have guardrails on the edges. Have any leaves, snow, or ice cleared regularly. Use sand or salt on walking paths during winter. Clean up any spills in your garage right away. This includes oil or grease spills. What can I do in the bathroom? Use night lights. Install grab bars by the toilet and in the tub and shower. Do not use towel bars as grab bars. Use non-skid mats or decals in the tub or shower. If you need to sit down in the shower, use a plastic, non-slip stool. Keep the floor dry. Clean up any water that spills on the floor as soon  as it happens. Remove soap buildup in the tub or shower regularly. Attach bath mats securely with double-sided non-slip rug tape. Do not have throw rugs and other things on the floor that can make you trip. What can I do in the bedroom? Use night lights. Make sure that you have a light by your bed that is easy to reach. Do not use any sheets or blankets that are too big for your bed. They should not hang down onto the floor. Have a firm chair that has side arms. You can use this for support while you get dressed. Do not have throw rugs and other things on the floor that can make you trip. What can I do in the kitchen? Clean up any spills right away. Avoid walking on wet floors. Keep items that you use a lot in easy-to-reach places. If you need to reach something above you, use a strong step stool that has a grab bar. Keep electrical cords out of the way. Do not use floor polish or wax that makes floors slippery. If you must use wax, use non-skid floor wax. Do not have throw rugs and other things on the floor that can make you trip. What can I do with my stairs? Do not leave any items on the stairs. Make sure that there are handrails on both sides of the stairs and use them. Fix handrails that are broken  or loose. Make sure that handrails are as long as the stairways. Check any carpeting to make sure that it is firmly attached to the stairs. Fix any carpet that is loose or worn. Avoid having throw rugs at the top or bottom of the stairs. If you do have throw rugs, attach them to the floor with carpet tape. Make sure that you have a light switch at the top of the stairs and the bottom of the stairs. If you do not have them, ask someone to add them for you. What else can I do to help prevent falls? Wear shoes that: Do not have high heels. Have rubber bottoms. Are comfortable and fit you well. Are closed at the toe. Do not wear sandals. If you use a stepladder: Make sure that it is fully  opened. Do not climb a closed stepladder. Make sure that both sides of the stepladder are locked into place. Ask someone to hold it for you, if possible. Clearly mark and make sure that you can see: Any grab bars or handrails. First and last steps. Where the edge of each step is. Use tools that help you move around (mobility aids) if they are needed. These include: Canes. Walkers. Scooters. Crutches. Turn on the lights when you go into a dark area. Replace any light bulbs as soon as they burn out. Set up your furniture so you have a clear path. Avoid moving your furniture around. If any of your floors are uneven, fix them. If there are any pets around you, be aware of where they are. Review your medicines with your doctor. Some medicines can make you feel dizzy. This can increase your chance of falling. Ask your doctor what other things that you can do to help prevent falls. This information is not intended to replace advice given to you by your health care provider. Make sure you discuss any questions you have with your health care provider. Document Released: 08/07/2009 Document Revised: 03/18/2016 Document Reviewed: 11/15/2014 Elsevier Interactive Patient Education  2017 [employer].

## 2021-05-12 NOTE — Progress Notes (Addendum)
Subjective:   Edward Curtis is a 65 y.o. male who presents for an Initial Medicare Annual Wellness Visit.  Review of Systems     Cardiac Risk Factors include: advanced age (>[id number], 25>65 women);dyslipidemia;male gender     Objective:    Today's Vitals   05/12/21 0935  BP: 100/70  Pulse: 96  Temp: 98.4 F (36.9 C)  SpO2: 97%  Weight: 192 lb 8 oz (87.3 kg)   Body mass index is 29.27 kg/m.  Advanced Directives 05/12/2021 04/24/2021 10/05/2019 03/23/2019 08/28/2018 10/21/2014  Does Patient Have a Medical Advance Directive? No No No No No No  Would patient like information on creating a medical advance directive? Yes (MAU/Ambulatory/Procedural Areas - Information given) - - No - Patient declined - -    Current Medications (verified) Outpatient Encounter Medications as of 05/12/2021  Medication Sig   acetaminophen (TYLENOL) 500 MG tablet Take 1,000 mg by mouth daily as needed for moderate pain.    aspirin EC 81 MG tablet Take 81 mg by mouth daily.   Cyanocobalamin (VITAMIN B 12) 500 MCG TABS Take 500 mcg by mouth daily.   diphenhydrAMINE (BENADRYL) 25 MG tablet Take 25 mg by mouth daily as needed (allergic reactions).   EPINEPHrine (EPIPEN 2-PAK) 0.3 mg/0.3 mL IJ SOAJ injection Inject 0.3 mLs (0.3 mg total) into the muscle once as needed (for severe allergic reaction). CAll 911 immediately if you have to use this medicine   Evolocumab (REPATHA SURECLICK) 140 MG/ML SOAJ Inject 140 mg into the skin every 14 (fourteen) days.   ezetimibe (ZETIA) 10 MG tablet TAKE ONE TABLET EACH DAY   famotidine (PEPCID) 20 MG tablet Take 1 tablet (20 mg total) by mouth 2 (two) times daily.   ketoconazole (NIZORAL) 2 % cream Apply topically daily.   Melatonin 1 MG CHEW Chew 1 mg by mouth daily.   methocarbamol (ROBAXIN) 500 MG tablet Take 1 tablet (500 mg total) by mouth every 6 (six) hours as needed for muscle spasms.   nitroGLYCERIN (NITROSTAT) 0.4 MG SL tablet ONE TABLET UNDER TONGUE EVERY 5 MINUTES AS  NEEDED FOR CHEST PAIN   oxyCODONE-acetaminophen (PERCOCET/ROXICET) 5-325 MG tablet Take 1 tablet by mouth daily as needed for severe pain.    tadalafil (CIALIS) 5 MG tablet Take 1 tablet (5 mg total) by mouth daily as needed for erectile dysfunction.   valACYclovir (VALTREX) 500 MG tablet Take 500 mg by mouth daily as needed (cold sores). Take for 5 days at first sign of outbreak   verapamil (CALAN) 80 MG tablet TAKE ONE TABLET DAILY AS NEEDED   verapamil (CALAN-SR) 180 MG CR tablet TAKE ONE TABLET DAILY   [DISCONTINUED] methylPREDNISolone (MEDROL DOSEPAK) 4 MG TBPK tablet As directed (Patient not taking: Reported on 05/12/2021)   [DISCONTINUED] polyethylene glycol (MIRALAX / GLYCOLAX) 17 g packet Take 17 g by mouth daily as needed for moderate constipation. (Patient not taking: Reported on 05/12/2021)   [DISCONTINUED] predniSONE (DELTASONE) 10 MG tablet 4 tablets for 2 days, 3 tablets for 2 days, 2 tablets for 2 days, 1 tablet for 2 days   No facility-administered encounter medications on file as of 05/12/2021.    Allergies (verified) Ciprofloxacin, Iodinated diagnostic agents, Penicillins, Yellow jacket venom, Atorvastatin, Clindamycin/lincomycin, Erythromycin, Ibuprofen, Keflex [cephalexin], Other, Rosuvastatin, Sulfa antibiotics, and Latex   History: Past Medical History:  Diagnosis Date   Allergy    seasonal allergies    Arthritis    Asthma    Blood transfusion without reported diagnosis 10/26/1983  Calcium oxalate renal stones    early 1990's   Cataract    Chicken pox    Chronic neck pain    Depression    GERD (gastroesophageal reflux disease)    Hyperlipidemia 10/10/2013   Hypertrophic obstructive cardiomyopathy(425.11) 10/10/2013   IBS (irritable bowel syndrome) 12/07/2011   Migraines    Neuromuscular disorder (HCC)    radiculopathy left side    Obesity (BMI 30-39.9) 10/10/2013   Seasonal allergies    Past Surgical History:  Procedure Laterality Date   CATARACT  EXTRACTION Left 10/2020   CORONARY STENT INTERVENTION N/A 03/23/2019   Procedure: CORONARY STENT INTERVENTION;  Surgeon: Marykay Lex, MD;  Location: [facility] INVASIVE CV LAB;  Service: Cardiovascular;  Laterality: N/A;   LEFT HEART CATH AND CORONARY ANGIOGRAPHY N/A 03/23/2019   Procedure: LEFT HEART CATH AND CORONARY ANGIOGRAPHY;  Surgeon: Marykay Lex, MD;  Location: [facility] INVASIVE CV LAB;  Service: Cardiovascular;  Laterality: N/A;   left knee surgery  1990   MOUTH SURGERY  1975/1993   pre cancerous mole  2013   prostate procedure  1980   umbilical cyst  1985   Family History  Problem Relation Age of Onset   Arthritis Mother    Hyperlipidemia Mother    Heart disease Mother    Hypertension Mother    Heart disease Father    Hypertension Father    Diabetes Father    Arthritis Paternal Grandmother    Diabetes Paternal Grandmother    Colon cancer Paternal Grandfather    Heart disease Paternal Grandfather    Diabetes Paternal Grandfather    Hyperlipidemia Maternal Grandmother    Hypertension Other        both sets of parents   Esophageal cancer Neg Hx    Rectal cancer Neg Hx    Stomach cancer Neg Hx    Social History   Socioeconomic History   Marital status: Single    Spouse name: Not on file   Number of children: Not on file   Years of education: Not on file   Highest education level: Not on file  Occupational History   Not on file  Tobacco Use   Smoking status: Never   Smokeless tobacco: Never  Vaping Use   Vaping Use: Never used  Substance and Sexual Activity   Alcohol use: Yes    Alcohol/week: 1.0 standard drink    Types: 1 Glasses of wine per week    Comment: once a month   Drug use: No   Sexual activity: Not Currently  Other Topics Concern   Not on file  Social History Narrative   Not on file   Social Determinants of Health   Financial Resource Strain: Low Risk    Difficulty of Paying Living Expenses: Not hard at all  Food Insecurity: No Food Insecurity    Worried About Programme researcher, broadcasting/film/video in the Last Year: Never true   Ran Out of Food in the Last Year: Never true  Transportation Needs: No Transportation Needs   Lack of Transportation (Medical): No   Lack of Transportation (Non-Medical): No  Physical Activity: Inactive   Days of Exercise per Week: 0 days   Minutes of Exercise per Session: 0 min  Stress: Stress Concern Present   Feeling of Stress : To some extent  Social Connections: Socially Isolated   Frequency of Communication with Friends and Family: More than three times a week   Frequency of Social Gatherings with Friends and Family: More than  three times a week   Attends Religious Services: Never   Active Member of Clubs or Organizations: No   Attends Banker Meetings: Never   Marital Status: Never married    Tobacco Counseling Counseling given: Not Answered   Clinical Intake:  Pre-visit preparation completed: Yes  Pain : No/denies pain     BMI - recorded: 29.27 Nutritional Status: BMI 25 -29 Overweight Nutritional Risks: None Diabetes: No  How often do you need to have someone help you when you read instructions, pamphlets, or other written materials from your doctor or pharmacy?: 1 - Never  Diabetic?No  Interpreter Needed?: No  Information entered by :: Lanier Ensign, LPN   Activities of Daily Living In your present state of health, do you have any difficulty performing the following activities: 05/12/2021  Hearing? N  Vision? N  Difficulty concentrating or making decisions? N  Walking or climbing stairs? N  Dressing or bathing? N  Doing errands, shopping? N  Preparing Food and eating ? N  Using the Toilet? N  In the past six months, have you accidently leaked urine? Y  Comment may be related to back and spine  Do you have problems with loss of bowel control? N  Managing your Medications? N  Managing your Finances? N  Housekeeping or managing your Housekeeping? N  Some recent data  might be hidden    Patient Care Team: [name], MD as PCP - General (Family Medicine) Baldo Daub, MD as PCP - Cardiology (Cardiology) Othella Boyer, MD as Consulting Physician (Cardiology) Aris Lot, MD as Consulting Physician (Dermatology)  Indicate any recent Medical Services you may have received from other than [facility] providers in the past year (date may be approximate).     Assessment:   This is a routine wellness examination for [place].  Hearing/Vision screen Hearing Screening - Comments:: Pt denies any hearing issues  Vision Screening - Comments:: Pt follows up with dr Elmer Picker for annual eye exams   Dietary issues and exercise activities discussed: Current Exercise Habits: The patient does not participate in regular exercise at present   Goals Addressed             This Visit's Progress    Patient Stated       Lose weight and start walking        Depression Screen PHQ 2/9 Scores 05/12/2021  PHQ - 2 Score 1    Fall Risk Fall Risk  05/12/2021  Falls in the past year? 0  Number falls in past yr: 0  Injury with Fall? 0  Risk for fall due to : Impaired vision  Follow up Falls prevention discussed    FALL RISK PREVENTION PERTAINING TO THE HOME:  Any stairs in or around the home? Yes  If so, are there any without handrails? Yes  Home free of loose throw rugs in walkways, pet beds, electrical cords, etc? Yes  Adequate lighting in your home to reduce risk of falls? Yes   ASSISTIVE DEVICES UTILIZED TO PREVENT FALLS:  Life alert? No  Use of a cane, walker or w/c? No  Grab bars in the bathroom? No  Shower chair or bench in shower? No  Elevated toilet seat or a handicapped toilet? No   TIMED UP AND GO:  Was the test performed? Yes .  Length of time to ambulate 10 feet: 10 sec.   Gait steady and fast without use of assistive device  Cognitive Function:  6CIT Screen 05/12/2021  What Year? 0 points  What month? 0 points  What  time? 0 points  Count back from 20 0 points  Months in reverse 0 points  Repeat phrase 0 points  Total Score 0    Immunizations Immunization History  Administered Date(s) Administered   Influenza,inj,Quad PF,6+ Mos 07/03/2014, 09/29/2015   Moderna Sars-Covid-2 Vaccination 02/01/2020, 02/29/2020   Tdap 12/27/2013    TDAP status: Up to date  Flu Vaccine status: Due, Education has been provided regarding the importance of this vaccine. Advised may receive this vaccine at local pharmacy or Health Dept. Aware to provide a copy of the vaccination record if obtained from local pharmacy or Health Dept. Verbalized acceptance and understanding.  Pneumococcal vaccine status: Due, Education has been provided regarding the importance of this vaccine. Advised may receive this vaccine at local pharmacy or Health Dept. Aware to provide a copy of the vaccination record if obtained from local pharmacy or Health Dept. Verbalized acceptance and understanding.  Covid-19 vaccine status: Completed vaccines  Qualifies for Shingles Vaccine? Yes   Zostavax completed No   Shingrix Completed?: No.    Education has been provided regarding the importance of this vaccine. Patient has been advised to call insurance company to determine out of pocket expense if they have not yet received this vaccine. Advised may also receive vaccine at local pharmacy or Health Dept. Verbalized acceptance and understanding.  Screening Tests Health Maintenance  Topic Date Due   Zoster Vaccines- Shingrix (1 of 2) Never done   PNA vac Low Risk Adult (1 of 2 - PCV13) Never done   COVID-19 Vaccine (3 - Moderna risk series) 03/28/2020   COLONOSCOPY (Pts 45-32yrs Insurance coverage will need to be confirmed)  11/12/2033 (Originally 01/23/2000)   INFLUENZA VACCINE  05/25/2021   TETANUS/TDAP  12/28/2023   Hepatitis C Screening  Completed   HPV VACCINES  Aged Out    Health Maintenance  Health Maintenance Due  Topic Date Due   Zoster  Vaccines- Shingrix (1 of 2) Never done   PNA vac Low Risk Adult (1 of 2 - PCV13) Never done   COVID-19 Vaccine (3 - Moderna risk series) 03/28/2020    Colorectal cancer : wants to speak to dr Clent Ridges   Additional Screening:  Hepatitis C Screening:  Completed 11/12/13  Vision Screening: Recommended annual ophthalmology exams for early detection of glaucoma and other disorders of the eye. Is the patient up to date with their annual eye exam?  Yes  Who is the provider or what is the name of the office in which the patient attends annual eye exams? Dr Elmer Picker  If pt is not established with a provider, would they like to be referred to a provider to establish care? No .   Dental Screening: Recommended annual dental exams for proper oral hygiene  Community Resource Referral / Chronic Care Management: CRR required this visit?  No   CCM required this visit?  No      Plan:     I have personally reviewed and noted the following in the patient's chart:   Medical and social history Use of alcohol, tobacco or illicit drugs  Current medications and supplements including opioid prescriptions. Patient is currently taking opioid prescriptions. Information provided to patient regarding non-opioid alternatives. Patient advised to discuss non-opioid treatment plan with their provider. Functional ability and status Nutritional status Physical activity Advanced directives List of other physicians Hospitalizations, surgeries, and ER visits in previous 12 months Vitals Screenings  to include cognitive, depression, and falls Referrals and appointments  In addition, I have reviewed and discussed with patient certain preventive protocols, quality metrics, and best practice recommendations. A written personalized care plan for preventive services as well as general preventive health recommendations were provided to patient.     Marzella Schlein, LPN   [id number]   Nurse Notes: None

## 2021-05-15 ENCOUNTER — Encounter: Payer: Medicare Other | Admitting: Family Medicine

## 2021-05-23 ENCOUNTER — Other Ambulatory Visit: Payer: Self-pay | Admitting: Cardiology

## 2021-05-26 ENCOUNTER — Encounter (HOSPITAL_BASED_OUTPATIENT_CLINIC_OR_DEPARTMENT_OTHER): Payer: Self-pay

## 2021-05-29 NOTE — Telephone Encounter (Signed)
Mr. Bearse- ok to keep your original appointment-  after 1 month (2 shots and 2 weeks) you will see maximal effect of Repatha - so if you stay on track, you should have 3 injections and be able to get the cholesterol tested before our visit in late September- no worries.  Dr. Rexene Edison

## 2021-06-03 ENCOUNTER — Other Ambulatory Visit: Payer: Self-pay

## 2021-06-04 ENCOUNTER — Encounter: Payer: Self-pay | Admitting: Family Medicine

## 2021-06-04 ENCOUNTER — Ambulatory Visit (INDEPENDENT_AMBULATORY_CARE_PROVIDER_SITE_OTHER): Payer: Medicare Other | Admitting: Family Medicine

## 2021-06-04 VITALS — BP 110/68 | HR 99 | Temp 98.6°F | Ht 68.25 in | Wt 191.0 lb

## 2021-06-04 DIAGNOSIS — R7303 Prediabetes: Secondary | ICD-10-CM

## 2021-06-04 DIAGNOSIS — M1990 Unspecified osteoarthritis, unspecified site: Secondary | ICD-10-CM | POA: Diagnosis not present

## 2021-06-04 DIAGNOSIS — J4520 Mild intermittent asthma, uncomplicated: Secondary | ICD-10-CM

## 2021-06-04 DIAGNOSIS — E538 Deficiency of other specified B group vitamins: Secondary | ICD-10-CM

## 2021-06-04 DIAGNOSIS — N401 Enlarged prostate with lower urinary tract symptoms: Secondary | ICD-10-CM | POA: Diagnosis not present

## 2021-06-04 DIAGNOSIS — M26652 Arthropathy of left temporomandibular joint: Secondary | ICD-10-CM

## 2021-06-04 DIAGNOSIS — Z23 Encounter for immunization: Secondary | ICD-10-CM

## 2021-06-04 DIAGNOSIS — N138 Other obstructive and reflux uropathy: Secondary | ICD-10-CM

## 2021-06-04 DIAGNOSIS — E039 Hypothyroidism, unspecified: Secondary | ICD-10-CM | POA: Diagnosis not present

## 2021-06-04 DIAGNOSIS — I422 Other hypertrophic cardiomyopathy: Secondary | ICD-10-CM

## 2021-06-04 DIAGNOSIS — Z0000 Encounter for general adult medical examination without abnormal findings: Secondary | ICD-10-CM

## 2021-06-04 DIAGNOSIS — K589 Irritable bowel syndrome without diarrhea: Secondary | ICD-10-CM

## 2021-06-04 DIAGNOSIS — E291 Testicular hypofunction: Secondary | ICD-10-CM

## 2021-06-04 DIAGNOSIS — F32A Depression, unspecified: Secondary | ICD-10-CM | POA: Diagnosis not present

## 2021-06-04 DIAGNOSIS — I25119 Atherosclerotic heart disease of native coronary artery with unspecified angina pectoris: Secondary | ICD-10-CM

## 2021-06-04 DIAGNOSIS — K219 Gastro-esophageal reflux disease without esophagitis: Secondary | ICD-10-CM

## 2021-06-04 DIAGNOSIS — F419 Anxiety disorder, unspecified: Secondary | ICD-10-CM

## 2021-06-04 LAB — HEPATIC FUNCTION PANEL
ALT: 14 U/L (ref 0–53)
AST: 15 U/L (ref 0–37)
Albumin: 4.5 g/dL (ref 3.5–5.2)
Alkaline Phosphatase: 80 U/L (ref 39–117)
Bilirubin, Direct: 0.2 mg/dL (ref 0.0–0.3)
Total Bilirubin: 1.2 mg/dL (ref 0.2–1.2)
Total Protein: 6.6 g/dL (ref 6.0–8.3)

## 2021-06-04 LAB — BASIC METABOLIC PANEL
BUN: 10 mg/dL (ref 6–23)
CO2: 27 mEq/L (ref 19–32)
Calcium: 9.7 mg/dL (ref 8.4–10.5)
Chloride: 106 mEq/L (ref 96–112)
Creatinine, Ser: 0.82 mg/dL (ref 0.40–1.50)
GFR: 91.63 mL/min (ref >60.00)
Glucose, Bld: 89 mg/dL (ref 70–99)
Potassium: 4.4 mEq/L (ref 3.5–5.1)
Sodium: 142 mEq/L (ref 135–145)

## 2021-06-04 LAB — CBC WITH DIFFERENTIAL/PLATELET
Basophils Absolute: 0 10*3/uL (ref 0.0–0.1)
Basophils Relative: 0.8 % (ref 0.0–3.0)
Eosinophils Absolute: 0 10*3/uL (ref 0.0–0.7)
Eosinophils Relative: 0.6 % (ref 0.0–5.0)
HCT: 45.6 % (ref 39.0–52.0)
Hemoglobin: 15.4 g/dL (ref 13.0–17.0)
Lymphocytes Relative: 23.8 % (ref 12.0–46.0)
Lymphs Abs: 1.5 10*3/uL (ref 0.7–4.0)
MCHC: 33.8 g/dL (ref 30.0–36.0)
MCV: 94.8 fl (ref 78.0–100.0)
Monocytes Absolute: 0.6 10*3/uL (ref 0.1–1.0)
Monocytes Relative: 9.7 % (ref 3.0–12.0)
Neutro Abs: 4 10*3/uL (ref 1.4–7.7)
Neutrophils Relative %: 65.1 % (ref 43.0–77.0)
Platelets: 194 10*3/uL (ref 150.0–400.0)
RBC: 4.81 Mil/uL (ref 4.22–5.81)
RDW: 15.1 % (ref 11.5–15.5)
WBC: 6.2 10*3/uL (ref 4.0–10.5)

## 2021-06-04 LAB — PSA: PSA: 0.31 ng/mL (ref 0.10–4.00)

## 2021-06-04 LAB — LIPID PANEL
Cholesterol: 125 mg/dL (ref 0–200)
HDL: 37.7 mg/dL — ABNORMAL LOW (ref >39.00)
LDL Cholesterol: 55 mg/dL (ref 0–99)
NonHDL: 87.78
Total CHOL/HDL Ratio: 3
Triglycerides: 163 mg/dL — ABNORMAL HIGH (ref 0.0–149.0)
VLDL: 32.6 mg/dL (ref 0.0–40.0)

## 2021-06-04 LAB — HEMOGLOBIN A1C: Hgb A1c MFr Bld: 5.7 % (ref 4.6–6.5)

## 2021-06-04 LAB — T4, FREE: Free T4: 0.71 ng/dL (ref 0.60–1.60)

## 2021-06-04 LAB — TSH: TSH: 4.68 u[IU]/mL (ref 0.35–5.50)

## 2021-06-04 LAB — VITAMIN B12: Vitamin B-12: 196 pg/mL — ABNORMAL LOW (ref 211–911)

## 2021-06-04 LAB — T3, FREE: T3, Free: 3.6 pg/mL (ref 2.3–4.2)

## 2021-06-04 MED ORDER — METHOCARBAMOL 500 MG PO TABS: 500.0000 mg | ORAL_TABLET | Freq: Four times a day (QID) | ORAL | 3 refills | Status: DC | PRN

## 2021-06-04 NOTE — Addendum Note (Signed)
Addended by: Carola Rhine on: 06/04/2021 09:07 AM   Modules accepted: Orders

## 2021-06-04 NOTE — Progress Notes (Signed)
Subjective:    Patient ID: Edward Curtis, male    DOB: 09-18-1956, 65 y.o.   MRN: [id number]  HPI Here to follow up on issues. He is doing well from a cardiac standpoint. His CAD and hypertrophic cardiomyopathy is stable. He never has chest pain. He takes immediate release Verapamil rarely for SVT episodes. He complains of pain in the left jaw and left ear for several months. He has a hx of TMJ, and hos chiropractor was able to relieve the pain a year ago. He has also had some leg cramps at night recently, and he has learned to control these by taking OTC magnesium.    Review of Systems  Constitutional: Negative.   HENT: Negative.    Eyes: Negative.   Respiratory: Negative.    Cardiovascular: Negative.   Gastrointestinal: Negative.   Genitourinary: Negative.   Musculoskeletal:  Positive for myalgias.  Skin: Negative.   Neurological: Negative.   Psychiatric/Behavioral: Negative.        Objective:   Physical Exam Constitutional:      General: He is not in acute distress.    Appearance: Normal appearance. He is well-developed. He is not diaphoretic.  HENT:     Head: Normocephalic and atraumatic.     Right Ear: External ear normal.     Left Ear: External ear normal.     Nose: Nose normal.     Mouth/Throat:     Pharynx: No oropharyngeal exudate.     Comments: He is tender over the left TMJ, ROM is full  Eyes:     General: No scleral icterus.       Right eye: No discharge.        Left eye: No discharge.     Conjunctiva/sclera: Conjunctivae normal.     Pupils: Pupils are equal, round, and reactive to light.  Neck:     Thyroid: No thyromegaly.     Vascular: No JVD.     Trachea: No tracheal deviation.  Cardiovascular:     Rate and Rhythm: Normal rate and regular rhythm.     Heart sounds: Normal heart sounds. No murmur heard.   No friction rub. No gallop.  Pulmonary:     Effort: Pulmonary effort is normal. No respiratory distress.     Breath sounds: Normal breath sounds. No  wheezing or rales.  Chest:     Chest wall: No tenderness.  Abdominal:     General: Bowel sounds are normal. There is no distension.     Palpations: Abdomen is soft. There is no mass.     Tenderness: There is no abdominal tenderness. There is no guarding or rebound.  Genitourinary:    Penis: Normal. No tenderness.      Testes: Normal.     Prostate: Normal.     Rectum: Normal. Guaiac result negative.  Musculoskeletal:        General: No tenderness. Normal range of motion.     Cervical back: Neck supple.  Lymphadenopathy:     Cervical: No cervical adenopathy.  Skin:    General: Skin is warm and dry.     Coloration: Skin is not pale.     Findings: No erythema or rash.  Neurological:     Mental Status: He is alert and oriented to person, place, and time.     Cranial Nerves: No cranial nerve deficit.     Motor: No abnormal muscle tone.     Coordination: Coordination normal.     Deep Tendon Reflexes: Reflexes  are normal and symmetric. Reflexes normal.  Psychiatric:        Behavior: Behavior normal.        Thought Content: Thought content normal.        Judgment: Judgment normal.          Assessment & Plan:  His CAD and cardiomyopathy and SVT are stable. His GERD and IBS and depression with anxiety are stable. He will see his chiropractor about the TMJ pain. We will get fasting labs to check lipids, etc. Set up his first colonoscopy. Given a Prevnar 13 vaccine.  We spent 35 minutes reviewing records and discussing these issues.  Gershon Crane, MD

## 2021-06-04 NOTE — Addendum Note (Signed)
Addended by: Bonnye Fava on: 06/04/2021 08:49 AM   Modules accepted: Orders

## 2021-06-08 ENCOUNTER — Encounter: Payer: Self-pay | Admitting: Family Medicine

## 2021-06-09 ENCOUNTER — Other Ambulatory Visit: Payer: Self-pay

## 2021-06-09 MED ORDER — TADALAFIL 5 MG PO TABS: 5.0000 mg | ORAL_TABLET | Freq: Every day | ORAL | 1 refills | Status: DC | PRN

## 2021-06-18 ENCOUNTER — Emergency Department (HOSPITAL_BASED_OUTPATIENT_CLINIC_OR_DEPARTMENT_OTHER): Payer: Medicare Other

## 2021-06-18 ENCOUNTER — Encounter (HOSPITAL_BASED_OUTPATIENT_CLINIC_OR_DEPARTMENT_OTHER): Payer: Self-pay | Admitting: Emergency Medicine

## 2021-06-18 ENCOUNTER — Observation Stay (HOSPITAL_BASED_OUTPATIENT_CLINIC_OR_DEPARTMENT_OTHER)
Admission: EM | Admit: 2021-06-18 | Discharge: 2021-06-20 | Disposition: A | Discharge status: Home / Self Care | Payer: Medicare Other | Attending: Emergency Medicine | Admitting: Emergency Medicine

## 2021-06-18 ENCOUNTER — Encounter (HOSPITAL_BASED_OUTPATIENT_CLINIC_OR_DEPARTMENT_OTHER): Payer: Self-pay

## 2021-06-18 ENCOUNTER — Emergency Department (HOSPITAL_BASED_OUTPATIENT_CLINIC_OR_DEPARTMENT_OTHER)
Admission: EM | Admit: 2021-06-18 | Discharge: 2021-06-18 | Disposition: A | Discharge status: Home / Self Care | Payer: Medicare Other | Source: Home / Self Care | Attending: Emergency Medicine | Admitting: Emergency Medicine

## 2021-06-18 ENCOUNTER — Other Ambulatory Visit: Payer: Self-pay

## 2021-06-18 DIAGNOSIS — I471 Supraventricular tachycardia, unspecified: Secondary | ICD-10-CM | POA: Diagnosis present

## 2021-06-18 DIAGNOSIS — K219 Gastro-esophageal reflux disease without esophagitis: Secondary | ICD-10-CM | POA: Diagnosis present

## 2021-06-18 DIAGNOSIS — I2510 Atherosclerotic heart disease of native coronary artery without angina pectoris: Secondary | ICD-10-CM | POA: Diagnosis not present

## 2021-06-18 DIAGNOSIS — I25119 Atherosclerotic heart disease of native coronary artery with unspecified angina pectoris: Secondary | ICD-10-CM | POA: Diagnosis present

## 2021-06-18 DIAGNOSIS — Z7982 Long term (current) use of aspirin: Secondary | ICD-10-CM | POA: Insufficient documentation

## 2021-06-18 DIAGNOSIS — K819 Cholecystitis, unspecified: Secondary | ICD-10-CM | POA: Diagnosis present

## 2021-06-18 DIAGNOSIS — R1011 Right upper quadrant pain: Secondary | ICD-10-CM

## 2021-06-18 DIAGNOSIS — J45909 Unspecified asthma, uncomplicated: Secondary | ICD-10-CM | POA: Insufficient documentation

## 2021-06-18 DIAGNOSIS — K8020 Calculus of gallbladder without cholecystitis without obstruction: Secondary | ICD-10-CM | POA: Diagnosis present

## 2021-06-18 DIAGNOSIS — I422 Other hypertrophic cardiomyopathy: Secondary | ICD-10-CM

## 2021-06-18 DIAGNOSIS — Z79899 Other long term (current) drug therapy: Secondary | ICD-10-CM | POA: Insufficient documentation

## 2021-06-18 DIAGNOSIS — R079 Chest pain, unspecified: Secondary | ICD-10-CM | POA: Insufficient documentation

## 2021-06-18 DIAGNOSIS — Z91040 Latex allergy status: Secondary | ICD-10-CM | POA: Insufficient documentation

## 2021-06-18 DIAGNOSIS — R7303 Prediabetes: Secondary | ICD-10-CM | POA: Diagnosis not present

## 2021-06-18 DIAGNOSIS — Z20822 Contact with and (suspected) exposure to covid-19: Secondary | ICD-10-CM | POA: Insufficient documentation

## 2021-06-18 DIAGNOSIS — K8050 Calculus of bile duct without cholangitis or cholecystitis without obstruction: Secondary | ICD-10-CM

## 2021-06-18 DIAGNOSIS — K8012 Calculus of gallbladder with acute and chronic cholecystitis without obstruction: Principal | ICD-10-CM | POA: Insufficient documentation

## 2021-06-18 DIAGNOSIS — K8000 Calculus of gallbladder with acute cholecystitis without obstruction: Secondary | ICD-10-CM | POA: Diagnosis present

## 2021-06-18 LAB — DIFFERENTIAL
Abs Immature Granulocytes: 0.02 10*3/uL (ref 0.00–0.07)
Basophils Absolute: 0 10*3/uL (ref 0.0–0.1)
Basophils Relative: 1 %
Eosinophils Absolute: 0.1 10*3/uL (ref 0.0–0.5)
Eosinophils Relative: 2 %
Immature Granulocytes: 0 %
Lymphocytes Relative: 32 %
Lymphs Abs: 1.7 10*3/uL (ref 0.7–4.0)
Monocytes Absolute: 0.5 10*3/uL (ref 0.1–1.0)
Monocytes Relative: 10 %
Neutro Abs: 3 10*3/uL (ref 1.7–7.7)
Neutrophils Relative %: 55 %

## 2021-06-18 LAB — COMPREHENSIVE METABOLIC PANEL
ALT: 12 U/L (ref 0–44)
AST: 14 U/L — ABNORMAL LOW (ref 15–41)
Albumin: 4.3 g/dL (ref 3.5–5.0)
Alkaline Phosphatase: 69 U/L (ref 38–126)
Anion gap: 12 (ref 5–15)
BUN: 11 mg/dL (ref 8–23)
CO2: 23 mmol/L (ref 22–32)
Calcium: 9.6 mg/dL (ref 8.9–10.3)
Chloride: 105 mmol/L (ref 98–111)
Creatinine, Ser: 0.79 mg/dL (ref 0.61–1.24)
GFR, Estimated: >60 mL/min (ref >60)
Glucose, Bld: 148 mg/dL — ABNORMAL HIGH (ref 70–99)
Potassium: 4 mmol/L (ref 3.5–5.1)
Sodium: 140 mmol/L (ref 135–145)
Total Bilirubin: 0.8 mg/dL (ref 0.3–1.2)
Total Protein: 7 g/dL (ref 6.5–8.1)

## 2021-06-18 LAB — CREATININE, SERUM
Creatinine, Ser: 0.77 mg/dL (ref 0.61–1.24)
GFR, Estimated: >60 mL/min (ref >60)

## 2021-06-18 LAB — LIPASE, BLOOD
Lipase: 54 U/L — ABNORMAL HIGH (ref 11–51)
Lipase: 26 U/L (ref 11–51)

## 2021-06-18 LAB — HEPATIC FUNCTION PANEL
ALT: 11 U/L (ref 0–44)
AST: 12 U/L — ABNORMAL LOW (ref 15–41)
Albumin: 3.9 g/dL (ref 3.5–5.0)
Alkaline Phosphatase: 63 U/L (ref 38–126)
Bilirubin, Direct: 0.2 mg/dL (ref 0.0–0.2)
Indirect Bilirubin: 0.7 mg/dL (ref 0.3–0.9)
Total Bilirubin: 0.9 mg/dL (ref 0.3–1.2)
Total Protein: 6.3 g/dL — ABNORMAL LOW (ref 6.5–8.1)

## 2021-06-18 LAB — BASIC METABOLIC PANEL
Anion gap: 9 (ref 5–15)
BUN: 10 mg/dL (ref 8–23)
CO2: 22 mmol/L (ref 22–32)
Calcium: 8.6 mg/dL — ABNORMAL LOW (ref 8.9–10.3)
Chloride: 111 mmol/L (ref 98–111)
Creatinine, Ser: 0.63 mg/dL (ref 0.61–1.24)
GFR, Estimated: >60 mL/min (ref >60)
Glucose, Bld: 100 mg/dL — ABNORMAL HIGH (ref 70–99)
Potassium: 3.5 mmol/L (ref 3.5–5.1)
Sodium: 142 mmol/L (ref 135–145)

## 2021-06-18 LAB — CBC WITH DIFFERENTIAL/PLATELET
Abs Immature Granulocytes: 0.07 10*3/uL (ref 0.00–0.07)
Basophils Absolute: 0 10*3/uL (ref 0.0–0.1)
Basophils Relative: 0 %
Eosinophils Absolute: 0 10*3/uL (ref 0.0–0.5)
Eosinophils Relative: 0 %
HCT: 43 % (ref 39.0–52.0)
Hemoglobin: 14.8 g/dL (ref 13.0–17.0)
Immature Granulocytes: 1 %
Lymphocytes Relative: 3 %
Lymphs Abs: 0.4 10*3/uL — ABNORMAL LOW (ref 0.7–4.0)
MCH: 32 pg (ref 26.0–34.0)
MCHC: 34.4 g/dL (ref 30.0–36.0)
MCV: 92.9 fL (ref 80.0–100.0)
Monocytes Absolute: 0.8 10*3/uL (ref 0.1–1.0)
Monocytes Relative: 6 %
Neutro Abs: 13.3 10*3/uL — ABNORMAL HIGH (ref 1.7–7.7)
Neutrophils Relative %: 90 %
Platelets: 196 10*3/uL (ref 150–400)
RBC: 4.63 MIL/uL (ref 4.22–5.81)
RDW: 14.1 % (ref 11.5–15.5)
WBC: 14.6 10*3/uL — ABNORMAL HIGH (ref 4.0–10.5)
nRBC: 0 % (ref 0.0–0.2)

## 2021-06-18 LAB — URINALYSIS, ROUTINE W REFLEX MICROSCOPIC
Bilirubin Urine: NEGATIVE
Glucose, UA: NEGATIVE mg/dL
Ketones, ur: 15 mg/dL — AB
Leukocytes,Ua: NEGATIVE
Nitrite: NEGATIVE
Protein, ur: NEGATIVE mg/dL
Specific Gravity, Urine: 1.015 (ref 1.005–1.030)
pH: 5.5 (ref 5.0–8.0)

## 2021-06-18 LAB — CBC
HCT: 30.5 % — ABNORMAL LOW (ref 39.0–52.0)
HCT: 41.8 % (ref 39.0–52.0)
Hemoglobin: 10.3 g/dL — ABNORMAL LOW (ref 13.0–17.0)
Hemoglobin: 14.3 g/dL (ref 13.0–17.0)
MCH: 32.1 pg (ref 26.0–34.0)
MCH: 32.5 pg (ref 26.0–34.0)
MCHC: 33.8 g/dL (ref 30.0–36.0)
MCHC: 34.2 g/dL (ref 30.0–36.0)
MCV: 95 fL (ref 80.0–100.0)
MCV: 95 fL (ref 80.0–100.0)
Platelets: 123 10*3/uL — ABNORMAL LOW (ref 150–400)
Platelets: 192 10*3/uL (ref 150–400)
RBC: 3.21 MIL/uL — ABNORMAL LOW (ref 4.22–5.81)
RBC: 4.4 MIL/uL (ref 4.22–5.81)
RDW: 14.3 % (ref 11.5–15.5)
RDW: 14.4 % (ref 11.5–15.5)
WBC: 5.5 10*3/uL (ref 4.0–10.5)
WBC: 13.4 10*3/uL — ABNORMAL HIGH (ref 4.0–10.5)
nRBC: 0 % (ref 0.0–0.2)
nRBC: 0 % (ref 0.0–0.2)

## 2021-06-18 LAB — RESP PANEL BY RT-PCR (FLU A&B, COVID) ARPGX2
Influenza A by PCR: NEGATIVE
Influenza B by PCR: NEGATIVE
SARS Coronavirus 2 by RT PCR: NEGATIVE

## 2021-06-18 LAB — TROPONIN I (HIGH SENSITIVITY)
Troponin I (High Sensitivity): 3 ng/L (ref <18)
Troponin I (High Sensitivity): 3 ng/L (ref <18)
Troponin I (High Sensitivity): 2 ng/L (ref <18)
Troponin I (High Sensitivity): 3 ng/L (ref <18)

## 2021-06-18 MED ORDER — FAMOTIDINE 20 MG PO TABS
20.0000 mg | ORAL_TABLET | Freq: Two times a day (BID) | ORAL | Status: DC
  Administered 2021-06-18 – 2021-06-20 (×3): 20 mg via ORAL
  Filled 2021-06-18 (×3): qty 1

## 2021-06-18 MED ORDER — HYDROMORPHONE HCL 1 MG/ML IJ SOLN
0.5000 mg | INTRAMUSCULAR | Status: DC | PRN
  Administered 2021-06-18 – 2021-06-19 (×6): 1 mg via INTRAVENOUS
  Filled 2021-06-18 (×6): qty 1

## 2021-06-18 MED ORDER — HYDROMORPHONE HCL 1 MG/ML IJ SOLN
1.0000 mg | Freq: Once | INTRAMUSCULAR | Status: DC
Start: 2021-06-18 — End: 2021-06-18
  Filled 2021-06-18: qty 1

## 2021-06-18 MED ORDER — DOCUSATE SODIUM 100 MG PO CAPS
100.0000 mg | ORAL_CAPSULE | Freq: Two times a day (BID) | ORAL | Status: DC
  Administered 2021-06-18 – 2021-06-20 (×3): 100 mg via ORAL
  Filled 2021-06-18 (×3): qty 1

## 2021-06-18 MED ORDER — VERAPAMIL HCL ER 180 MG PO TBCR
180.0000 mg | EXTENDED_RELEASE_TABLET | Freq: Every day | ORAL | Status: DC
  Administered 2021-06-19 – 2021-06-20 (×2): 180 mg via ORAL
  Filled 2021-06-18 (×2): qty 1

## 2021-06-18 MED ORDER — FENTANYL CITRATE (PF) 100 MCG/2ML IJ SOLN
50.0000 ug | Freq: Once | INTRAMUSCULAR | Status: AC
  Administered 2021-06-18: 50 ug via INTRAVENOUS
  Filled 2021-06-18: qty 2

## 2021-06-18 MED ORDER — FENTANYL CITRATE (PF) 100 MCG/2ML IJ SOLN
INTRAMUSCULAR | Status: AC
  Administered 2021-06-18: 50 ug
  Filled 2021-06-18: qty 2

## 2021-06-18 MED ORDER — POLYETHYLENE GLYCOL 3350 17 G PO PACK: 17.0000 g | PACK | Freq: Every day | ORAL | Status: DC | PRN

## 2021-06-18 MED ORDER — ONDANSETRON HCL 4 MG/2ML IJ SOLN
4.0000 mg | Freq: Four times a day (QID) | INTRAMUSCULAR | Status: DC | PRN
  Administered 2021-06-18: 4 mg via INTRAVENOUS
  Filled 2021-06-18: qty 2

## 2021-06-18 MED ORDER — ONDANSETRON 4 MG PO TBDP: 4.0000 mg | ORAL_TABLET | Freq: Four times a day (QID) | ORAL | Status: DC | PRN

## 2021-06-18 MED ORDER — FENTANYL CITRATE (PF) 100 MCG/2ML IJ SOLN
50.0000 ug | Freq: Once | INTRAMUSCULAR | Status: AC
  Administered 2021-06-18: 50 ug via INTRAVENOUS

## 2021-06-18 MED ORDER — ONDANSETRON HCL 4 MG/2ML IJ SOLN
4.0000 mg | Freq: Once | INTRAMUSCULAR | Status: AC
  Administered 2021-06-18: 4 mg via INTRAVENOUS
  Filled 2021-06-18: qty 2

## 2021-06-18 MED ORDER — ACETAMINOPHEN 500 MG PO TABS
1000.0000 mg | ORAL_TABLET | Freq: Four times a day (QID) | ORAL | Status: DC
  Administered 2021-06-19 – 2021-06-20 (×3): 1000 mg via ORAL
  Filled 2021-06-18 (×4): qty 2

## 2021-06-18 MED ORDER — FENTANYL CITRATE (PF) 100 MCG/2ML IJ SOLN
100.0000 ug | Freq: Once | INTRAMUSCULAR | Status: AC
  Administered 2021-06-18: 100 ug via INTRAVENOUS
  Filled 2021-06-18: qty 2

## 2021-06-18 MED ORDER — ONDANSETRON HCL 4 MG/2ML IJ SOLN: 4.0000 mg | Freq: Once | INTRAMUSCULAR | Status: DC

## 2021-06-18 MED ORDER — DIPHENHYDRAMINE HCL 50 MG/ML IJ SOLN: 12.5000 mg | Freq: Four times a day (QID) | INTRAMUSCULAR | Status: DC | PRN

## 2021-06-18 MED ORDER — ONDANSETRON 4 MG PO TBDP: 8.0000 mg | ORAL_TABLET | Freq: Once | ORAL | Status: DC

## 2021-06-18 MED ORDER — SODIUM CHLORIDE 0.9 % IV BOLUS
1000.0000 mL | Freq: Once | INTRAVENOUS | Status: AC
  Administered 2021-06-18: 1000 mL via INTRAVENOUS

## 2021-06-18 MED ORDER — METHOCARBAMOL 500 MG PO TABS
500.0000 mg | ORAL_TABLET | Freq: Four times a day (QID) | ORAL | Status: DC
  Administered 2021-06-18 – 2021-06-20 (×6): 500 mg via ORAL
  Filled 2021-06-18 (×6): qty 1

## 2021-06-18 MED ORDER — MELATONIN 1 MG PO CHEW: 1.0000 mg | CHEWABLE_TABLET | Freq: Every day | ORAL | Status: DC

## 2021-06-18 MED ORDER — FENTANYL CITRATE (PF) 100 MCG/2ML IJ SOLN
50.0000 ug | Freq: Once | INTRAMUSCULAR | Status: AC
Start: 2021-06-18 — End: 2021-06-18
  Administered 2021-06-18: 50 ug via INTRAVENOUS
  Filled 2021-06-18: qty 2

## 2021-06-18 MED ORDER — HYDROMORPHONE HCL 1 MG/ML IJ SOLN
1.0000 mg | Freq: Once | INTRAMUSCULAR | Status: AC
Start: 2021-06-18 — End: 2021-06-18
  Administered 2021-06-18: 1 mg via INTRAVENOUS
  Filled 2021-06-18: qty 1

## 2021-06-18 MED ORDER — MELATONIN 3 MG PO TABS
1.5000 mg | ORAL_TABLET | Freq: Every day | ORAL | Status: DC
  Administered 2021-06-18 – 2021-06-19 (×2): 1.5 mg via ORAL
  Filled 2021-06-18 (×2): qty 1

## 2021-06-18 MED ORDER — OXYCODONE HCL 5 MG PO TABS: 5.0000 mg | ORAL_TABLET | ORAL | Status: DC | PRN

## 2021-06-18 MED ORDER — ENOXAPARIN SODIUM 40 MG/0.4ML IJ SOSY
40.0000 mg | PREFILLED_SYRINGE | INTRAMUSCULAR | Status: DC
  Administered 2021-06-18 – 2021-06-19 (×3): 40 mg via SUBCUTANEOUS
  Filled 2021-06-18 (×2): qty 0.4

## 2021-06-18 MED ORDER — OXYCODONE-ACETAMINOPHEN 10-325 MG PO TABS: 1.0000 | ORAL_TABLET | Freq: Four times a day (QID) | ORAL | 0 refills | Status: DC | PRN

## 2021-06-18 MED ORDER — DIPHENHYDRAMINE HCL 12.5 MG/5ML PO ELIX: 12.5000 mg | ORAL_SOLUTION | Freq: Four times a day (QID) | ORAL | Status: DC | PRN

## 2021-06-18 MED ORDER — SIMETHICONE 80 MG PO CHEW
40.0000 mg | CHEWABLE_TABLET | Freq: Four times a day (QID) | ORAL | Status: DC | PRN
  Filled 2021-06-18: qty 1

## 2021-06-18 MED ORDER — HYDROMORPHONE HCL 1 MG/ML IJ SOLN
1.0000 mg | Freq: Once | INTRAMUSCULAR | Status: AC
  Administered 2021-06-18: 1 mg via INTRAVENOUS
  Filled 2021-06-18: qty 1

## 2021-06-18 MED ORDER — ONDANSETRON 8 MG PO TBDP: 8.0000 mg | ORAL_TABLET | Freq: Three times a day (TID) | ORAL | 0 refills | Status: DC | PRN

## 2021-06-18 MED ORDER — SODIUM CHLORIDE 0.9 % IV SOLN: INTRAVENOUS | Status: DC

## 2021-06-18 MED ORDER — HYDROMORPHONE HCL 1 MG/ML IJ SOLN
0.5000 mg | Freq: Once | INTRAMUSCULAR | Status: DC
Start: 2021-06-18 — End: 2021-06-18

## 2021-06-18 NOTE — ED Provider Notes (Signed)
[facility] Provider Note: Lowella Dell, MD, FACEP  CSN: [id number] MRN: [id number] ARRIVAL: 06/18/21 at 0201 ROOM: DB010/DB010   CHIEF COMPLAINT  Chest Pain   HISTORY OF PRESENT ILLNESS  06/18/21 2:25 AM Edward Curtis is a 65 y.o. male who has been having intermittent chest pain for the past 2 weeks.  By chest pain he means the pain up under his right lower rib cage.  It radiates around to his right flank.  It is worse after eating.  He ate a burrito yesterday evening.  His latest episode began about 12:30 AM today.  He describes it as sharp and rates it as a 10 out of 10.  It is worse with movement or deep breathing.  He is not short of breath but it hurts to take a deep breath.  He is having nausea with this but no vomiting.  He has had no diarrhea.   He has a strong family history of gallbladder disease.   Past Medical History:  Diagnosis Date   Allergy    seasonal allergies    Arthritis    Asthma    Blood transfusion without reported diagnosis 10/26/1983   Calcium oxalate renal stones    early 1990's   Cataract    Chicken pox    Chronic neck pain    Depression    GERD (gastroesophageal reflux disease)    Hyperlipidemia 10/10/2013   Hypertrophic obstructive cardiomyopathy(425.11) 10/10/2013   IBS (irritable bowel syndrome) 12/07/2011   Migraines    Neuromuscular disorder (HCC)    radiculopathy left side    Obesity (BMI 30-39.9) 10/10/2013   Seasonal allergies     Past Surgical History:  Procedure Laterality Date   CATARACT EXTRACTION Left 10/2020   CORONARY STENT INTERVENTION N/A 03/23/2019   Procedure: CORONARY STENT INTERVENTION;  Surgeon: Marykay Lex, MD;  Location: [facility] INVASIVE CV LAB;  Service: Cardiovascular;  Laterality: N/A;   LEFT HEART CATH AND CORONARY ANGIOGRAPHY N/A 03/23/2019   Procedure: LEFT HEART CATH AND CORONARY ANGIOGRAPHY;  Surgeon: Marykay Lex, MD;  Location: [facility] INVASIVE CV LAB;  Service: Cardiovascular;  Laterality: N/A;    left knee surgery  1990   MOUTH SURGERY  1975/1993   pre cancerous mole  2013   prostate procedure  1980   umbilical cyst  1985    Family History  Problem Relation Age of Onset   Arthritis Mother    Hyperlipidemia Mother    Heart disease Mother    Hypertension Mother    Heart disease Father    Hypertension Father    Diabetes Father    Arthritis Paternal Grandmother    Diabetes Paternal Grandmother    Colon cancer Paternal Grandfather    Heart disease Paternal Grandfather    Diabetes Paternal Grandfather    Hyperlipidemia Maternal Grandmother    Hypertension Other        both sets of parents   Esophageal cancer Neg Hx    Rectal cancer Neg Hx    Stomach cancer Neg Hx     Social History   Tobacco Use   Smoking status: Never   Smokeless tobacco: Never  Vaping Use   Vaping Use: Never used  Substance Use Topics   Alcohol use: Yes    Alcohol/week: 1.0 standard drink    Types: 1 Glasses of wine per week    Comment: once a month   Drug use: No    Prior to Admission medications   Medication Sig Start Date  End Date Taking? Authorizing Provider  acetaminophen (TYLENOL) 500 MG tablet Take 1,000 mg by mouth daily as needed for moderate pain.     [provider]  aspirin EC 81 MG tablet Take 81 mg by mouth daily.    [provider]  Cyanocobalamin (VITAMIN B 12) 500 MCG TABS Take 500 mcg by mouth daily. 03/25/20   [provider]  diphenhydrAMINE (BENADRYL) 25 MG tablet Take 25 mg by mouth daily as needed (allergic reactions).    [provider]  EPINEPHrine (EPIPEN 2-PAK) 0.3 mg/0.3 mL IJ SOAJ injection Inject 0.3 mLs (0.3 mg total) into the muscle once as needed (for severe allergic reaction). CAll 911 immediately if you have to use this medicine 05/29/20   Nelwyn SalisburyFry, Stephen A, MD  Evolocumab [facility](REPATHA SURECLICK) 140 MG/ML SOAJ INJECT 140MG  INTO THE SKIN EVERY 14 DAYS 05/25/21   Baldo DaubMunley, Brian J, MD  ezetimibe (ZETIA) 10 MG tablet TAKE ONE TABLET EACH DAY  12/17/20   Baldo DaubMunley, Brian J, MD  famotidine (PEPCID) 20 MG tablet Take 1 tablet (20 mg total) by mouth 2 (two) times daily. 04/25/21   Terrilee FilesButler, Michael C, MD  ketoconazole (NIZORAL) 2 % cream Apply topically daily. 02/18/21   [provider]  Melatonin 1 MG CHEW Chew 1 mg by mouth daily. 10/26/19   [provider]  methocarbamol (ROBAXIN) 500 MG tablet Take 1 tablet (500 mg total) by mouth every 6 (six) hours as needed for muscle spasms. 06/04/21   Nelwyn SalisburyFry, Stephen A, MD  nitroGLYCERIN (NITROSTAT) 0.4 MG SL tablet ONE TABLET UNDER TONGUE EVERY 5 MINUTES AS NEEDED FOR CHEST PAIN 05/25/21   Baldo DaubMunley, Brian J, MD  oxyCODONE-acetaminophen (PERCOCET/ROXICET) 5-325 MG tablet Take 1 tablet by mouth daily as needed for severe pain.     [provider]  tadalafil (CIALIS) 5 MG tablet Take 1 tablet (5 mg total) by mouth daily as needed for erectile dysfunction. 06/09/21   Nelwyn SalisburyFry, Stephen A, MD  valACYclovir (VALTREX) 500 MG tablet Take 500 mg by mouth daily as needed (cold sores). Take for 5 days at first sign of outbreak    [provider]  verapamil (CALAN) 80 MG tablet TAKE ONE TABLET DAILY AS NEEDED 04/20/21   Baldo DaubMunley, Brian J, MD  verapamil (CALAN-SR) 180 MG CR tablet TAKE ONE TABLET DAILY 11/27/20   Baldo DaubMunley, Brian J, MD    Allergies Ciprofloxacin, Iodinated diagnostic agents, Penicillins, Yellow jacket venom, Atorvastatin, Clindamycin/lincomycin, Erythromycin, Ibuprofen, Keflex [cephalexin], Other, Rosuvastatin, Sulfa antibiotics, and Latex   REVIEW OF SYSTEMS  Negative except as noted here or in the History of Present Illness.   PHYSICAL EXAMINATION  Initial Vital Signs Blood pressure 134/85, pulse 90, temperature 98.3 F (36.8 C), temperature source Oral, resp. rate (!) 22, height 5\' 8"  (1.727 m), weight 86.2 kg, SpO2 100 %.  Examination General: Well-developed, well-nourished male in no acute distress; appearance consistent with age of record HENT: normocephalic;  atraumatic Eyes: pupils equal, round and reactive to light; extraocular muscles intact Neck: supple Heart: regular rate and rhythm; no murmurs, rubs or gallops Lungs: clear to auscultation bilaterally Abdomen: soft; nondistended; nontender; no masses or hepatosplenomegaly; bowel sounds present; multiple gallstones in gallbladder seen on bedside ultrasound:    Extremities: No deformity; full range of motion; pulses normal Neurologic: Awake, alert and oriented; motor function intact in all extremities and symmetric; no facial droop Skin: Warm and dry Psychiatric: Normal mood and affect   RESULTS  Summary of this visit's results, reviewed and interpreted by  myself:   EKG Interpretation  Date/Time:  Thursday June 18 2021 02:10:19 EDT Ventricular Rate:  82 PR Interval:  124 QRS Duration: 128 QT Interval:  381 QTC Calculation: 445 R Axis:   64 Text Interpretation: Sinus rhythm Right bundle branch block ST elevation, consider inferior injury Confirmed by Paula Libra ([id number]) on 06/18/2021 2:25:48 AM       Laboratory Studies: Results for orders placed or performed during the hospital encounter of 06/18/21 (from the past 24 hour(s))  Basic metabolic panel     Status: Abnormal   Collection Time: 06/18/21  2:16 AM  Result Value Ref Range   Sodium 142 135 - 145 mmol/L   Potassium 3.5 3.5 - 5.1 mmol/L   Chloride 111 98 - 111 mmol/L   CO2 22 22 - 32 mmol/L   Glucose, Bld 100 (H) 70 - 99 mg/dL   BUN 10 8 - 23 mg/dL   Creatinine, Ser 1.69 0.61 - 1.24 mg/dL   Calcium 8.6 (L) 8.9 - 10.3 mg/dL   GFR, Estimated >67 >89 mL/min   Anion gap 9 5 - 15  CBC     Status: Abnormal   Collection Time: 06/18/21  2:16 AM  Result Value Ref Range   WBC 5.5 4.0 - 10.5 K/uL   RBC 3.21 (L) 4.22 - 5.81 MIL/uL   Hemoglobin 10.3 (L) 13.0 - 17.0 g/dL   HCT 38.1 (L) 01.7 - 51.0 %   MCV 95.0 80.0 - 100.0 fL   MCH 32.1 26.0 - 34.0 pg   MCHC 33.8 30.0 - 36.0 g/dL   RDW 25.8 52.7 - 78.2 %   Platelets 123  (L) 150 - 400 K/uL   nRBC 0.0 0.0 - 0.2 %  Troponin I (High Sensitivity)     Status: None   Collection Time: 06/18/21  2:16 AM  Result Value Ref Range   Troponin I (High Sensitivity) 3 <18 ng/L  Differential     Status: None   Collection Time: 06/18/21  2:16 AM  Result Value Ref Range   Neutrophils Relative % 55 %   Neutro Abs 3.0 1.7 - 7.7 K/uL   Lymphocytes Relative 32 %   Lymphs Abs 1.7 0.7 - 4.0 K/uL   Monocytes Relative 10 %   Monocytes Absolute 0.5 0.1 - 1.0 K/uL   Eosinophils Relative 2 %   Eosinophils Absolute 0.1 0.0 - 0.5 K/uL   Basophils Relative 1 %   Basophils Absolute 0.0 0.0 - 0.1 K/uL   Immature Granulocytes 0 %   Abs Immature Granulocytes 0.02 0.00 - 0.07 K/uL  Hepatic function panel     Status: Abnormal   Collection Time: 06/18/21  2:16 AM  Result Value Ref Range   Total Protein 6.3 (L) 6.5 - 8.1 g/dL   Albumin 3.9 3.5 - 5.0 g/dL   AST 12 (L) 15 - 41 U/L   ALT 11 0 - 44 U/L   Alkaline Phosphatase 63 38 - 126 U/L   Total Bilirubin 0.9 0.3 - 1.2 mg/dL   Bilirubin, Direct 0.2 0.0 - 0.2 mg/dL   Indirect Bilirubin 0.7 0.3 - 0.9 mg/dL  Lipase, blood     Status: Abnormal   Collection Time: 06/18/21  2:16 AM  Result Value Ref Range   Lipase 54 (H) 11 - 51 U/L  Troponin I (High Sensitivity)     Status: None   Collection Time: 06/18/21  4:16 AM  Result Value Ref Range   Troponin I (High Sensitivity) 3 <18 ng/L  Imaging Studies: DG Chest Portable 1 View  Result Date: 06/18/2021 CLINICAL DATA:  Chest pain on the right EXAM: PORTABLE CHEST 1 VIEW COMPARISON:  04/24/2021 FINDINGS: The heart size and mediastinal contours are within normal limits. Both lungs are clear. The visualized skeletal structures are unremarkable. IMPRESSION: No acute abnormality noted. Electronically Signed   By: Alcide Clever M.D.   On: 06/18/2021 02:38   US Abdomen Limited RUQ (LIVER/GB)  Result Date: 06/18/2021 CLINICAL DATA:  65 year old male with history of right upper quadrant abdominal  pain and chest pain. Nausea for 1 week. EXAM: ULTRASOUND ABDOMEN LIMITED RIGHT UPPER QUADRANT COMPARISON:  Abdominal ultrasound 05/20/2020. FINDINGS: Gallbladder: There is some amorphous echogenic nonshadowing material lying dependently in the gallbladder, compatible with biliary sludge. In addition, multiple more echogenic foci are noted some of which demonstrate trace amounts of posterior acoustic shadowing, likely to represent tiny calculi. Gallbladder is moderately distended. Gallbladder wall thickness is normal (1.7 mm). No pericholecystic fluid. Per report from the sonographer, the patient did not exhibit a sonographic Murphy's sign on examination. Common bile duct: Diameter: 5.3 mm in the porta hepatis. Liver: No focal lesion identified. Within normal limits in parenchymal echogenicity. Portal vein is patent on color Doppler imaging with normal direction of blood flow towards the liver. Other: None. IMPRESSION: 1. Study is positive for biliary sludge and tiny gallstones in the gallbladder. However, there are no imaging findings to indicate an acute cholecystitis at this time. Electronically Signed   By: Trudie Reed M.D.   On: 06/18/2021 05:25    ED COURSE and MDM  Nursing notes, initial and subsequent vitals signs, including pulse oximetry, reviewed and interpreted by myself.  Vitals:   06/18/21 0345 06/18/21 0445 06/18/21 0530 06/18/21 0600  BP: 140/81 128/72 129/75 124/69  Pulse: 70 79 76 68  Resp: 15 20 (!) 26 15  Temp:      TempSrc:      SpO2: 100% 100% 100% 99%  Weight:      Height:       Medications  ondansetron (ZOFRAN) injection 4 mg (has no administration in time range)  ondansetron (ZOFRAN) injection 4 mg (4 mg Intravenous Given 06/18/21 0233)  HYDROmorphone (DILAUDID) injection 1 mg (1 mg Intravenous Given 06/18/21 0233)  HYDROmorphone (DILAUDID) injection 1 mg (1 mg Intravenous Given 06/18/21 0257)  fentaNYL (SUBLIMAZE) injection 100 mcg (100 mcg Intravenous Given 06/18/21  0327)  fentaNYL (SUBLIMAZE) injection 100 mcg (100 mcg Intravenous Given 06/18/21 0533)   3:58 AM Patient got no pain relief with 2 mg of Dilaudid IV.  He was switched to fentanyl and this is improved his pain.  Ultrasound of the biliary tract is pending.   5:37 AM Pain significantly improved and patient has much less tenderness (Murphy sign).  No evidence of cholecystitis on ultrasound.  6:35 AM Patient still having pain but significantly improved from initial presentation.  He has had episodes recently that were similarly long-lived.  I do not believe he meets admission criteria at this time and we will treat him as an outpatient.  He was advised to return for worsening symptoms.  We will refer to [facility] surgery for likely elective cholecystectomy.  PROCEDURES  Procedures   ED DIAGNOSES     ICD-10-CM   1. Biliary colic  K80.50                     Christie Copley, Jonny Ruiz, MD 06/18/21 [phone]

## 2021-06-18 NOTE — Plan of Care (Signed)
PAC initiated

## 2021-06-18 NOTE — ED Triage Notes (Signed)
Pt seen overnight. Was diagnosed with gall stones, instructed to call surgeon for  outpatient followup. Now returns with worsening abdominal pain

## 2021-06-18 NOTE — ED Provider Notes (Signed)
Seen on arrival after transfer from [facility].  Patient is sent for surgical evaluation.  Patient is requesting additional pain medicine.  Surgery is aware of case.  Surgery plans to admit for likely cholecystectomy in the a.m.   Wynetta Fines, MD 06/18/21 [phone]

## 2021-06-18 NOTE — ED Triage Notes (Signed)
Patient here POV from Home with CP.   Patient states he had an Acute Onset of Right Sided CP that radiates to the Right Back that awoke the Patient at 0030 today. Pain worse with Deep Inspiration.  Moderate Nausea for approximately 1 week. No Vomiting. No Diarrhea. No SOB.

## 2021-06-18 NOTE — ED Notes (Signed)
Patient placed on 2LNC at this time d/t desaturations (85%) following fentanyl administration. RN aware.

## 2021-06-18 NOTE — ED Notes (Signed)
Patient transported to Ultrasound 

## 2021-06-18 NOTE — H&P (Signed)
Admission Note  Edward Curtis 1956/06/16  [id number].    Requesting MD: Dr. Rodena Medin Chief Complaint/Reason for Consult: Right upper quadrant pain, symptomatic cholelithiasis  HPI:  65 year old male with medical history significant for cardiomyopathy, palpitations, coronary artery disease status post bypass, GERD who presented [facility] on 2 occasions today due to severe ongoing right upper quadrant pain.  Patient reports he has had right upper quadrant pain over the past 6 weeks which has steadily been getting worse.  Pain is exacerbated after eating especially after eating fatty foods.  He had associated nausea and emesis today.  He reports some intermittent chills.  No fever.  He has GERD at baseline and reports worsened symptoms today.  ROS otherwise as below.  Work-up in the ED significant for right upper quadrant ultrasound showing mildly dilated gallbladder with sludge and gallstones without wall thickening or pericholecystic fluid.  WBC and LFTs within normal limits except for AST of 14.  General surgery was asked to see  Patient reports history of abdominal surgery for a cyst underneath the umbilicus in the 1980s.  He reports the cyst was infected and had to be excised and he was admitted with treatment including IV antibiotics.  He has chronic musculoskeletal pain for which he takes Percocet at baseline.  Substance use: None Blood thinners: Aspirin   ROS: Review of Systems  Constitutional:  Positive for chills. Negative for fever.  Respiratory:  Negative for cough and shortness of breath.   Cardiovascular:  Negative for chest pain and leg swelling.  Gastrointestinal:  Positive for abdominal pain, heartburn, nausea and vomiting. Negative for constipation and diarrhea.   Family History  Problem Relation Age of Onset   Arthritis Mother    Hyperlipidemia Mother    Heart disease Mother    Hypertension Mother    Heart disease Father    Hypertension Father     Diabetes Father    Arthritis Paternal Grandmother    Diabetes Paternal Grandmother    Colon cancer Paternal Grandfather    Heart disease Paternal Grandfather    Diabetes Paternal Grandfather    Hyperlipidemia Maternal Grandmother    Hypertension Other        both sets of parents   Esophageal cancer Neg Hx    Rectal cancer Neg Hx    Stomach cancer Neg Hx     Past Medical History:  Diagnosis Date   Allergy    seasonal allergies    Arthritis    Asthma    Blood transfusion without reported diagnosis 10/26/1983   Calcium oxalate renal stones    early 1990's   Cataract    Chicken pox    Chronic neck pain    Depression    GERD (gastroesophageal reflux disease)    Hyperlipidemia 10/10/2013   Hypertrophic obstructive cardiomyopathy(425.11) 10/10/2013   IBS (irritable bowel syndrome) 12/07/2011   Migraines    Neuromuscular disorder (HCC)    radiculopathy left side    Obesity (BMI 30-39.9) 10/10/2013   Seasonal allergies     Past Surgical History:  Procedure Laterality Date   CATARACT EXTRACTION Left 10/2020   CORONARY STENT INTERVENTION N/A 03/23/2019   Procedure: CORONARY STENT INTERVENTION;  Surgeon: Marykay Lex, MD;  Location: [facility] INVASIVE CV LAB;  Service: Cardiovascular;  Laterality: N/A;   LEFT HEART CATH AND CORONARY ANGIOGRAPHY N/A 03/23/2019   Procedure: LEFT HEART CATH AND CORONARY ANGIOGRAPHY;  Surgeon: Marykay Lex, MD;  Location: [facility] INVASIVE CV LAB;  Service: Cardiovascular;  Laterality: N/A;   left knee surgery  1990   MOUTH SURGERY  1975/1993   pre cancerous mole  2013   prostate procedure  1980   umbilical cyst  1985    Social History:  reports that he has never smoked. He has never used smokeless tobacco. He reports current alcohol use of about 1.0 standard drink per week. He reports that he does not use drugs.  Allergies:  Allergies  Allergen Reactions   Ciprofloxacin Other (See Comments)    Possible "leg swelling, pain, unable to walk"    Iodinated Diagnostic Agents Anaphylaxis, Hives, Itching and Swelling    Patient must be premedicated with 13 hour protocol prior to IV contrast administration   Penicillins Hives    Did it involve swelling of the face/tongue/throat, SOB, or low BP? No Did it involve sudden or severe rash/hives, skin peeling, or any reaction on the inside of your mouth or nose? Yes Did you need to seek medical attention at a hospital or doctor's office? Yes When did it last happen?      25 + years ago If all above answers are "NO", may proceed with cephalosporin use.    Yellow Jacket Venom Anaphylaxis    Swelling    Atorvastatin     myaglias   Clindamycin/Lincomycin Diarrhea and Nausea Only   Erythromycin Diarrhea and Nausea Only   Ibuprofen Other (See Comments)    Causes tachycardia   Keflex [Cephalexin]     Rash hives   Other Itching and Other (See Comments)     Walnut trees and walnuts , sores in mouth   Rosuvastatin     myaligias   Sulfa Antibiotics Hives   Latex Rash    Developed rash 06/2017 that progressed.  Allergy testing positive for latex allergy.  Later determined rash was 2/2 latex in [employer] foods.    (Not in a hospital admission)   Blood pressure 121/68, pulse 94, temperature 98.9 F (37.2 C), temperature source Oral, resp. rate 18, SpO2 99 %. Physical Exam:  General: pleasant, WD, male who is laying in bed in NAD HEENT: head is normocephalic, atraumatic.  Sclera are noninjected.  PERRL.  Ears and nose without any masses or lesions.  Mouth is pink and moist Heart: regular, rate, and rhythm.  Normal s1,s2. No obvious murmurs, gallops, or rubs noted.  Palpable radial and pedal pulses bilaterally Lungs: CTAB, no wheezes, rhonchi, or rales noted.  Respiratory effort nonlabored Abd: soft, ND, +BS, no masses, hernias, or organomegaly.  Mild to moderate focal tenderness to palpation of right upper quadrant.  No rebound or guarding.  Well-healed infraumbilical surgical scar MS: all 4  extremities are symmetrical with no cyanosis, clubbing, or edema. Skin: warm and dry with no masses, lesions, or rashes Neuro: Cranial nerves 2-12 grossly intact, sensation is normal throughout Psych: A&Ox3 with an appropriate affect.   Results for orders placed or performed during the hospital encounter of 06/18/21 (from the past 48 hour(s))  CBC with Differential     Status: Abnormal   Collection Time: 06/18/21 11:45 AM  Result Value Ref Range   WBC 14.6 (H) 4.0 - 10.5 K/uL   RBC 4.63 4.22 - 5.81 MIL/uL   Hemoglobin 14.8 13.0 - 17.0 g/dL   HCT 70.3 50.0 - 93.8 %   MCV 92.9 80.0 - 100.0 fL   MCH 32.0 26.0 - 34.0 pg   MCHC 34.4 30.0 - 36.0 g/dL   RDW 18.2 99.3 - 71.6 %  Platelets 196 150 - 400 K/uL   nRBC 0.0 0.0 - 0.2 %   Neutrophils Relative % 90 %   Neutro Abs 13.3 (H) 1.7 - 7.7 K/uL   Lymphocytes Relative 3 %   Lymphs Abs 0.4 (L) 0.7 - 4.0 K/uL   Monocytes Relative 6 %   Monocytes Absolute 0.8 0.1 - 1.0 K/uL   Eosinophils Relative 0 %   Eosinophils Absolute 0.0 0.0 - 0.5 K/uL   Basophils Relative 0 %   Basophils Absolute 0.0 0.0 - 0.1 K/uL   Immature Granulocytes 1 %   Abs Immature Granulocytes 0.07 0.00 - 0.07 K/uL    Comment: Performed at [facility], [address] [place], [place] [address]  Comprehensive metabolic panel     Status: Abnormal   Collection Time: 06/18/21 11:45 AM  Result Value Ref Range   Sodium 140 135 - 145 mmol/L   Potassium 4.0 3.5 - 5.1 mmol/L   Chloride 105 98 - 111 mmol/L   CO2 23 22 - 32 mmol/L   Glucose, Bld 148 (H) 70 - 99 mg/dL    Comment: Glucose reference range applies only to samples taken after fasting for at least 8 hours.   BUN 11 8 - 23 mg/dL   Creatinine, Ser 1.91 0.61 - 1.24 mg/dL   Calcium 9.6 8.9 - 47.8 mg/dL   Total Protein 7.0 6.5 - 8.1 g/dL   Albumin 4.3 3.5 - 5.0 g/dL   AST 14 (L) 15 - 41 U/L   ALT 12 0 - 44 U/L   Alkaline Phosphatase 69 38 - 126 U/L   Total Bilirubin 0.8 0.3 - 1.2 mg/dL   GFR,  Estimated >29 >56 mL/min    Comment: (NOTE) Calculated using the CKD-EPI Creatinine Equation (2021)    Anion gap 12 5 - 15    Comment: Performed at [facility], [address] [place], [place] [address]  Lipase, blood     Status: None   Collection Time: 06/18/21 11:45 AM  Result Value Ref Range   Lipase 26 11 - 51 U/L    Comment: Performed at [facility], [address] [place], [place] [address]  Urinalysis, Routine w reflex microscopic     Status: Abnormal   Collection Time: 06/18/21 11:45 AM  Result Value Ref Range   Color, Urine YELLOW YELLOW   APPearance CLEAR CLEAR   Specific Gravity, Urine 1.015 1.005 - 1.030   pH 5.5 5.0 - 8.0   Glucose, UA NEGATIVE NEGATIVE mg/dL   Hgb urine dipstick TRACE (A) NEGATIVE   Bilirubin Urine NEGATIVE NEGATIVE   Ketones, ur 15 (A) NEGATIVE mg/dL   Protein, ur NEGATIVE NEGATIVE mg/dL   Nitrite NEGATIVE NEGATIVE   Leukocytes,Ua NEGATIVE NEGATIVE   RBC / HPF 0-5 0 - 5 RBC/hpf   WBC, UA 0-5 0 - 5 WBC/hpf   Bacteria, UA RARE (A) NONE SEEN   Mucus PRESENT     Comment: Performed at [facility], [address], [place], [place] [address]  Troponin I (High Sensitivity)     Status: None   Collection Time: 06/18/21 11:45 AM  Result Value Ref Range   Troponin I (High Sensitivity) 2 <18 ng/L    Comment: (NOTE) Elevated high sensitivity troponin I (hsTnI) values and significant  changes across serial measurements may suggest ACS but many other  chronic and acute conditions are known to elevate hsTnI results.  Refer to the "Links" section for chest pain algorithms and additional  guidance. Performed at [facility], [address], [place], [place] [address]   Troponin I (High Sensitivity)     Status: None   Collection Time: 06/18/21  2:10 PM  Result Value Ref Range   Troponin I (High Sensitivity) 3 <18 ng/L    Comment: (NOTE) Elevated high sensitivity troponin I  (hsTnI) values and significant  changes across serial measurements may suggest ACS but many other  chronic and acute conditions are known to elevate hsTnI results.  Refer to the "Links" section for chest pain algorithms and additional  guidance. Performed at [facility], [address], [place], [place] [address]   Resp Panel by RT-PCR (Flu A&B, Covid) Nasopharyngeal Swab     Status: None   Collection Time: 06/18/21  2:25 PM   Specimen: Nasopharyngeal Swab; Nasopharyngeal(NP) swabs in vial transport medium  Result Value Ref Range   SARS Coronavirus 2 by RT PCR NEGATIVE NEGATIVE    Comment: (NOTE) SARS-CoV-2 target nucleic acids are NOT DETECTED.  The SARS-CoV-2 RNA is generally detectable in upper respiratory specimens during the acute phase of infection. The lowest concentration of SARS-CoV-2 viral copies this assay can detect is 138 copies/mL. A negative result does not preclude SARS-Cov-2 infection and should not be used as the sole basis for treatment or other patient management decisions. A negative result may occur with  improper specimen collection/handling, submission of specimen other than nasopharyngeal swab, presence of viral mutation(s) within the areas targeted by this assay, and inadequate number of viral copies(<138 copies/mL). A negative result must be combined with clinical observations, patient history, and epidemiological information. The expected result is Negative.  Fact Sheet for Patients:  BloggerCourse.comhttps://www.fda.gov/media/152166/download  Fact Sheet for Healthcare Providers:  SeriousBroker.ithttps://www.fda.gov/media/152162/download  This test is no t yet approved or cleared by the [place] FDA and  has been authorized for detection and/or diagnosis of SARS-CoV-2 by FDA under an Emergency Use Authorization (EUA). This EUA will remain  in effect (meaning this test can be used) for the duration of the COVID-19 declaration under Section 564(b)(1) of the  Act, 21 U.S.C.section 360bbb-3(b)(1), unless the authorization is terminated  or revoked sooner.       Influenza A by PCR NEGATIVE NEGATIVE   Influenza B by PCR NEGATIVE NEGATIVE    Comment: (NOTE) The Xpert Xpress SARS-CoV-2/FLU/RSV plus assay is intended as an aid in the diagnosis of influenza from Nasopharyngeal swab specimens and should not be used as a sole basis for treatment. Nasal washings and aspirates are unacceptable for Xpert Xpress SARS-CoV-2/FLU/RSV testing.  Fact Sheet for Patients: BloggerCourse.comhttps://www.fda.gov/media/152166/download  Fact Sheet for Healthcare Providers: SeriousBroker.ithttps://www.fda.gov/media/152162/download  This test is not yet approved or cleared by the [place] FDA and has been authorized for detection and/or diagnosis of SARS-CoV-2 by FDA under an Emergency Use Authorization (EUA). This EUA will remain in effect (meaning this test can be used) for the duration of the COVID-19 declaration under Section 564(b)(1) of the Act, 21 U.S.C. section 360bbb-3(b)(1), unless the authorization is terminated or revoked.  Performed at [facility], [address], [place], [place] [address]    DG Chest Portable 1 View  Result Date: 06/18/2021 CLINICAL DATA:  Chest pain on the right EXAM: PORTABLE CHEST 1 VIEW COMPARISON:  04/24/2021 FINDINGS: The heart size and mediastinal contours are within normal limits. Both lungs are clear. The visualized skeletal structures are unremarkable. IMPRESSION: No acute abnormality noted. Electronically Signed   By: Alcide CleverMark  Lukens M.D.   On: 06/18/2021 02:38  US Abdomen Limited RUQ (LIVER/GB)  Result Date: 06/18/2021 CLINICAL DATA:  Right upper quadrant ultrasound, increased pain EXAM: ULTRASOUND ABDOMEN LIMITED RIGHT UPPER QUADRANT COMPARISON:  None. FINDINGS: Gallbladder: Again seen is amorphous echogenic nonshadowing material layering dependently in the gallbladder compatible with biliary sludge. There are tiny echogenic  foci which likely represent small stones. The gallbladder is mildly dilated with normal wall thickness and no pericholecystic fluid. Negative sonographic Eulah Pont sign reported by the sonographer. Common bile duct: Diameter: 4.3 mm Liver: No focal lesion identified. Within normal limits in parenchymal echogenicity. Portal vein is patent on color Doppler imaging with normal direction of blood flow towards the liver. Other: None. IMPRESSION: Persistent mildly dilated gallbladder filled with layering sludge and tiny gallstones. No wall thickening or pericholecystic fluid. Negative sonographic Eulah Pont sign reported by the sonographer. Correlate with laboratory findings and physical exam. Electronically Signed   By: Caprice Renshaw M.D.   On: 06/18/2021 13:39   US Abdomen Limited RUQ (LIVER/GB)  Result Date: 06/18/2021 CLINICAL DATA:  65 year old male with history of right upper quadrant abdominal pain and chest pain. Nausea for 1 week. EXAM: ULTRASOUND ABDOMEN LIMITED RIGHT UPPER QUADRANT COMPARISON:  Abdominal ultrasound 05/20/2020. FINDINGS: Gallbladder: There is some amorphous echogenic nonshadowing material lying dependently in the gallbladder, compatible with biliary sludge. In addition, multiple more echogenic foci are noted some of which demonstrate trace amounts of posterior acoustic shadowing, likely to represent tiny calculi. Gallbladder is moderately distended. Gallbladder wall thickness is normal (1.7 mm). No pericholecystic fluid. Per report from the sonographer, the patient did not exhibit a sonographic Murphy's sign on examination. Common bile duct: Diameter: 5.3 mm in the porta hepatis. Liver: No focal lesion identified. Within normal limits in parenchymal echogenicity. Portal vein is patent on color Doppler imaging with normal direction of blood flow towards the liver. Other: None. IMPRESSION: 1. Study is positive for biliary sludge and tiny gallstones in the gallbladder. However, there are no imaging  findings to indicate an acute cholecystitis at this time. Electronically Signed   By: Trudie Reed M.D.   On: 06/18/2021 05:25      Assessment/Plan Symptomatic cholelithiasis, possible early cholecystitis Admit for observation with pain control and antiemetics He has numerous allergies to antibiotics.  Will discuss with MD antibiotic options We will plan for cholecystectomy tomorrow I have explained the procedure, risks, and aftercare of cholecystectomy.  Risks include but are not limited to bleeding, infection, wound problems, anesthesia, diarrhea, bile leak, injury to common bile duct/liver/intestine.  He/she seems to understand and agrees to proceed.  FEN: Carb modified, n.p.o. at midnight, IVF at 75 ml/hr ID: None currently VTE: Lovenox  Coronary artery disease s/p bypass graft -hold home aspirin.  Resume home CCB and cholesterol medication Chronic pain -Multimodal pain control GERD - Home famotidine  Eric Form, [facility] Surgery 06/18/2021, 4:39 PM Please see Amion for pager number during day hours 7:00am-4:30pm

## 2021-06-18 NOTE — ED Provider Notes (Signed)
[facility] EMERGENCY DEPT Provider Note   CSN: [id number] Arrival date & time: 06/18/21  1003     History Chief Complaint  Patient presents with   Abdominal Pain    Edward Curtis is a 65 y.o. male.   Abdominal Pain Associated symptoms: chills, nausea and vomiting   Associated symptoms: no chest pain, no cough, no dysuria, no fever, no hematuria, no shortness of breath and no sore throat    65 year old male with past medical history below presenting to the emergency department with persistent biliary colic.  Patient was seen overnight in the emergency department with positive Murphy sign, ultrasound without evidence of cholecystitis but positive for cholelithiasis.  The patient was referred to [place] [facility] with plan for elective cholecystectomy outpatient.  He states that since discharge, his pain has worsened significantly.  He has been unable to tolerate any oral intake due to persistent nausea and vomiting.  He endorses worsening right upper quadrant pain that radiates to his epigastrium.  He endorses chills, no fevers.  He was told to present back to the emergency department for worsening symptoms and is doing so.  Past Medical History:  Diagnosis Date   Allergy    seasonal allergies    Arthritis    Asthma    Blood transfusion without reported diagnosis 10/26/1983   Calcium oxalate renal stones    early 1990's   Cataract    Chicken pox    Chronic neck pain    Depression    GERD (gastroesophageal reflux disease)    Hyperlipidemia 10/10/2013   Hypertrophic obstructive cardiomyopathy(425.11) 10/10/2013   IBS (irritable bowel syndrome) 12/07/2011   Migraines    Neuromuscular disorder (HCC)    radiculopathy left side    Obesity (BMI 30-39.9) 10/10/2013   Seasonal allergies     Patient Active Problem List   Diagnosis Date Noted   Arthropathy of left temporomandibular joint 06/04/2021   Eczema 04/20/2021   Seasonal allergies    Neuromuscular  disorder (HCC)    Migraines    GERD (gastroesophageal reflux disease)    Depression    Chronic neck pain    Chicken pox    Calcium oxalate renal stones    Asthma    Arthritis    Allergy    Myalgia due to statin 05/14/2020   Coronary artery disease involving native coronary artery of native heart with angina pectoris (HCC) 03/30/2019   Angina, class III (HCC) 03/16/2019    Class: Question of   Allergy to IVP dye 03/16/2019   Chest discomfort 03/08/2019   Edema 08/31/2018   Hypogonadism in male 11/25/2017   Scabies 08/20/2017   Nasal abrasion 08/20/2017   Anxiety and depression 11/12/2013   Prediabetes 11/12/2013   Obesity (BMI 30-39.9) 10/10/2013   Hyperlipidemia 10/10/2013   Hypertrophic cardiomyopathy (HCC)    IBS (irritable bowel syndrome) 12/07/2011   HIstory of SVT    Chronic back pain    Neck injury 11/25/2000   Blood transfusion without reported diagnosis 1985    Past Surgical History:  Procedure Laterality Date   CATARACT EXTRACTION Left 10/2020   CORONARY STENT INTERVENTION N/A 03/23/2019   Procedure: CORONARY STENT INTERVENTION;  Surgeon: Marykay Lex, MD;  Location: [facility] INVASIVE CV LAB;  Service: Cardiovascular;  Laterality: N/A;   LEFT HEART CATH AND CORONARY ANGIOGRAPHY N/A 03/23/2019   Procedure: LEFT HEART CATH AND CORONARY ANGIOGRAPHY;  Surgeon: Marykay Lex, MD;  Location: [facility] INVASIVE CV LAB;  Service: Cardiovascular;  Laterality: N/A;  left knee surgery  1990   MOUTH SURGERY  1975/1993   pre cancerous mole  2013   prostate procedure  1980   umbilical cyst  1985       Family History  Problem Relation Age of Onset   Arthritis Mother    Hyperlipidemia Mother    Heart disease Mother    Hypertension Mother    Heart disease Father    Hypertension Father    Diabetes Father    Arthritis Paternal Grandmother    Diabetes Paternal Grandmother    Colon cancer Paternal Grandfather    Heart disease Paternal Grandfather    Diabetes Paternal  Grandfather    Hyperlipidemia Maternal Grandmother    Hypertension Other        both sets of parents   Esophageal cancer Neg Hx    Rectal cancer Neg Hx    Stomach cancer Neg Hx     Social History   Tobacco Use   Smoking status: Never   Smokeless tobacco: Never  Vaping Use   Vaping Use: Never used  Substance Use Topics   Alcohol use: Yes    Alcohol/week: 1.0 standard drink    Types: 1 Glasses of wine per week    Comment: once a month   Drug use: No    Home Medications Prior to Admission medications   Medication Sig Start Date End Date Taking? Authorizing Provider  aspirin EC 81 MG tablet Take 81 mg by mouth daily.   Yes [provider]  Cyanocobalamin (VITAMIN B 12) 500 MCG TABS Take 500 mcg by mouth daily. 03/25/20  Yes [provider]  famotidine (PEPCID) 20 MG tablet Take 1 tablet (20 mg total) by mouth 2 (two) times daily. 04/25/21  Yes Terrilee FilesButler, Michael C, MD  ketoconazole (NIZORAL) 2 % cream Apply topically daily. 02/18/21  Yes [provider]  Melatonin 1 MG CHEW Chew 1 mg by mouth daily. 10/26/19  Yes [provider]  methocarbamol (ROBAXIN) 500 MG tablet Take 1 tablet (500 mg total) by mouth every 6 (six) hours as needed for muscle spasms. 06/04/21  Yes Nelwyn SalisburyFry, Stephen A, MD  ondansetron (ZOFRAN ODT) 8 MG disintegrating tablet Take 1 tablet (8 mg total) by mouth every 8 (eight) hours as needed for nausea or vomiting. 06/18/21  Yes Molpus, John, MD  valACYclovir (VALTREX) 500 MG tablet Take 500 mg by mouth daily as needed (cold sores). Take for 5 days at first sign of outbreak   Yes [provider]  verapamil (CALAN) 80 MG tablet TAKE ONE TABLET DAILY AS NEEDED 04/20/21  Yes Baldo DaubMunley, Brian J, MD  verapamil (CALAN-SR) 180 MG CR tablet TAKE ONE TABLET DAILY 11/27/20  Yes Baldo DaubMunley, Brian J, MD  diphenhydrAMINE (BENADRYL) 25 MG tablet Take 25 mg by mouth daily as needed (allergic reactions).    [provider]  EPINEPHrine (EPIPEN 2-PAK) 0.3  mg/0.3 mL IJ SOAJ injection Inject 0.3 mLs (0.3 mg total) into the muscle once as needed (for severe allergic reaction). CAll 911 immediately if you have to use this medicine 05/29/20   Nelwyn SalisburyFry, Stephen A, MD  Evolocumab (REPATHA SURECLICK) 140 MG/ML SOAJ INJECT 140MG  INTO THE SKIN EVERY 14 DAYS 05/25/21   Baldo DaubMunley, Brian J, MD  ezetimibe (ZETIA) 10 MG tablet TAKE ONE TABLET EACH DAY 12/17/20   Baldo DaubMunley, Brian J, MD  nitroGLYCERIN (NITROSTAT) 0.4 MG SL tablet ONE TABLET UNDER TONGUE EVERY 5 MINUTES AS NEEDED FOR CHEST PAIN 05/25/21   Baldo DaubMunley, Brian J, MD  oxyCODONE-acetaminophen (PERCOCET) 10-325 MG tablet Take 1 tablet by mouth every 6 (six) hours as needed (For biliary colic pain). 06/18/21   Molpus, John, MD  tadalafil (CIALIS) 5 MG tablet Take 1 tablet (5 mg total) by mouth daily as needed for erectile dysfunction. 06/09/21   [name], MD    Allergies    Ciprofloxacin, Iodinated diagnostic agents, Penicillins, Yellow jacket venom, Atorvastatin, Clindamycin/lincomycin, Erythromycin, Ibuprofen, Keflex [cephalexin], Other, Rosuvastatin, Sulfa antibiotics, and Latex  Review of Systems   Review of Systems  Constitutional:  Positive for chills. Negative for fever.  HENT:  Negative for ear pain and sore throat.   Eyes:  Negative for pain and visual disturbance.  Respiratory:  Negative for cough and shortness of breath.   Cardiovascular:  Negative for chest pain and palpitations.  Gastrointestinal:  Positive for abdominal pain, nausea and vomiting.  Genitourinary:  Negative for dysuria and hematuria.  Musculoskeletal:  Negative for arthralgias and back pain.  Skin:  Negative for color change and rash.  Neurological:  Negative for seizures and syncope.  All other systems reviewed and are negative.  Physical Exam Updated Vital Signs BP (!) 107/57   Pulse 83   Temp 98.9 F (37.2 C) (Oral)   Resp 17   SpO2 97%   Physical Exam Vitals and nursing note reviewed.  Constitutional:      Appearance: He is  well-developed.  HENT:     Head: Normocephalic and atraumatic.  Eyes:     Conjunctiva/sclera: Conjunctivae normal.  Cardiovascular:     Rate and Rhythm: Normal rate and regular rhythm.     Heart sounds: No murmur heard. Pulmonary:     Effort: Pulmonary effort is normal. No respiratory distress.     Breath sounds: Normal breath sounds.  Abdominal:     Palpations: Abdomen is soft.     Tenderness: There is abdominal tenderness in the right upper quadrant and epigastric area. There is guarding. There is no rebound. Positive signs include Murphy's sign.  Musculoskeletal:     Cervical back: Neck supple.  Skin:    General: Skin is warm and dry.  Neurological:     Mental Status: He is alert.    ED Results / Procedures / Treatments   Labs (all labs ordered are listed, but only abnormal results are displayed) Labs Reviewed  CBC WITH DIFFERENTIAL/PLATELET - Abnormal; Notable for the following components:      Result Value   WBC 14.6 (*)    Neutro Abs 13.3 (*)    Lymphs Abs 0.4 (*)    All other components within normal limits  COMPREHENSIVE METABOLIC PANEL - Abnormal; Notable for the following components:   Glucose, Bld 148 (*)    AST 14 (*)    All other components within normal limits  URINALYSIS, ROUTINE W REFLEX MICROSCOPIC - Abnormal; Notable for the following components:   Hgb urine dipstick TRACE (*)    Ketones, ur 15 (*)    Bacteria, UA RARE (*)    All other components within normal limits  RESP PANEL BY RT-PCR (FLU A&B, COVID) ARPGX2  LIPASE, BLOOD  TROPONIN I (HIGH SENSITIVITY)  TROPONIN I (HIGH SENSITIVITY)    EKG None  Radiology DG Chest Portable 1 View  Result Date: 06/18/2021 CLINICAL DATA:  Chest pain on the right EXAM: PORTABLE CHEST 1 VIEW COMPARISON:  04/24/2021 FINDINGS: The heart size and mediastinal contours are within normal limits. Both lungs are clear. The visualized skeletal structures are unremarkable. IMPRESSION: No acute abnormality  noted.  Electronically Signed   By: Alcide Clever M.D.   On: 06/18/2021 02:38   US Abdomen Limited RUQ (LIVER/GB)  Result Date: 06/18/2021 CLINICAL DATA:  Right upper quadrant ultrasound, increased pain EXAM: ULTRASOUND ABDOMEN LIMITED RIGHT UPPER QUADRANT COMPARISON:  None. FINDINGS: Gallbladder: Again seen is amorphous echogenic nonshadowing material layering dependently in the gallbladder compatible with biliary sludge. There are tiny echogenic foci which likely represent small stones. The gallbladder is mildly dilated with normal wall thickness and no pericholecystic fluid. Negative sonographic Eulah Pont sign reported by the sonographer. Common bile duct: Diameter: 4.3 mm Liver: No focal lesion identified. Within normal limits in parenchymal echogenicity. Portal vein is patent on color Doppler imaging with normal direction of blood flow towards the liver. Other: None. IMPRESSION: Persistent mildly dilated gallbladder filled with layering sludge and tiny gallstones. No wall thickening or pericholecystic fluid. Negative sonographic Eulah Pont sign reported by the sonographer. Correlate with laboratory findings and physical exam. Electronically Signed   By: Caprice Renshaw M.D.   On: 06/18/2021 13:39   US Abdomen Limited RUQ (LIVER/GB)  Result Date: 06/18/2021 CLINICAL DATA:  65 year old male with history of right upper quadrant abdominal pain and chest pain. Nausea for 1 week. EXAM: ULTRASOUND ABDOMEN LIMITED RIGHT UPPER QUADRANT COMPARISON:  Abdominal ultrasound 05/20/2020. FINDINGS: Gallbladder: There is some amorphous echogenic nonshadowing material lying dependently in the gallbladder, compatible with biliary sludge. In addition, multiple more echogenic foci are noted some of which demonstrate trace amounts of posterior acoustic shadowing, likely to represent tiny calculi. Gallbladder is moderately distended. Gallbladder wall thickness is normal (1.7 mm). No pericholecystic fluid. Per report from the sonographer, the  patient did not exhibit a sonographic Murphy's sign on examination. Common bile duct: Diameter: 5.3 mm in the porta hepatis. Liver: No focal lesion identified. Within normal limits in parenchymal echogenicity. Portal vein is patent on color Doppler imaging with normal direction of blood flow towards the liver. Other: None. IMPRESSION: 1. Study is positive for biliary sludge and tiny gallstones in the gallbladder. However, there are no imaging findings to indicate an acute cholecystitis at this time. Electronically Signed   By: Trudie Reed M.D.   On: 06/18/2021 05:25    Procedures Procedures   Medications Ordered in ED Medications  ondansetron (ZOFRAN) injection 4 mg (4 mg Intravenous Given 06/18/21 1146)  sodium chloride 0.9 % bolus 1,000 mL (1,000 mLs Intravenous New Bag/Given 06/18/21 1153)  fentaNYL (SUBLIMAZE) injection 50 mcg (50 mcg Intravenous Given 06/18/21 1145)  fentaNYL (SUBLIMAZE) injection 50 mcg (50 mcg Intravenous Given 06/18/21 1255)  fentaNYL (SUBLIMAZE) injection 50 mcg (50 mcg Intravenous Given 06/18/21 1423)    ED Course  I have reviewed the triage vital signs and the nursing notes.  Pertinent labs & imaging results that were available during my care of the patient were reviewed by me and considered in my medical decision making (see chart for details).    MDM Rules/Calculators/A&P                           65 year old male presenting back to the emergency department with biliary colic.  Concern for developing cholecystitis versus persistent symptomatic cholelithiasis.  On arrival, the patient was afebrile, not tachycardic or tachypneic, hemodynamically stable.  Physical exam significant for right upper quadrant tenderness palpation with some epigastric tenderness, positive Murphy sign.  Will repeat formal ultrasound of the gallbladder, repeat screening labs and administer IV fluids for volume resuscitation, IV antiemetics and IV  fentanyl for pain control.   Labs  obtained significant for a normal lipase, urinalysis without evidence of UTI, CMP without biliary dysfunction, normal LFTs, CBC with a leukocytosis to 14.6, increased from 5.5 overnight, EKG without ischemic changes, troponins x2 negative, COVID-19 and influenza PCR negative.  The patient was administered multiple rounds of IV fentanyl for pain control with persistent right upper quadrant pain.  Repeat right upper quadrant ultrasound revealed persistently mildly to mildly dilated gallbladder filled with layering sludge and tiny gallstones, no wall thickening or pericholecystic fluid, negative sonographic Murphy sign status post IV fentanyl.  General surgery was consulted due to concern for symptomatic cholelithiasis resulting in severe nausea and vomiting and inability to tolerate oral intake.  Plan will be for ED to ED transfer to the [facility] emergency department for general surgery evaluation for consideration for cholecystectomy.  I spoke with Dr. Wilkie Aye at the [facility] emergency department who accepted the patient in transfer to their ED.  Final Clinical Impression(s) / ED Diagnoses Final diagnoses:  RUQ pain  Symptomatic cholelithiasis    Rx / DC Orders ED Discharge Orders     None        Ernie Avena, MD 06/18/21 1614

## 2021-06-18 NOTE — ED Notes (Signed)
RT Note: 12-lead EKG obtained, given to MD.

## 2021-06-19 ENCOUNTER — Ambulatory Visit: Payer: Medicare Other | Admitting: Family Medicine

## 2021-06-19 ENCOUNTER — Observation Stay (HOSPITAL_COMMUNITY): Payer: Medicare Other | Admitting: Certified Registered Nurse Anesthetist

## 2021-06-19 ENCOUNTER — Encounter (HOSPITAL_COMMUNITY)
Admission: EM | Disposition: A | Discharge status: Home / Self Care | Payer: Self-pay | Source: Home / Self Care | Attending: Emergency Medicine

## 2021-06-19 ENCOUNTER — Encounter (HOSPITAL_COMMUNITY): Payer: Self-pay | Admitting: Surgery

## 2021-06-19 DIAGNOSIS — Z20822 Contact with and (suspected) exposure to covid-19: Secondary | ICD-10-CM | POA: Diagnosis not present

## 2021-06-19 DIAGNOSIS — J45909 Unspecified asthma, uncomplicated: Secondary | ICD-10-CM | POA: Diagnosis not present

## 2021-06-19 DIAGNOSIS — I2510 Atherosclerotic heart disease of native coronary artery without angina pectoris: Secondary | ICD-10-CM | POA: Diagnosis not present

## 2021-06-19 DIAGNOSIS — K8012 Calculus of gallbladder with acute and chronic cholecystitis without obstruction: Secondary | ICD-10-CM | POA: Diagnosis not present

## 2021-06-19 HISTORY — PX: CHOLECYSTECTOMY: SHX55

## 2021-06-19 LAB — COMPREHENSIVE METABOLIC PANEL
ALT: 18 U/L (ref 0–44)
AST: 21 U/L (ref 15–41)
Albumin: 4 g/dL (ref 3.5–5.0)
Alkaline Phosphatase: 65 U/L (ref 38–126)
Anion gap: 7 (ref 5–15)
BUN: 8 mg/dL (ref 8–23)
CO2: 24 mmol/L (ref 22–32)
Calcium: 8.9 mg/dL (ref 8.9–10.3)
Chloride: 107 mmol/L (ref 98–111)
Creatinine, Ser: 0.73 mg/dL (ref 0.61–1.24)
GFR, Estimated: >60 mL/min (ref >60)
Glucose, Bld: 96 mg/dL (ref 70–99)
Potassium: 4.3 mmol/L (ref 3.5–5.1)
Sodium: 138 mmol/L (ref 135–145)
Total Bilirubin: 1.7 mg/dL — ABNORMAL HIGH (ref 0.3–1.2)
Total Protein: 6.4 g/dL — ABNORMAL LOW (ref 6.5–8.1)

## 2021-06-19 LAB — CBC
HCT: 42.3 % (ref 39.0–52.0)
Hemoglobin: 13.8 g/dL (ref 13.0–17.0)
MCH: 32.2 pg (ref 26.0–34.0)
MCHC: 32.6 g/dL (ref 30.0–36.0)
MCV: 98.8 fL (ref 80.0–100.0)
Platelets: 168 10*3/uL (ref 150–400)
RBC: 4.28 MIL/uL (ref 4.22–5.81)
RDW: 14.8 % (ref 11.5–15.5)
WBC: 12.2 10*3/uL — ABNORMAL HIGH (ref 4.0–10.5)
nRBC: 0 % (ref 0.0–0.2)

## 2021-06-19 LAB — HIV ANTIBODY (ROUTINE TESTING W REFLEX): HIV Screen 4th Generation wRfx: NONREACTIVE

## 2021-06-19 SURGERY — LAPAROSCOPIC CHOLECYSTECTOMY WITH INTRAOPERATIVE CHOLANGIOGRAM
Anesthesia: General | Site: Abdomen

## 2021-06-19 MED ORDER — ONDANSETRON HCL 4 MG/2ML IJ SOLN: 4.0000 mg | Freq: Once | INTRAMUSCULAR | Status: DC | PRN

## 2021-06-19 MED ORDER — PHENYLEPHRINE 40 MCG/ML (10ML) SYRINGE FOR IV PUSH (FOR BLOOD PRESSURE SUPPORT)
PREFILLED_SYRINGE | INTRAVENOUS | Status: AC
  Filled 2021-06-19: qty 10

## 2021-06-19 MED ORDER — OXYCODONE HCL 5 MG PO TABS
5.0000 mg | ORAL_TABLET | Freq: Once | ORAL | Status: DC | PRN
Start: 2021-06-19 — End: 2021-06-19

## 2021-06-19 MED ORDER — SUGAMMADEX SODIUM 200 MG/2ML IV SOLN
INTRAVENOUS | Status: DC | PRN
  Administered 2021-06-19: 200 mg via INTRAVENOUS

## 2021-06-19 MED ORDER — DEXAMETHASONE SODIUM PHOSPHATE 10 MG/ML IJ SOLN
INTRAMUSCULAR | Status: DC | PRN
  Administered 2021-06-19: 4 mg via INTRAVENOUS

## 2021-06-19 MED ORDER — VANCOMYCIN HCL IN DEXTROSE 1-5 GM/200ML-% IV SOLN
1000.0000 mg | Freq: Once | INTRAVENOUS | Status: AC
  Administered 2021-06-19: 1000 mg via INTRAVENOUS
  Filled 2021-06-19: qty 200

## 2021-06-19 MED ORDER — FENTANYL CITRATE PF 50 MCG/ML IJ SOSY: 25.0000 ug | PREFILLED_SYRINGE | INTRAMUSCULAR | Status: DC | PRN

## 2021-06-19 MED ORDER — FENTANYL CITRATE PF 50 MCG/ML IJ SOSY: 50.0000 ug | PREFILLED_SYRINGE | INTRAMUSCULAR | Status: AC

## 2021-06-19 MED ORDER — BUPIVACAINE-EPINEPHRINE (PF) 0.25% -1:200000 IJ SOLN
INTRAMUSCULAR | Status: AC
  Filled 2021-06-19: qty 30

## 2021-06-19 MED ORDER — ROCURONIUM BROMIDE 100 MG/10ML IV SOLN
INTRAVENOUS | Status: DC | PRN
  Administered 2021-06-19: 70 mg via INTRAVENOUS
  Administered 2021-06-19: 20 mg via INTRAVENOUS

## 2021-06-19 MED ORDER — LACTATED RINGERS IR SOLN
Status: DC | PRN
  Administered 2021-06-19: 1000 mL

## 2021-06-19 MED ORDER — FENTANYL CITRATE PF 50 MCG/ML IJ SOSY
PREFILLED_SYRINGE | INTRAMUSCULAR | Status: AC
  Administered 2021-06-19: 50 ug via INTRAVENOUS
  Filled 2021-06-19: qty 1

## 2021-06-19 MED ORDER — LACTATED RINGERS IV SOLN: INTRAVENOUS | Status: DC | PRN

## 2021-06-19 MED ORDER — OXYCODONE HCL 5 MG/5ML PO SOLN: 5.0000 mg | Freq: Once | ORAL | Status: DC | PRN

## 2021-06-19 MED ORDER — ACETAMINOPHEN 10 MG/ML IV SOLN: 1000.0000 mg | Freq: Once | INTRAVENOUS | Status: DC | PRN

## 2021-06-19 MED ORDER — DEXMEDETOMIDINE (PRECEDEX) IN NS 20 MCG/5ML (4 MCG/ML) IV SYRINGE
PREFILLED_SYRINGE | INTRAVENOUS | Status: DC | PRN
  Administered 2021-06-19: 8 ug via INTRAVENOUS

## 2021-06-19 MED ORDER — FENTANYL CITRATE (PF) 100 MCG/2ML IJ SOLN
INTRAMUSCULAR | Status: DC | PRN
  Administered 2021-06-19: 50 ug via INTRAVENOUS

## 2021-06-19 MED ORDER — ROCURONIUM BROMIDE 10 MG/ML (PF) SYRINGE
PREFILLED_SYRINGE | INTRAVENOUS | Status: AC
  Filled 2021-06-19: qty 10

## 2021-06-19 MED ORDER — ONDANSETRON HCL 4 MG/2ML IJ SOLN
INTRAMUSCULAR | Status: DC | PRN
  Administered 2021-06-19: 4 mg via INTRAVENOUS

## 2021-06-19 MED ORDER — PROPOFOL 10 MG/ML IV BOLUS
INTRAVENOUS | Status: AC
  Filled 2021-06-19: qty 20

## 2021-06-19 MED ORDER — LACTATED RINGERS IV SOLN: INTRAVENOUS | Status: DC

## 2021-06-19 MED ORDER — PHENYLEPHRINE HCL (PRESSORS) 10 MG/ML IV SOLN
INTRAVENOUS | Status: AC
  Filled 2021-06-19: qty 2

## 2021-06-19 MED ORDER — MIDAZOLAM HCL 2 MG/2ML IJ SOLN
INTRAMUSCULAR | Status: AC
  Filled 2021-06-19: qty 2

## 2021-06-19 MED ORDER — AMISULPRIDE (ANTIEMETIC) 5 MG/2ML IV SOLN: 10.0000 mg | Freq: Once | INTRAVENOUS | Status: DC | PRN

## 2021-06-19 MED ORDER — CHLORHEXIDINE GLUCONATE 0.12 % MT SOLN
15.0000 mL | Freq: Once | OROMUCOSAL | Status: AC
  Administered 2021-06-19: 15 mL via OROMUCOSAL

## 2021-06-19 MED ORDER — PHENYLEPHRINE 40 MCG/ML (10ML) SYRINGE FOR IV PUSH (FOR BLOOD PRESSURE SUPPORT)
PREFILLED_SYRINGE | INTRAVENOUS | Status: DC | PRN
  Administered 2021-06-19 (×2): 120 ug via INTRAVENOUS

## 2021-06-19 MED ORDER — VANCOMYCIN HCL IN DEXTROSE 1-5 GM/200ML-% IV SOLN
1000.0000 mg | INTRAVENOUS | Status: AC
  Administered 2021-06-19: 1000 mg via INTRAVENOUS
  Filled 2021-06-19: qty 200

## 2021-06-19 MED ORDER — BUPIVACAINE-EPINEPHRINE 0.25% -1:200000 IJ SOLN
INTRAMUSCULAR | Status: DC | PRN
  Administered 2021-06-19: 16 mL

## 2021-06-19 MED ORDER — PROPOFOL 10 MG/ML IV BOLUS
INTRAVENOUS | Status: DC | PRN
  Administered 2021-06-19: 130 mg via INTRAVENOUS

## 2021-06-19 MED ORDER — LIDOCAINE 2% (20 MG/ML) 5 ML SYRINGE
INTRAMUSCULAR | Status: AC
  Filled 2021-06-19: qty 5

## 2021-06-19 MED ORDER — LIDOCAINE 2% (20 MG/ML) 5 ML SYRINGE
INTRAMUSCULAR | Status: DC | PRN
  Administered 2021-06-19: 20 mg via INTRAVENOUS
  Administered 2021-06-19: 60 mg via INTRAVENOUS

## 2021-06-19 MED ORDER — MIDAZOLAM HCL 5 MG/5ML IJ SOLN
INTRAMUSCULAR | Status: DC | PRN
  Administered 2021-06-19: 2 mg via INTRAVENOUS

## 2021-06-19 MED ORDER — FENTANYL CITRATE (PF) 250 MCG/5ML IJ SOLN
INTRAMUSCULAR | Status: AC
  Filled 2021-06-19: qty 5

## 2021-06-19 SURGICAL SUPPLY — 35 items
APPLIER CLIP ROT 10 11.4 M/L (STAPLE) ×2
BAG COUNTER SPONGE SURGICOUNT (BAG) IMPLANT
CABLE HIGH FREQUENCY MONO STRZ (ELECTRODE) ×2 IMPLANT
CHLORAPREP W/TINT 26 (MISCELLANEOUS) ×2 IMPLANT
CLIP APPLIE ROT 10 11.4 M/L (STAPLE) ×1 IMPLANT
COVER MAYO STAND STRL (DRAPES) IMPLANT
COVER SURGICAL LIGHT HANDLE (MISCELLANEOUS) ×2 IMPLANT
DECANTER SPIKE VIAL GLASS SM (MISCELLANEOUS) ×2 IMPLANT
DERMABOND ADVANCED (GAUZE/BANDAGES/DRESSINGS) ×1
DERMABOND ADVANCED .7 DNX12 (GAUZE/BANDAGES/DRESSINGS) ×1 IMPLANT
DRAPE C-ARM 42X120 X-RAY (DRAPES) IMPLANT
ELECT REM PT RETURN 15FT ADLT (MISCELLANEOUS) ×2 IMPLANT
GLOVE SURG ENC MOIS LTX SZ6 (GLOVE) ×2 IMPLANT
GLOVE SURG MICRO LTX SZ6 (GLOVE) ×2 IMPLANT
GLOVE SURG UNDER LTX SZ6.5 (GLOVE) ×2 IMPLANT
GOWN STRL REUS W/TWL LRG LVL3 (GOWN DISPOSABLE) ×2 IMPLANT
GOWN STRL REUS W/TWL XL LVL3 (GOWN DISPOSABLE) ×4 IMPLANT
GRASPER SUT TROCAR 14GX15 (MISCELLANEOUS) IMPLANT
HEMOSTAT SNOW SURGICEL 2X4 (HEMOSTASIS) IMPLANT
KIT BASIN OR (CUSTOM PROCEDURE TRAY) ×2 IMPLANT
KIT TURNOVER KIT A (KITS) ×2 IMPLANT
NEEDLE INSUFFLATION 14GA 120MM (NEEDLE) ×2 IMPLANT
PENCIL SMOKE EVACUATOR (MISCELLANEOUS) IMPLANT
POUCH SPECIMEN RETRIEVAL 10MM (ENDOMECHANICALS) ×2 IMPLANT
SCISSORS LAP 5X35 DISP (ENDOMECHANICALS) ×2 IMPLANT
SET CHOLANGIOGRAPH MIX (MISCELLANEOUS) IMPLANT
SET IRRIG TUBING LAPAROSCOPIC (IRRIGATION / IRRIGATOR) ×2 IMPLANT
SET TUBE SMOKE EVAC HIGH FLOW (TUBING) ×2 IMPLANT
SLEEVE XCEL OPT CAN 5 100 (ENDOMECHANICALS) ×4 IMPLANT
SUT MNCRL AB 4-0 PS2 18 (SUTURE) ×2 IMPLANT
TOWEL OR 17X26 10 PK STRL BLUE (TOWEL DISPOSABLE) ×2 IMPLANT
TOWEL OR NON WOVEN STRL DISP B (DISPOSABLE) IMPLANT
TRAY LAPAROSCOPIC (CUSTOM PROCEDURE TRAY) ×2 IMPLANT
TROCAR BLADELESS OPT 5 100 (ENDOMECHANICALS) ×2 IMPLANT
TROCAR XCEL 12X100 BLDLESS (ENDOMECHANICALS) ×2 IMPLANT

## 2021-06-19 NOTE — Transfer of Care (Signed)
Immediate Anesthesia Transfer of Care Note  Patient: Edward Curtis  Procedure(s) Performed: LAPAROSCOPIC CHOLECYSTECTOMY WITH POSSIBLE INTRAOPERATIVE CHOLANGIOGRAM (Abdomen)  Patient Location: PACU  Anesthesia Type:General  Level of Consciousness: sedated  Airway & Oxygen Therapy: Patient Spontanous Breathing and Patient connected to face mask oxygen  Post-op Assessment: Report given to RN and Post -op Vital signs reviewed and stable  Post vital signs: Reviewed and stable  Last Vitals:  Vitals Value Taken Time  BP 125/91 06/19/21 1305  Temp    Pulse 91 06/19/21 1308  Resp 14 06/19/21 1308  SpO2 96 % 06/19/21 1308  Vitals shown include unvalidated device data.  Last Pain:  Vitals:   06/19/21 1123  TempSrc:   PainSc: 10-Worst pain ever      Patients Stated Pain Goal: 8 (06/19/21 1123)  Complications: No notable events documented.

## 2021-06-19 NOTE — Progress Notes (Signed)
Day of Surgery   Subjective/Chief Complaint: Pain is a bit worse this morning.   Objective: Vital signs in last 24 hours: Temp:  [98.4 F (36.9 C)-100.5 F (38.1 C)] 100.5 F (38.1 C) (08/26 0917) Pulse Rate:  [71-108] 108 (08/26 0917) Resp:  [14-20] 18 (08/26 0917) BP: (107-141)/(57-86) 129/81 (08/26 0917) SpO2:  [92 %-100 %] 92 % (08/26 0917) Weight:  [86.2 kg] 86.2 kg (08/25 2119) Last BM Date: 06/17/21  Intake/Output from previous day: 08/25 0701 - 08/26 0700 In: 994.9 [P.O.:240; I.V.:754.9] Out: 1050 [Urine:1050] Intake/Output this shift: No intake/output data recorded.  General appearance: alert and cooperative Resp: Unlabored Cardio: regular rate and rhythm GI: Soft, nondistended, tender in the right upper quadrant Skin: Skin color, texture, turgor normal. No rashes or lesions Neurologic: Grossly normal  Lab Results:  Recent Labs    06/18/21 1906 06/19/21 0426  WBC 13.4* 12.2*  HGB 14.3 13.8  HCT 41.8 42.3  PLT 192 168   BMET Recent Labs    06/18/21 1145 06/18/21 1906 06/19/21 0426  NA 140  --  138  K 4.0  --  4.3  CL 105  --  107  CO2 23  --  24  GLUCOSE 148*  --  96  BUN 11  --  8  CREATININE 0.79 0.77 0.73  CALCIUM 9.6  --  8.9   PT/INR No results for input(s): LABPROT, INR in the last 72 hours. ABG No results for input(s): PHART, HCO3 in the last 72 hours.  Invalid input(s): PCO2, PO2  Studies/Results: DG Chest Portable 1 View  Result Date: 06/18/2021 CLINICAL DATA:  Chest pain on the right EXAM: PORTABLE CHEST 1 VIEW COMPARISON:  04/24/2021 FINDINGS: The heart size and mediastinal contours are within normal limits. Both lungs are clear. The visualized skeletal structures are unremarkable. IMPRESSION: No acute abnormality noted. Electronically Signed   By: Alcide Clever M.D.   On: 06/18/2021 02:38   US Abdomen Limited RUQ (LIVER/GB)  Result Date: 06/18/2021 CLINICAL DATA:  Right upper quadrant ultrasound, increased pain EXAM:  ULTRASOUND ABDOMEN LIMITED RIGHT UPPER QUADRANT COMPARISON:  None. FINDINGS: Gallbladder: Again seen is amorphous echogenic nonshadowing material layering dependently in the gallbladder compatible with biliary sludge. There are tiny echogenic foci which likely represent small stones. The gallbladder is mildly dilated with normal wall thickness and no pericholecystic fluid. Negative sonographic Eulah Pont sign reported by the sonographer. Common bile duct: Diameter: 4.3 mm Liver: No focal lesion identified. Within normal limits in parenchymal echogenicity. Portal vein is patent on color Doppler imaging with normal direction of blood flow towards the liver. Other: None. IMPRESSION: Persistent mildly dilated gallbladder filled with layering sludge and tiny gallstones. No wall thickening or pericholecystic fluid. Negative sonographic Eulah Pont sign reported by the sonographer. Correlate with laboratory findings and physical exam. Electronically Signed   By: Caprice Renshaw M.D.   On: 06/18/2021 13:39   US Abdomen Limited RUQ (LIVER/GB)  Result Date: 06/18/2021 CLINICAL DATA:  65 year old male with history of right upper quadrant abdominal pain and chest pain. Nausea for 1 week. EXAM: ULTRASOUND ABDOMEN LIMITED RIGHT UPPER QUADRANT COMPARISON:  Abdominal ultrasound 05/20/2020. FINDINGS: Gallbladder: There is some amorphous echogenic nonshadowing material lying dependently in the gallbladder, compatible with biliary sludge. In addition, multiple more echogenic foci are noted some of which demonstrate trace amounts of posterior acoustic shadowing, likely to represent tiny calculi. Gallbladder is moderately distended. Gallbladder wall thickness is normal (1.7 mm). No pericholecystic fluid. Per report from the sonographer, the patient did  not exhibit a sonographic Murphy's sign on examination. Common bile duct: Diameter: 5.3 mm in the porta hepatis. Liver: No focal lesion identified. Within normal limits in parenchymal  echogenicity. Portal vein is patent on color Doppler imaging with normal direction of blood flow towards the liver. Other: None. IMPRESSION: 1. Study is positive for biliary sludge and tiny gallstones in the gallbladder. However, there are no imaging findings to indicate an acute cholecystitis at this time. Electronically Signed   By: Trudie Reed M.D.   On: 06/18/2021 05:25    Anti-infectives: Anti-infectives (From admission, onward)    Start     Dose/Rate Route Frequency Ordered Stop   06/19/21 0800  [MAR Hold]  vancomycin (VANCOCIN) IVPB 1000 mg/200 mL premix        (MAR Hold since Fri 06/19/2021 at 0908.Hold Reason: Transfer to a Procedural area)   1,000 mg 200 mL/hr over 60 Minutes Intravenous On call to O.R. 06/19/21 0708 06/20/21 0559       Assessment/Plan:  Acute cholecystitis  OR today for laparoscopic cholecystectomy.  We have discussed the surgery, risks as outlined in previous note.  His bilirubin is up slightly this morning which is likely secondary to evolving inflammation.     FEN: Carb modified, n.p.o. at midnight, IVF at 75 ml/hr ID: None currently VTE: Lovenox   Coronary artery disease s/p bypass graft -hold home aspirin.  Resume home CCB and cholesterol medication Chronic pain -Multimodal pain control GERD - Home famotidine   LOS: 0 days    Berna Bue 06/19/2021

## 2021-06-19 NOTE — Progress Notes (Signed)
Patient updated. Surgeon delayed due to OR delay.

## 2021-06-19 NOTE — Anesthesia Preprocedure Evaluation (Addendum)
Anesthesia Evaluation  Patient identified by MRN, date of birth, ID band Patient awake    Reviewed: Allergy & Precautions, NPO status , Patient's Chart, lab work & pertinent test results  Airway Mallampati: III  TM Distance: >3 FB Neck ROM: Full    Dental  (+) Missing, Chipped,    Pulmonary asthma ,    Pulmonary exam normal breath sounds clear to auscultation       Cardiovascular + CAD and + Cardiac Stents  Normal cardiovascular exam Rhythm:Regular Rate:Normal  ECG: rate 76. Sinus rhythm Right bundle branch block  Hypertrophic obstructive cardiomyopathy   Neuro/Psych  Headaches, PSYCHIATRIC DISORDERS Anxiety Depression  Neuromuscular disease    GI/Hepatic Neg liver ROS, GERD  Medicated and Controlled,  Endo/Other  negative endocrine ROS  Renal/GU Renal disease     Musculoskeletal  (+) Arthritis ,   Abdominal   Peds  Hematology negative hematology ROS (+)   Anesthesia Other Findings CHOLELITHIASIS POSSIBLE CHOLECYSTITIS  Reproductive/Obstetrics                            Anesthesia Physical Anesthesia Plan  ASA: 3  Anesthesia Plan: General   Post-op Pain Management:    Induction: Intravenous  PONV Risk Score and Plan: 3 and Ondansetron, Dexamethasone, Midazolam and Treatment may vary due to age or medical condition  Airway Management Planned: Oral ETT  Additional Equipment:   Intra-op Plan:   Post-operative Plan: Extubation in OR  Informed Consent: I have reviewed the patients History and Physical, chart, labs and discussed the procedure including the risks, benefits and alternatives for the proposed anesthesia with the patient or authorized representative who has indicated his/her understanding and acceptance.     Dental advisory given  Plan Discussed with: CRNA  Anesthesia Plan Comments:        Anesthesia Quick Evaluation

## 2021-06-19 NOTE — Anesthesia Procedure Notes (Signed)
Procedure Name: Intubation Date/Time: 06/19/2021 11:53 AM Performed by: Niel Hummer, CRNA Pre-anesthesia Checklist: Patient identified, Emergency Drugs available, Suction available and Patient being monitored Patient Re-evaluated:Patient Re-evaluated prior to induction Oxygen Delivery Method: Circle system utilized Preoxygenation: Pre-oxygenation with 100% oxygen Induction Type: IV induction Ventilation: Mask ventilation without difficulty and Oral airway inserted - appropriate to patient size Laryngoscope Size: Mac and 4 Grade View: Grade I Tube type: Oral Tube size: 7.5 mm Number of attempts: 1 Airway Equipment and Method: Stylet Placement Confirmation: ETT inserted through vocal cords under direct vision, positive ETCO2 and breath sounds checked- equal and bilateral Secured at: 23 cm Tube secured with: Tape Dental Injury: Teeth and Oropharynx as per pre-operative assessment

## 2021-06-19 NOTE — Op Note (Signed)
Operative Note  Emrah Ariola 65 y.o. male [id number]  06/19/2021  Surgeon: Berna Bue MD FACS  Procedure performed: Laparoscopic Cholecystectomy  Procedure classification: emergent  Preop diagnosis: acute cholecystitis Post-op diagnosis/intraop findings: acute gangrenous cholecystitis  Specimens: gallbladder  Retained items: none  EBL: minimal  Complications: none  Description of procedure: After obtaining informed consent the patient was brought to the operating room. Antibiotics were administered. SCD's were applied. General endotracheal anesthesia was initiated and a formal time-out was performed. The abdomen was prepped and draped in the usual sterile fashion and the abdomen was entered using  a left subcostal Veress needle  after instilling the site with local. Insufflation to was obtained, 44mm trocar right subcostal trocar and camera inserted, and gross inspection revealed no evidence of injury from our entry or other intraabdominal abnormalities. Two 36mm trocars were introduced in the supraumbilical and right anterior axillary lines under direct visualization and following infiltration with local. An 52mm trocar was placed in the epigastrium.  The gallbladder is tensely distended and the distal half is frankly necrotic.  This was aspirated with the Nezhat to allow it to be grasped.  The gallbladder was retracted cephalad and the infundibulum was retracted laterally. A combination of hook electrocautery and blunt dissection was utilized to clear the peritoneum from the neck and cystic duct, circumferentially isolating the cystic artery and cystic duct and lifting the gallbladder from the cystic plate.  There was a very small (3 mm or less) tubular structure between the artery and the cystic duct which was carefully skeletonized and appeared to be an arterial branch versus thickened peritoneum going onto the cystic duct.  This was clipped proximally and divided sharply.  The  critical view of safety was achieved with the cystic artery, cystic duct, and liver bed visualized between them with no other structures. The artery was clipped with a single clip proximally and distally and divided as was the cystic duct with three clips on the proximal end.  The gallbladder was dissected from the liver plate using electrocautery. Once freed the gallbladder was placed in an endocatch bag and removed through the epigastric trocar site. Some bile had been spilled from the gallbladder during its dissection from the liver bed. This was aspirated and the right upper quadrant was irrigated copiously until the effluent was clear. Hemostasis was once again confirmed, and reinspection of the abdomen revealed no injuries. The clips were well opposed without any bile leak from the duct or the liver bed. The 76mm trocar site in the epigastrium was closed with a 0 vicryl in the fascia under direct visualization using a PMI device. The abdomen was desufflated and all trocars removed. The skin incisions were closed with subcuticular 4-0 monocryl and Dermabond. The patient was awakened, extubated and transported to the recovery room in stable condition.    All counts were correct at the completion of the case.

## 2021-06-19 NOTE — Progress Notes (Signed)
Spoke to [employer] and received verbal report on patient for surgery.

## 2021-06-20 DIAGNOSIS — K8012 Calculus of gallbladder with acute and chronic cholecystitis without obstruction: Secondary | ICD-10-CM | POA: Diagnosis not present

## 2021-06-20 LAB — COMPREHENSIVE METABOLIC PANEL
ALT: 30 U/L (ref 0–44)
AST: 31 U/L (ref 15–41)
Albumin: 3.7 g/dL (ref 3.5–5.0)
Alkaline Phosphatase: 50 U/L (ref 38–126)
Anion gap: 7 (ref 5–15)
BUN: 11 mg/dL (ref 8–23)
CO2: 24 mmol/L (ref 22–32)
Calcium: 9.2 mg/dL (ref 8.9–10.3)
Chloride: 111 mmol/L (ref 98–111)
Creatinine, Ser: 0.67 mg/dL (ref 0.61–1.24)
GFR, Estimated: >60 mL/min (ref >60)
Glucose, Bld: 117 mg/dL — ABNORMAL HIGH (ref 70–99)
Potassium: 4 mmol/L (ref 3.5–5.1)
Sodium: 142 mmol/L (ref 135–145)
Total Bilirubin: 1.4 mg/dL — ABNORMAL HIGH (ref 0.3–1.2)
Total Protein: 6.7 g/dL (ref 6.5–8.1)

## 2021-06-20 LAB — CBC
HCT: 37.6 % — ABNORMAL LOW (ref 39.0–52.0)
Hemoglobin: 12.5 g/dL — ABNORMAL LOW (ref 13.0–17.0)
MCH: 32 pg (ref 26.0–34.0)
MCHC: 33.2 g/dL (ref 30.0–36.0)
MCV: 96.2 fL (ref 80.0–100.0)
Platelets: 155 10*3/uL (ref 150–400)
RBC: 3.91 MIL/uL — ABNORMAL LOW (ref 4.22–5.81)
RDW: 14.4 % (ref 11.5–15.5)
WBC: 12.3 10*3/uL — ABNORMAL HIGH (ref 4.0–10.5)
nRBC: 0 % (ref 0.0–0.2)

## 2021-06-20 MED ORDER — OXYCODONE-ACETAMINOPHEN 5-325 MG PO TABS: 1.0000 | ORAL_TABLET | ORAL | 0 refills | Status: AC | PRN

## 2021-06-20 NOTE — Discharge Summary (Signed)
Patient ID: Edward Curtis [id number] 65 y.o. 1956/05/08  06/18/2021  Discharge date and time: 06/20/2021  Admitting Physician: Hyman Hopes Kmari Brian  Discharge Physician: Hyman Hopes Karson Reede  Admission Diagnoses: RUQ pain [R10.11] Symptomatic cholelithiasis [K80.20] Cholecystitis, acute with cholelithiasis [K80.00] Patient Active Problem List   Diagnosis Date Noted   Cholelithiasis 06/18/2021   Cholecystitis, acute with cholelithiasis 06/18/2021   Arthropathy of left temporomandibular joint 06/04/2021   Eczema 04/20/2021   Seasonal allergies    Neuromuscular disorder (HCC)    Migraines    GERD (gastroesophageal reflux disease)    Depression    Chronic neck pain    Chicken pox    Calcium oxalate renal stones    Asthma    Arthritis    Allergy    Myalgia due to statin 05/14/2020   Coronary artery disease involving native coronary artery of native heart with angina pectoris (HCC) 03/30/2019   Angina, class III (HCC) 03/16/2019    Class: Question of   Allergy to IVP dye 03/16/2019   Chest discomfort 03/08/2019   Edema 08/31/2018   Hypogonadism in male 11/25/2017   Scabies 08/20/2017   Nasal abrasion 08/20/2017   Anxiety and depression 11/12/2013   Prediabetes 11/12/2013   Obesity (BMI 30-39.9) 10/10/2013   Hyperlipidemia 10/10/2013   Hypertrophic cardiomyopathy (HCC)    IBS (irritable bowel syndrome) 12/07/2011   HIstory of SVT    Chronic back pain    Neck injury 11/25/2000   Blood transfusion without reported diagnosis 1985     Discharge Diagnoses: gangrenous cholecystitis Patient Active Problem List   Diagnosis Date Noted   Cholelithiasis 06/18/2021   Cholecystitis, acute with cholelithiasis 06/18/2021   Arthropathy of left temporomandibular joint 06/04/2021   Eczema 04/20/2021   Seasonal allergies    Neuromuscular disorder (HCC)    Migraines    GERD (gastroesophageal reflux disease)    Depression    Chronic neck pain    Chicken pox    Calcium oxalate  renal stones    Asthma    Arthritis    Allergy    Myalgia due to statin 05/14/2020   Coronary artery disease involving native coronary artery of native heart with angina pectoris (HCC) 03/30/2019   Angina, class III (HCC) 03/16/2019    Class: Question of   Allergy to IVP dye 03/16/2019   Chest discomfort 03/08/2019   Edema 08/31/2018   Hypogonadism in male 11/25/2017   Scabies 08/20/2017   Nasal abrasion 08/20/2017   Anxiety and depression 11/12/2013   Prediabetes 11/12/2013   Obesity (BMI 30-39.9) 10/10/2013   Hyperlipidemia 10/10/2013   Hypertrophic cardiomyopathy (HCC)    IBS (irritable bowel syndrome) 12/07/2011   HIstory of SVT    Chronic back pain    Neck injury 11/25/2000   Blood transfusion without reported diagnosis 1985    Operations: Procedure(s): LAPAROSCOPIC CHOLECYSTECTOMY WITH POSSIBLE INTRAOPERATIVE CHOLANGIOGRAM  Admission Condition: good  Discharged Condition: good  Indication for Admission: Cholecystitis  Hospital Course: Mr. Dolinger presented with abdominal pain, was found to have cholecystitis.  He was taken for laparoscopic cholecystectomy on 06/19/21, and the gallbladder appeared gangrenous.  He recovered well from surgery and was discharged on 06/20/21.  Consults: None  Significant Diagnostic Studies: RUQ [place] 06/18/21  Treatments: surgery: as above  Disposition: Home  Patient Instructions:  Allergies as of 06/20/2021       Reactions   Ciprofloxacin Other (See Comments)   Possible "leg swelling, pain, unable to walk"   Iodinated Diagnostic Agents Anaphylaxis, Hives, Itching, Swelling  Patient must be premedicated with 13 hour protocol prior to IV contrast administration   Penicillins Hives   Did it involve swelling of the face/tongue/throat, SOB, or low BP? No Did it involve sudden or severe rash/hives, skin peeling, or any reaction on the inside of your mouth or nose? Yes Did you need to seek medical attention at a hospital or doctor's office?  Yes When did it last happen?      25 + years ago If all above answers are "NO", may proceed with cephalosporin use.   Yellow Jacket Venom Anaphylaxis   Swelling    Atorvastatin    myaglias   Clindamycin/lincomycin Diarrhea, Nausea Only   Erythromycin Diarrhea, Nausea Only   Ibuprofen Other (See Comments)   Causes tachycardia   Keflex [cephalexin]    Rash hives   Other Itching, Other (See Comments)    Walnut trees and walnuts , sores in mouth   Rosuvastatin    myaligias   Sulfa Antibiotics Hives   Latex Rash   Developed rash 06/2017 that progressed.  Allergy testing positive for latex allergy.  Later determined rash was 2/2 latex in [employer] foods.        Medication List     STOP taking these medications    oxyCODONE-acetaminophen 10-325 MG tablet Commonly known as: Percocet Replaced by: oxyCODONE-acetaminophen 5-325 MG tablet       TAKE these medications    aspirin EC 81 MG tablet Take 81 mg by mouth daily.   diphenhydrAMINE 25 MG tablet Commonly known as: BENADRYL Take 25 mg by mouth daily as needed (allergic reactions).   EPINEPHrine 0.3 mg/0.3 mL Soaj injection Commonly known as: EpiPen 2-Pak Inject 0.3 mLs (0.3 mg total) into the muscle once as needed (for severe allergic reaction). CAll 911 immediately if you have to use this medicine   ezetimibe 10 MG tablet Commonly known as: ZETIA TAKE ONE TABLET EACH DAY   famotidine 20 MG tablet Commonly known as: PEPCID Take 1 tablet (20 mg total) by mouth 2 (two) times daily.   ketoconazole 2 % cream Commonly known as: NIZORAL Apply topically daily.   Melatonin 1 MG Chew Chew 1 mg by mouth daily.   methocarbamol 500 MG tablet Commonly known as: Robaxin Take 1 tablet (500 mg total) by mouth every 6 (six) hours as needed for muscle spasms.   nitroGLYCERIN 0.4 MG SL tablet Commonly known as: NITROSTAT ONE TABLET UNDER TONGUE EVERY 5 MINUTES AS NEEDED FOR CHEST PAIN   ondansetron 8 MG disintegrating  tablet Commonly known as: Zofran ODT Take 1 tablet (8 mg total) by mouth every 8 (eight) hours as needed for nausea or vomiting.   oxyCODONE-acetaminophen 5-325 MG tablet Commonly known as: Percocet Take 1 tablet by mouth every 4 (four) hours as needed for severe pain. Replaces: oxyCODONE-acetaminophen 10-325 MG tablet   Repatha SureClick 140 MG/ML Soaj Generic drug: Evolocumab INJECT 140MG  INTO THE SKIN EVERY 14 DAYS   tadalafil 5 MG tablet Commonly known as: Cialis Take 1 tablet (5 mg total) by mouth daily as needed for erectile dysfunction.   valACYclovir 1000 MG tablet Commonly known as: VALTREX Take 1,000 mg by mouth daily as needed.   verapamil 180 MG CR tablet Commonly known as: CALAN-SR TAKE ONE TABLET DAILY   verapamil 80 MG tablet Commonly known as: CALAN TAKE ONE TABLET DAILY AS NEEDED   Vitamin B 12 500 MCG Tabs Take 500 mcg by mouth daily.        Activity: No  heavy lifting or strenuous activity for 4 weeks Diet: regular diet Wound Care: keep wound clean and dry  Follow-up:  With the surgery office in 4 weeks.  Signed: Hyman Hopes Hartlee [facility], [place]   06/20/2021, 8:52 AM

## 2021-06-20 NOTE — Plan of Care (Signed)
Instructions were reviewed with patient. All questions were answered. Patient was transported to main entrance by wheelchair. ° °

## 2021-06-20 NOTE — Discharge Instructions (Signed)
 CHOLECYSTECTOMY POST OPERATIVE INSTRUCTIONS  Thinking Clearly  The anesthesia may cause you to feel different for 1 or 2 days. Do not drive, drink alcohol, or make any big decisions for at least 2 days.  Nutrition When you wake up, you will be able to drink small amounts of liquid. If you do not feel sick, you can slowly advance your diet to regular foods. Continue to drink lots of fluids, usually about 8 to 10 glasses per day. Eat a high-fiber diet so you don't strain during bowel movements. High-Fiber Foods Foods high in fiber include beans, bran cereals and whole-grain breads, peas, dried fruit (figs, apricots, and dates), raspberries, blackberries, strawberries, sweet corn, broccoli, baked potatoes with skin, plums, pears, apples, greens, and nuts. Activity Slowly increase your activity. Be sure to get up and walk every hour or so to prevent blood clots. No heavy lifting or strenuous activity for 4 weeks following surgery to prevent hernias at your incision sites It is normal to feel tired. You may need more sleep than usual.  Get your rest but make sure to get up and move around frequently to prevent blood clots and pneumonia.  Work and Return to School You can go back to work when you feel well enough. Discuss the timing with your surgeon. You can usually go back to school or work 1 week after an operation. If your work requires heavy lifting or strenuous activity you need to be placed on light duty for 4 weeks following surgery. You can return to gym class, sports or other physical activities 4 weeks after surgery.  Wound Care Always wash your hands before and after touching near your incision site. Do not soak in a bathtub until cleared at your follow up appointment. You may take a shower 24 hours after surgery. A small amount of drainage from the incision is normal. If the drainage is thick and yellow or the site is red, you may have an infection, so call your surgeon. If you  have a drain in one of your incisions, it will be taken out in office when the drainage stops. Steri-Strips will fall off in 7 to 10 days or they will be removed during your first office visit. If you have dermabond glue covering over the incision, allow the glue to flake off on its own. Avoid wearing tight or rough clothing. It may rub your incisions and make it harder for them to heal. Protect the new skin, especially from the sun. The sun can burn and cause darker scarring. Your scar will heal in about 4 to 6 weeks and will become softer and continue to fade over the next year.  The cosmetic appearance of the incisions will improve over the course of the first year after surgery. Sensation around your incision will return in a few weeks or months.  Bowel Movements After intestinal surgery, you may have loose watery stools for several days. If watery diarrhea lasts longer than 3 days, contact your surgeon. Pain medication (narcotics) can cause constipation. Increase the fiber in your diet with high-fiber foods if you are constipated. You can take an over the counter stool softener like Colace to avoid constipation.  Additional over the counter medications can also be used if Colace isn't sufficient (for example, Milk of Magnesia or Miralax).  Pain The amount of pain is different for each person. Some people need only 1 to 3 doses of pain control medication, while others need more. Take alternating doses of tylenol   and ibuprofen around the clock for the first five days following surgery.  This will provide a baseline of pain control and help with inflammation.  Take the narcotic pain medication in addition if needed for severe pain.  Contact Your Surgeon at [phone], if you have: Pain in your right upper abdomen like a gallbladder attack. Pain that will not go away Pain that gets worse A fever of more than 101F (38.3C) Repeated vomiting Swelling, redness, bleeding, or bad-smelling  drainage from your wound site Strong abdominal pain No bowel movement or unable to pass gas for 3 days Watery diarrhea lasting longer than 3 days  Pain Control The goal of pain control is to minimize pain, keep you moving and help you heal. Your surgical team will work with you on your pain plan. Most often a combination of therapies and medications are used to control your pain. You may also be given medication (local anesthetic) at the surgical site. This may help control your pain for several days. Extreme pain puts extra stress on your body at a time when your body needs to focus on healing. Do not wait until your pain has reached a level "10" or is unbearable before telling your doctor or nurse. It is much easier to control pain before it becomes severe. Following a laparoscopic procedure, pain is sometimes felt in the shoulder. This is due to the gas inserted into your abdomen during the procedure. Moving and walking helps to decrease the gas and the right shoulder pain.  Use the guide below for ways to manage your post-operative pain. Learn more by going to facs.org/safepaincontrol.  How Intense Is My Pain Common Therapies to Feel Better       I hardly notice my pain, and it does not interfere with my activities.  I notice my pain and it distracts me, but I can still do activities (sitting up, walking, standing).  Non-Medication Therapies  Ice (in a bag, applied over clothing at the surgical site), elevation, rest, meditation, massage, distraction (music, TV, play) walking and mild exercise Splinting the abdomen with pillows +  Non-Opioid Medications Acetaminophen (Tylenol) Non-steroidal anti-inflammatory drugs (NSAIDS) Aspirin, Ibuprofen (Motrin, Advil) Naproxen (Aleve) Take these as needed, when you feel pain. Both acetaminophen and NSAIDs help to decrease pain and swelling (inflammation).      My pain is hard to ignore and is more noticeable even when I rest.  My  pain interferes with my usual activities.  Non-Medication Therapies  +  Non-Opioid medications  Take on a regular schedule (around-the-clock) instead of as needed. (For example, Tylenol every 6 hours at 9:00 am, 3:00 pm, 9:00 pm, 3:00 am and Motrin every 6 hours at 12:00 am, 6:00 am, 12:00 pm, 6:00 pm)         I am focused on my pain, and I am not doing my daily activities.  I am groaning in pain, and I cannot sleep. I am unable to do anything.  My pain is as bad as it could be, and nothing else matters.  Non-Medication Therapies  +  Around-the-Clock Non-Opioid Medications  +  Short-acting opioids  Opioids should be used with other medications to manage severe pain. Opioids block pain and give a feeling of euphoria (feel high). Addiction, a serious side effect of opioids, is rare with short-term (a few days) use.  Examples of short-acting opioids include: Tramadol (Ultram), Hydrocodone (Norco, Vicodin), Hydromorphone (Dilaudid), Oxycodone (Oxycontin)     The above directions have been adapted from   the American College [employer] Surgical Patient Education Program.  Please refer to the ACS website if needed: [email or web address]://[email or web address].org/-/media/[email or web address].   Edward Salonga, MD [facility] [address], [address], [place], [place]  [address] ?  [address] [address], [place], [place]   [address] [phone] ? [phone] ? FAX [phone] Web site: [email or web address]  

## 2021-06-22 ENCOUNTER — Encounter (HOSPITAL_COMMUNITY): Payer: Self-pay | Admitting: Surgery

## 2021-06-22 LAB — SURGICAL PATHOLOGY

## 2021-06-22 NOTE — Anesthesia Postprocedure Evaluation (Signed)
Anesthesia Post Note  Patient: Edward Curtis  Procedure(s) Performed: LAPAROSCOPIC CHOLECYSTECTOMY WITH POSSIBLE INTRAOPERATIVE CHOLANGIOGRAM (Abdomen)     Patient location during evaluation: PACU Anesthesia Type: General Level of consciousness: awake Pain management: pain level controlled Vital Signs Assessment: post-procedure vital signs reviewed and stable Respiratory status: spontaneous breathing, nonlabored ventilation, respiratory function stable and patient connected to nasal cannula oxygen Cardiovascular status: blood pressure returned to baseline and stable Postop Assessment: no apparent nausea or vomiting Anesthetic complications: no   No notable events documented.  Last Vitals:  Vitals:   06/20/21 0519 06/20/21 0923  BP: 102/60 125/71  Pulse: 67 74  Resp: 16 20  Temp: 36.8 C 36.7 C  SpO2: 95% 98%    Last Pain:  Vitals:   06/20/21 0923  TempSrc: Oral  PainSc:                  Catheryn Bacon Braelynn Benning

## 2021-07-10 LAB — LIPID PANEL
Chol/HDL Ratio: 3.2 ratio (ref 0.0–5.0)
Cholesterol, Total: 109 mg/dL (ref 100–199)
HDL: 34 mg/dL — ABNORMAL LOW (ref >39)
LDL Chol Calc (NIH): 48 mg/dL (ref 0–99)
Triglycerides: 155 mg/dL — ABNORMAL HIGH (ref 0–149)
VLDL Cholesterol Cal: 27 mg/dL (ref 5–40)

## 2021-07-21 ENCOUNTER — Ambulatory Visit (INDEPENDENT_AMBULATORY_CARE_PROVIDER_SITE_OTHER): Payer: Medicare Other | Admitting: Internal Medicine

## 2021-07-21 ENCOUNTER — Telehealth: Payer: Self-pay | Admitting: Internal Medicine

## 2021-07-21 ENCOUNTER — Encounter (HOSPITAL_BASED_OUTPATIENT_CLINIC_OR_DEPARTMENT_OTHER): Payer: Self-pay | Admitting: Internal Medicine

## 2021-07-21 ENCOUNTER — Other Ambulatory Visit: Payer: Self-pay

## 2021-07-21 VITALS — BP 105/73 | HR 82 | Ht 68.0 in | Wt 197.1 lb

## 2021-07-21 DIAGNOSIS — M791 Myalgia, unspecified site: Secondary | ICD-10-CM

## 2021-07-21 DIAGNOSIS — I25119 Atherosclerotic heart disease of native coronary artery with unspecified angina pectoris: Secondary | ICD-10-CM

## 2021-07-21 DIAGNOSIS — T466X5A Adverse effect of antihyperlipidemic and antiarteriosclerotic drugs, initial encounter: Secondary | ICD-10-CM

## 2021-07-21 DIAGNOSIS — E782 Mixed hyperlipidemia: Secondary | ICD-10-CM

## 2021-07-21 DIAGNOSIS — T466X5D Adverse effect of antihyperlipidemic and antiarteriosclerotic drugs, subsequent encounter: Secondary | ICD-10-CM | POA: Diagnosis not present

## 2021-07-21 NOTE — Telephone Encounter (Signed)
Patient sees Dr. Rennis Golden for lipid clinic. He would like to change his general cardiology care to Dr. Rennis Golden, from Dr. Dulce Sellar.   Message routed to provider(s) to advise if this request can be approved

## 2021-07-21 NOTE — Progress Notes (Signed)
LIPID CLINIC CONSULT NOTE  Chief Complaint:  Follow-up dyslipidemia  Primary Care Physician: [name], MD  Primary Cardiologist:  Norman Herrlich, MD  HPI:  Edward Curtis is a 65 y.o. male who is being seen today for the evaluation of dyslipidemia at the request of [name], MD. This is a 65 year old male kindly referred by Dr. Dulce Sellar for evaluation and management of dyslipidemia.  He previously seen Laural Golden, PharmD in our lipid clinic and was started on Repatha.  This was following several medication changes including a number of statins both high and low potency that he did not tolerate as well as adding ezetimibe.  He said he had only been on ezetimibe for a couple of weeks and then started on Repatha.  He has a history of low back pain and also has a history of coronary disease with a stent in 2020.  He was previously followed by Dr. Donnie Aho.  After giving himself the first injection of Repatha he reported low back pain which worsened over the course of about a week and eventually improved.  He felt that this could be related since he looked up side effects of the medicine which included back pain and then did not pursue any more doses of the medication.  Prior to starting the medicine in July his total cholesterol 217, triglycerides 220 HDL 30 and LDL of 147 with a target LDL of less than 70 given his coronary disease history.  12/31/2020  Edward Curtis returns today for follow-up.  He initially thought that he had side effects with the Repatha causing back pain.  It might be because he said he continued to have some back pain issues however it seemed that it seem to be getting better and better after every dose.  The last dose he noted no side effects from it.  His cholesterol has responded significantly.  His total is now 95, triglycerides 170, HDL 35 and LDL 32 (decreased from 147).  He remains on ezetimibe.  He is concerned because he is recently gained about 50 pounds.  He is now  scheduled to start a commercial weight loss program with weight watchers.  He is hoping to get that weight down in about 6 months.  07/21/2021  Edward Curtis returns today for follow-up of dyslipidemia.  Overall he seems to be doing well on his current therapies.  His cholesterol is gone up slightly with LDL now at 48 up from 32.  Triglycerides are lower at 155 and total cholesterol is risen slightly to 109.  Overall he is tolerating the medicines well.  He has no complaints.  He denies any chest pain or worsening shortness of breath.  He had seen Dr. Dulce Sellar earlier in the year who indicated that he may be retiring soon and that [name] may wish to speak with me about switching providers to me.  PMHx:  Past Medical History:  Diagnosis Date   Allergy    seasonal allergies    Arthritis    Asthma    Blood transfusion without reported diagnosis 10/26/1983   Calcium oxalate renal stones    early 1990's   Cataract    Chicken pox    Chronic neck pain    Depression    GERD (gastroesophageal reflux disease)    Hyperlipidemia 10/10/2013   Hypertrophic obstructive cardiomyopathy(425.11) 10/10/2013   IBS (irritable bowel syndrome) 12/07/2011   Migraines    Neuromuscular disorder (HCC)    radiculopathy left side  Obesity (BMI 30-39.9) 10/10/2013   Seasonal allergies     Past Surgical History:  Procedure Laterality Date   CATARACT EXTRACTION Left 10/2020   CHOLECYSTECTOMY N/A 06/19/2021   Procedure: LAPAROSCOPIC CHOLECYSTECTOMY WITH POSSIBLE INTRAOPERATIVE CHOLANGIOGRAM;  Surgeon: Berna Bue, MD;  Location: [facility];  Service: General;  Laterality: N/A;   CORONARY STENT INTERVENTION N/A 03/23/2019   Procedure: CORONARY STENT INTERVENTION;  Surgeon: Marykay Lex, MD;  Location: [facility] INVASIVE CV LAB;  Service: Cardiovascular;  Laterality: N/A;   LEFT HEART CATH AND CORONARY ANGIOGRAPHY N/A 03/23/2019   Procedure: LEFT HEART CATH AND CORONARY ANGIOGRAPHY;  Surgeon: Marykay Lex, MD;   Location: [facility] INVASIVE CV LAB;  Service: Cardiovascular;  Laterality: N/A;   left knee surgery  1990   MOUTH SURGERY  1975/1993   pre cancerous mole  2013   prostate procedure  1980   umbilical cyst  1985    FAMHx:  Family History  Problem Relation Age of Onset   Arthritis Mother    Hyperlipidemia Mother    Heart disease Mother    Hypertension Mother    Heart disease Father    Hypertension Father    Diabetes Father    Arthritis Paternal Grandmother    Diabetes Paternal Grandmother    Colon cancer Paternal Grandfather    Heart disease Paternal Grandfather    Diabetes Paternal Grandfather    Hyperlipidemia Maternal Grandmother    Hypertension Other        both sets of parents   Esophageal cancer Neg Hx    Rectal cancer Neg Hx    Stomach cancer Neg Hx     SOCHx:   reports that he has never smoked. He has never used smokeless tobacco. He reports current alcohol use of about 1.0 standard drink per week. He reports that he does not use drugs.  ALLERGIES:  Allergies  Allergen Reactions   Ciprofloxacin Other (See Comments)    Possible "leg swelling, pain, unable to walk"   Iodinated Diagnostic Agents Anaphylaxis, Hives, Itching and Swelling    Patient must be premedicated with 13 hour protocol prior to IV contrast administration   Penicillins Hives    Did it involve swelling of the face/tongue/throat, SOB, or low BP? No Did it involve sudden or severe rash/hives, skin peeling, or any reaction on the inside of your mouth or nose? Yes Did you need to seek medical attention at a hospital or doctor's office? Yes When did it last happen?      25 + years ago If all above answers are "NO", may proceed with cephalosporin use.    Yellow Jacket Venom Anaphylaxis    Swelling    Atorvastatin     myaglias   Clindamycin/Lincomycin Diarrhea and Nausea Only   Erythromycin Diarrhea and Nausea Only   Ibuprofen Other (See Comments)    Causes tachycardia   Keflex [Cephalexin]     Rash  hives   Other Itching and Other (See Comments)     Walnut trees and walnuts , sores in mouth   Rosuvastatin     myaligias   Sulfa Antibiotics Hives   Latex Rash    Developed rash 06/2017 that progressed.  Allergy testing positive for latex allergy.  Later determined rash was 2/2 latex in [employer] foods.    ROS: Pertinent items noted in HPI and remainder of comprehensive ROS otherwise negative.  HOME MEDS: Current Outpatient Medications on File Prior to Visit  Medication Sig Dispense Refill   aspirin EC  81 MG tablet Take 81 mg by mouth daily.     Cyanocobalamin (VITAMIN B 12) 500 MCG TABS Take 500 mcg by mouth daily.     diphenhydrAMINE (BENADRYL) 25 MG tablet Take 25 mg by mouth daily as needed (allergic reactions).     EPINEPHrine (EPIPEN 2-PAK) 0.3 mg/0.3 mL IJ SOAJ injection Inject 0.3 mLs (0.3 mg total) into the muscle once as needed (for severe allergic reaction). CAll 911 immediately if you have to use this medicine 1 each 1   Evolocumab (REPATHA SURECLICK) 140 MG/ML SOAJ INJECT 140MG  INTO THE SKIN EVERY 14 DAYS 2 mL 11   ezetimibe (ZETIA) 10 MG tablet TAKE ONE TABLET EACH DAY 90 tablet 3   famotidine (PEPCID) 20 MG tablet Take 1 tablet (20 mg total) by mouth 2 (two) times daily. 30 tablet 0   ketoconazole (NIZORAL) 2 % cream Apply topically daily.     Melatonin 1 MG CHEW Chew 1 mg by mouth daily.     methocarbamol (ROBAXIN) 500 MG tablet Take 1 tablet (500 mg total) by mouth every 6 (six) hours as needed for muscle spasms. 60 tablet 3   nitroGLYCERIN (NITROSTAT) 0.4 MG SL tablet ONE TABLET UNDER TONGUE EVERY 5 MINUTES AS NEEDED FOR CHEST PAIN 25 tablet 3   ondansetron (ZOFRAN ODT) 8 MG disintegrating tablet Take 1 tablet (8 mg total) by mouth every 8 (eight) hours as needed for nausea or vomiting. 10 tablet 0   oxyCODONE-acetaminophen (PERCOCET) 5-325 MG tablet Take 1 tablet by mouth every 4 (four) hours as needed for severe pain. 10 tablet 0   Polyethylene Glycol 3350 (MIRALAX  PO) Take by mouth.     tadalafil (CIALIS) 5 MG tablet Take 1 tablet (5 mg total) by mouth daily as needed for erectile dysfunction. 10 tablet 1   valACYclovir (VALTREX) 1000 MG tablet Take 1,000 mg by mouth daily as needed.     verapamil (CALAN) 80 MG tablet TAKE ONE TABLET DAILY AS NEEDED 90 tablet 2   verapamil (CALAN-SR) 180 MG CR tablet TAKE ONE TABLET DAILY 90 tablet 3   No current facility-administered medications on file prior to visit.    LABS/IMAGING: No results found for this or any previous visit (from the past 48 hour(s)). No results found.  LIPID PANEL:    Component Value Date/Time   CHOL 109 07/10/2021 0824   TRIG 155 (H) 07/10/2021 0824   HDL 34 (L) 07/10/2021 0824   CHOLHDL 3.2 07/10/2021 0824   CHOLHDL 3 06/04/2021 0849   VLDL 32.6 06/04/2021 0849   LDLCALC 48 07/10/2021 0824   LDLDIRECT 151.2 09/04/2014 0926    WEIGHTS: Wt Readings from Last 3 Encounters:  07/21/21 197 lb 1.6 oz (89.4 kg)  06/19/21 190 lb 0.6 oz (86.2 kg)  06/18/21 190 lb (86.2 kg)    VITALS: BP 105/73   Pulse 82   Ht 5\' 8"  (1.727 m)   Wt 197 lb 1.6 oz (89.4 kg)   SpO2 99%   BMI 29.97 kg/m   EXAM: Deferred  EKG: Deferred  ASSESSMENT: CAD status post PCI in 2020 Mixed dyslipidemia, goal LDL less than 70 Statin intolerance-myalgias Chronic low back pain Hypertension  PLAN: 1.   EdwardFebo continues to do well at target LDL although his cholesterol is slightly higher it is still very well controlled.  He is interested in switching providers from Dr. to myself.  Fortunately he seems to live only a few miles from the [facility], which  would be a convenient place for him to follow-up.  We will reach out to Dr. Dulce Sellar to see if he supports the switching providers.  Plan follow-up with me annually or sooner as necessary.  Chrystie Nose, MD, [facility], FACP  [facility]  Medical Director of the Advanced Lipid Disorders &   Cardiovascular Risk Reduction Clinic Diplomate of the American Board of Clinical Lipidology Attending Cardiologist  Direct Dial: [phone]  Fax: [phone]  Website:  [email or web address] Nicely Mariell Nester 07/21/2021, 9:26 AM

## 2021-07-21 NOTE — Patient Instructions (Signed)
Medication Instructions:  Your physician recommends that you continue on your current medications as directed. Please refer to the Current Medication list given to you today.  *If you need a refill on your cardiac medications before your next appointment, please call your pharmacy*   Lab Work: FASTING lipid in 1 year -- complete about 1 week before your next visit with Dr. Rennis Golden   If you have labs (blood work) drawn today and your tests are completely normal, you will receive your results only by: MyChart Message (if you have MyChart) OR A paper copy in the mail If you have any lab test that is abnormal or we need to change your treatment, we will call you to review the results.   Testing/Procedures: NONE   Follow-Up: At [facility], you and your health needs are our priority.  As part of our continuing mission to provide you with exceptional heart care, we have created designated Provider Care Teams.  These Care Teams include your primary Cardiologist (physician) and Advanced Practice Providers (APPs -  Physician Assistants and Nurse Practitioners) who all work together to provide you with the care you need, when you need it.  We recommend signing up for the patient portal called "MyChart".  Sign up information is provided on this After Visit Summary.  MyChart is used to connect with patients for Virtual Visits (Telemedicine).  Patients are able to view lab/test results, encounter notes, upcoming appointments, etc.  Non-urgent messages can be sent to your provider as well.   To learn more about what you can do with MyChart, go to [email or web address].    Your next appointment:   12 month(s)  The format for your next appointment:   In Person  Provider:   K. Italy Hilty, MD   Other Instructions

## 2021-07-21 NOTE — Telephone Encounter (Signed)
Baldo Daub, MD  You; Rennis Golden Lisette Abu, MD 26 minutes ago (11:15 AM)   Molli Knock with me good plan      Hilty, Lisette Abu, MD  You; Dulce Sellar Iline Oven, MD 54 minutes ago (10:48 AM)   Rip Harbour with me - he previously discussed this with Dr. Dulce Sellar

## 2021-07-28 ENCOUNTER — Ambulatory Visit (INDEPENDENT_AMBULATORY_CARE_PROVIDER_SITE_OTHER): Payer: Medicare Other | Admitting: Family Medicine

## 2021-07-28 ENCOUNTER — Encounter: Payer: Self-pay | Admitting: Family Medicine

## 2021-07-28 ENCOUNTER — Other Ambulatory Visit: Payer: Self-pay

## 2021-07-28 VITALS — BP 110/70 | HR 92 | Temp 98.4°F | Wt 195.0 lb

## 2021-07-28 DIAGNOSIS — M79605 Pain in left leg: Secondary | ICD-10-CM | POA: Diagnosis not present

## 2021-07-28 DIAGNOSIS — I25119 Atherosclerotic heart disease of native coronary artery with unspecified angina pectoris: Secondary | ICD-10-CM

## 2021-07-28 NOTE — Progress Notes (Signed)
   Subjective:    Patient ID: Edward Curtis, male    DOB: July 01, 1956, 65 y.o.   MRN: [id number]  HPI Here to check his left lower leg. Yesterday morning he woke up with swelling and pain in the left shin area. No known trauma recently, but he says he has a habit of walking in his sleep and he often bumps into things. He saw his chiropractor who was worried about a possible blood clot, and he advised Edward Curtis to see [place]. Last night he took some Tylenol and went to sleep. When he woke up this morning, the pain and swelling were gone. He wanted to see [place] anyway.    Review of Systems  Constitutional: Negative.   Respiratory: Negative.    Cardiovascular:  Positive for leg swelling. Negative for chest pain and palpitations.      Objective:   Physical Exam Constitutional:      General: He is not in acute distress.    Appearance: Normal appearance.  Cardiovascular:     Rate and Rhythm: Normal rate and regular rhythm.     Pulses: Normal pulses.     Heart sounds: Normal heart sounds.  Pulmonary:     Effort: Pulmonary effort is normal.     Breath sounds: Normal breath sounds.  Musculoskeletal:     Comments: Left lower leg has no swelling or bruising. He is mildly tender in the middle part of the shin. No calf tenderness   Neurological:     Mental Status: He is alert.          Assessment & Plan:  Transient pain and swelling of the left shin. This is most likely the result of trauma, and it is quickly resolving. He will let us know if this happens again.  Gershon Crane, MD

## 2021-07-29 ENCOUNTER — Encounter: Payer: Self-pay | Admitting: Family Medicine

## 2021-08-21 ENCOUNTER — Telehealth: Payer: Self-pay | Admitting: Family Medicine

## 2021-08-21 NOTE — Progress Notes (Signed)
  Chronic Care Management   Outreach Note  08/21/2021 Name: Edward Curtis MRN: [id number] DOB: 03/16/1956  Referred by: [name], MD Reason for referral : No chief complaint on file.   An unsuccessful telephone outreach was attempted today. The patient was referred to the pharmacist for assistance with care management and care coordination.   Follow Up Plan:   Tatjana Dellinger Upstream Scheduler

## 2021-08-27 ENCOUNTER — Telehealth: Payer: Self-pay | Admitting: Family Medicine

## 2021-08-27 NOTE — Progress Notes (Signed)
  Chronic Care Management   Outreach Note  08/27/2021 Name: Oluwademilade Mckiver MRN: [id number] DOB: Jun 08, 1956  Referred by: [name], MD Reason for referral : No chief complaint on file.   A second unsuccessful telephone outreach was attempted today. The patient was referred to pharmacist for assistance with care management and care coordination.  Follow Up Plan:   Tatjana Dellinger Upstream Scheduler

## 2021-09-02 ENCOUNTER — Telehealth: Payer: Self-pay | Admitting: Family Medicine

## 2021-09-02 NOTE — Progress Notes (Signed)
  Chronic Care Management   Outreach Note  09/02/2021 Name: Edward Curtis MRN: [id number] DOB: 24-Aug-1956  Referred by: [name], MD Reason for referral : No chief complaint on file.   Third unsuccessful telephone outreach was attempted today. The patient was referred to the pharmacist for assistance with care management and care coordination.   Follow Up Plan:   Tatjana Dellinger Upstream Scheduler

## 2021-10-09 ENCOUNTER — Encounter: Payer: Self-pay | Admitting: Internal Medicine

## 2021-10-09 ENCOUNTER — Ambulatory Visit (INDEPENDENT_AMBULATORY_CARE_PROVIDER_SITE_OTHER): Payer: Medicare Other | Admitting: Internal Medicine

## 2021-10-09 ENCOUNTER — Telehealth: Payer: Self-pay

## 2021-10-09 VITALS — BP 110/78 | HR 100 | Temp 98.6°F | Ht 68.0 in | Wt 204.4 lb

## 2021-10-09 DIAGNOSIS — I25119 Atherosclerotic heart disease of native coronary artery with unspecified angina pectoris: Secondary | ICD-10-CM

## 2021-10-09 DIAGNOSIS — R6889 Other general symptoms and signs: Secondary | ICD-10-CM | POA: Diagnosis not present

## 2021-10-09 DIAGNOSIS — J069 Acute upper respiratory infection, unspecified: Secondary | ICD-10-CM

## 2021-10-09 LAB — POC COVID19 BINAXNOW: SARS Coronavirus 2 Ag: NEGATIVE

## 2021-10-09 NOTE — Telephone Encounter (Signed)
Patient calling in with respiratory symptoms: Shortness of breath, chest pain, palpitations or other red words send to Triage  Does the patient have a fever over 100, cough, congestion, sore throat, runny nose, lost of taste/smell (please list symptoms that patient has)?yes  cough  What date did symptoms start?12/13 (If over 5 days ago, pt may be scheduled for in person visit)  Have you tested for Covid in the last 5 days? No   If yes, was it positive []  OR negative [] ? If positive in the last 5 days, please schedule virtual visit now. If negative, schedule for an in person OV with the next available provider if PCP has no openings. Please also let patient know they will be tested again (follow the script below)  "you will have to arrive prior to your appt time to be Covid tested. Please park in back of office at the cone & call [phone] to let the staff know you have arrived. A staff member will meet you at your car to do a rapid covid test. Once the test has resulted you will be notified by phone of your results to determine if appt will remain an in person visit or be converted to a virtual/phone visit. If you arrive less than before your appt time, your visit will be automatically converted to virtual & any recommended testing will happen AFTER the visit."   THINGS TO REMEMBER  If no availability for virtual visit in office,  please schedule another [place] office  If no availability at another [place] office, please instruct patient that they can schedule an evisit or virtual visit through their mychart account. Visits up to 8pm  patients can be seen in office 5 days after positive COVID test  Patient stated he thinks he has a cold and wants to been seen because he doesn't want co-worker to get sick

## 2021-10-09 NOTE — Telephone Encounter (Signed)
Noted  

## 2021-10-09 NOTE — Progress Notes (Signed)
Acute office Visit     This visit occurred during the SARS-CoV-2 public health emergency.  Safety protocols were in place, including screening questions prior to the visit, additional usage of staff PPE, and extensive cleaning of exam room while observing appropriate contact time as indicated for disinfecting solutions.    CC/Reason for Visit: Flulike symptoms  HPI: Edward Curtis is a 65 y.o. male who is coming in today for the above mentioned reasons.  For the past 2 days he has been experiencing a sore throat, drainage, cough, mild headache.  He wanted to come in today for evaluation.  No fevers or shortness of breath.  Past Medical/Surgical History: Past Medical History:  Diagnosis Date   Allergy    seasonal allergies    Arthritis    Asthma    Blood transfusion without reported diagnosis 10/26/1983   Calcium oxalate renal stones    early 1990's   Cataract    Chicken pox    Chronic neck pain    Depression    GERD (gastroesophageal reflux disease)    Hyperlipidemia 10/10/2013   Hypertrophic obstructive cardiomyopathy(425.11) 10/10/2013   IBS (irritable bowel syndrome) 12/07/2011   Migraines    Neuromuscular disorder (HCC)    radiculopathy left side    Obesity (BMI 30-39.9) 10/10/2013   Seasonal allergies     Past Surgical History:  Procedure Laterality Date   CATARACT EXTRACTION Left 10/2020   CHOLECYSTECTOMY N/A 06/19/2021   Procedure: LAPAROSCOPIC CHOLECYSTECTOMY WITH POSSIBLE INTRAOPERATIVE CHOLANGIOGRAM;  Surgeon: Clovis Riley, MD;  Location: [facility];  Service: General;  Laterality: N/A;   CORONARY STENT INTERVENTION N/A 03/23/2019   Procedure: CORONARY STENT INTERVENTION;  Surgeon: Leonie Man, MD;  Location: [facility];  Service: Cardiovascular;  Laterality: N/A;   LEFT HEART CATH AND CORONARY ANGIOGRAPHY N/A 03/23/2019   Procedure: LEFT HEART CATH AND CORONARY ANGIOGRAPHY;  Surgeon: Leonie Man, MD;  Location: [facility];  Service:  Cardiovascular;  Laterality: N/A;   left knee surgery  1990   MOUTH SURGERY  1975/1993   pre cancerous mole  2013   prostate procedure  [id number]   umbilical cyst  [id number]    Social History:  reports that he has never smoked. He has never used smokeless tobacco. He reports current alcohol use of about 1.0 standard drink per week. He reports that he does not use drugs.  Allergies: Allergies  Allergen Reactions   Ciprofloxacin Other (See Comments)    Possible "leg swelling, pain, unable to walk"   Iodinated Diagnostic Agents Anaphylaxis, Hives, Itching and Swelling    Patient must be premedicated with 13 hour protocol prior to IV contrast administration   Penicillins Hives    Did it involve swelling of the face/tongue/throat, SOB, or low BP? No Did it involve sudden or severe rash/hives, skin peeling, or any reaction on the inside of your mouth or nose? Yes Did you need to seek medical attention at a hospital or doctor's office? Yes When did it last happen?      25 + years ago If all above answers are NO, may proceed with cephalosporin use.    Yellow Jacket Venom Anaphylaxis    Swelling    Atorvastatin     myaglias   Clindamycin/Lincomycin Diarrhea and Nausea Only   Erythromycin Diarrhea and Nausea Only   Ibuprofen Other (See Comments)    Causes tachycardia   Keflex [Cephalexin]     Rash hives   Other Itching and  Other (See Comments)     [place] trees and walnuts , sores in mouth   Rosuvastatin     myaligias   Sulfa Antibiotics Hives   Latex Rash    Developed rash 06/2017 that progressed.  Allergy testing positive for latex allergy.  Later determined rash was 2/2 latex in [employer] foods.    Family History:  Family History  Problem Relation Age of Onset   Arthritis Mother    Hyperlipidemia Mother    Heart disease Mother    Hypertension Mother    Heart disease Father    Hypertension Father    Diabetes Father    Arthritis Paternal Grandmother    Diabetes Paternal  Grandmother    Colon cancer Paternal Grandfather    Heart disease Paternal Grandfather    Diabetes Paternal Grandfather    Hyperlipidemia Maternal Grandmother    Hypertension Other        both sets of parents   Esophageal cancer Neg Hx    Rectal cancer Neg Hx    Stomach cancer Neg Hx      Current Outpatient Medications:    aspirin EC 81 MG tablet, Take 81 mg by mouth daily., Disp: , Rfl:    Cyanocobalamin (VITAMIN B 12) 500 MCG TABS, Take 500 mcg by mouth daily., Disp: , Rfl:    diphenhydrAMINE (BENADRYL) 25 MG tablet, Take 25 mg by mouth daily as needed (allergic reactions)., Disp: , Rfl:    EPINEPHrine (EPIPEN 2-PAK) 0.3 mg/0.3 mL IJ SOAJ injection, Inject 0.3 mLs (0.3 mg total) into the muscle once as needed (for severe allergic reaction). CAll 911 immediately if you have to use this medicine, Disp: 1 each, Rfl: 1   Evolocumab (REPATHA SURECLICK) [id number] MG/ML SOAJ, INJECT 140MG  INTO THE SKIN EVERY 14 DAYS, Disp: 2 mL, Rfl: 11   ezetimibe (ZETIA) 10 MG tablet, TAKE ONE TABLET EACH DAY, Disp: 90 tablet, Rfl: 3   ketoconazole (NIZORAL) 2 % cream, Apply topically daily., Disp: , Rfl:    Melatonin 1 MG CHEW, Chew 1 mg by mouth daily., Disp: , Rfl:    methocarbamol (ROBAXIN) 500 MG tablet, Take 1 tablet (500 mg total) by mouth every 6 (six) hours as needed for muscle spasms., Disp: 60 tablet, Rfl: 3   nitroGLYCERIN (NITROSTAT) 0.4 MG SL tablet, ONE TABLET UNDER TONGUE EVERY 5 MINUTES AS NEEDED FOR CHEST PAIN, Disp: 25 tablet, Rfl: 3   oxyCODONE-acetaminophen (PERCOCET) 5-325 MG tablet, Take 1 tablet by mouth every 4 (four) hours as needed for severe pain., Disp: 10 tablet, Rfl: 0   Polyethylene Glycol 3350 (MIRALAX PO), Take by mouth., Disp: , Rfl:    tadalafil (CIALIS) 5 MG tablet, Take 1 tablet (5 mg total) by mouth daily as needed for erectile dysfunction., Disp: 10 tablet, Rfl: 1   valACYclovir (VALTREX) 1000 MG tablet, Take 1,000 mg by mouth daily as needed., Disp: , Rfl:    verapamil  (CALAN) 80 MG tablet, TAKE ONE TABLET DAILY AS NEEDED, Disp: 90 tablet, Rfl: 2   verapamil (CALAN-SR) 180 MG CR tablet, TAKE ONE TABLET DAILY, Disp: 90 tablet, Rfl: 3   ondansetron (ZOFRAN ODT) 8 MG disintegrating tablet, Take 1 tablet (8 mg total) by mouth every 8 (eight) hours as needed for nausea or vomiting. (Patient not taking: Reported on 10/09/2021), Disp: 10 tablet, Rfl: 0  Review of Systems:  Constitutional: Denies fever, chills, diaphoresis, appetite change and fatigue.  HEENT: Denies photophobia, eye pain, redness, hearing loss, , mouth sores, trouble swallowing, neck  pain, neck stiffness and tinnitus.   Respiratory: Denies SOB, DOE,  chest tightness,  and wheezing.   Cardiovascular: Denies chest pain, palpitations and leg swelling.  Gastrointestinal: Denies nausea, vomiting, abdominal pain, diarrhea, constipation, blood in stool and abdominal distention.  Genitourinary: Denies dysuria, urgency, frequency, hematuria, flank pain and difficulty urinating.  Endocrine: Denies: hot or cold intolerance, sweats, changes in hair or nails, polyuria, polydipsia. Musculoskeletal: Denies myalgias, back pain, joint swelling, arthralgias and gait problem.  Skin: Denies pallor, rash and wound.  Neurological: Denies dizziness, seizures, syncope, weakness, light-headedness, numbness and headaches.  Hematological: Denies adenopathy. Easy bruising, personal or family bleeding history  Psychiatric/Behavioral: Denies suicidal ideation, mood changes, confusion, nervousness, sleep disturbance and agitation    Physical Exam: Vitals:   10/09/21 1351  BP: 110/78  Pulse: 100  Temp: 98.6 F (37 C)  TempSrc: Oral  SpO2: 97%  Weight: 204 lb 6.4 oz (92.7 kg)  Height: 5\' 8"  (1.727 m)    Body mass index is 31.08 kg/m.   Constitutional: NAD, calm, comfortable Eyes: PERRL, lids and conjunctivae normal, wears corrective lenses ENMT: Mucous membranes are moist. Posterior pharynx is erythematous but  clear of any exudate or lesions. Normal dentition. Tympanic membrane is pearly white, no erythema or bulging. Respiratory: clear to auscultation bilaterally, no wheezing, no crackles. Normal respiratory effort. No accessory muscle use.  Cardiovascular: Regular rate and rhythm, no murmurs / rubs / gallops. No extremity edema.  Psychiatric: Normal judgment and insight. Alert and oriented x 3. Normal mood.    Impression and Plan:  Flu-like symptoms - Plan: POC COVID-19  Viral upper respiratory tract infection  -Point-of-care flu and COVID test in office have been negative. -Could be a false negative as we have tested early on in his disease course.  Have advised that he repeat home COVID test in 48 hours. -In the meantime have advised symptomatic management with as needed pain relievers, decongestants, antihistamines. -He knows to reach out to in about 10 to 14 days if no improvement.  Time spent: 20 minutes reviewing chart, interviewing and examining patient and formulating plan of care.     [place], MD [facility] at [facility]

## 2021-10-12 ENCOUNTER — Telehealth: Payer: Self-pay | Admitting: Family Medicine

## 2021-10-12 MED ORDER — HYDROCODONE BIT-HOMATROP MBR 5-1.5 MG/5ML PO SOLN: 5.0000 mL | ORAL | 0 refills | Status: DC | PRN

## 2021-10-12 MED ORDER — NIRMATRELVIR/RITONAVIR (PAXLOVID)TABLET: 3.0000 | ORAL_TABLET | Freq: Two times a day (BID) | ORAL | 0 refills | Status: AC

## 2021-10-12 NOTE — Telephone Encounter (Signed)
I sent in Paxlovid and cough syrup to the pharmacy

## 2021-10-12 NOTE — Telephone Encounter (Signed)
Please advise 

## 2021-10-12 NOTE — Telephone Encounter (Signed)
Spoke with pt advised that Dr Clent Ridges sent Rx for Antiviral and cough medications to his pharmacy, pt verbalized understanding

## 2021-10-12 NOTE — Telephone Encounter (Addendum)
Pt seen dr Ardyth Harps on Friday 10-09-2021 and per pt dr Ardyth Harps suspected he may have covid even through the covid test was neg he was told to recheck with covid test and pt tested positive for covid on 10-11-2021. Pt cough is so bad he has not been able to sleep. Pt said OTC medication for his cough is not working.  Pt would like cough medication and antiviral medication send to   [facility] [place], [place] - [address] Phone:  [phone]  Fax:  [phone]     Pt is at home quarantining. Pt like a callback . [facility] pm so they will have time to deliver

## 2021-10-12 NOTE — Telephone Encounter (Signed)
Patient called back to see if prescription for covid will be sent in in the next hour or so, so he could have it delivered to his home. I let patient know that the message had been routed back to Dr.fry and his team via Nelva Bush, who he spoke with this morning. Patient was unhappy with how long it was taking for prescription to be sent and I let him know that Dr.Fry had the message but was seeing patients and we could not pull him from the exam room. Patient stated that he was one of Dr.Fry's patients too and he was upset with how he was being treated. He stated that he was not asking for an appointment or to speak with him directly, only to have the prescription sent in, which takes five minutes. I reiterated myself about the message being sent back and patient asked if there was anyone who could walk back there and tell him to send it. I told him that I understand he is upset and not feeling well but we were unable to do that because he is in with patients still. Patient asked if we were a part of [facility] and I said yes. He asked if there was anyone he could speak with because he is upset  at the treatment for his request. Patient stated that he has gotten worse, has been unable to sleep for the past three nights and is doing what Dr.Hernandez told him to due which was call back if symptoms worsen or he tested positive, so he can get prescription. I repeated myself again and told him if he would like to speak with someone, my supervisor was out of the office but I could leave her a note and she could call when available to possibly resolve the issue, and I would document everything for them to read. He apologized for sounding cranky and being upset but I let him know that I understand he is upset, and hopefully speaking with my supervisor would resolve his issue.   Please advise

## 2021-10-13 ENCOUNTER — Telehealth: Payer: Self-pay | Admitting: Internal Medicine

## 2021-10-13 MED ORDER — VERAPAMIL HCL 80 MG PO TABS: ORAL_TABLET | ORAL | 2 refills | Status: DC

## 2021-10-13 MED ORDER — EZETIMIBE 10 MG PO TABS: 10.0000 mg | ORAL_TABLET | Freq: Every day | ORAL | 3 refills | Status: DC

## 2021-10-13 MED ORDER — VERAPAMIL HCL ER 180 MG PO TBCR: 180.0000 mg | EXTENDED_RELEASE_TABLET | Freq: Every day | ORAL | 3 refills | Status: DC

## 2021-10-13 NOTE — Telephone Encounter (Signed)
Spoke with patient and relayed message from [facility]. He said he does not have a way to check BP, verapamil is for palps per patient (ordered by [facility] MD). Advised to monitor for s/sx of low BP -- lightheadedness, dizziness, pre-syncope, new/worsening fatigue or weakness. Change positions, slow changes in position.   Refilled zetia, verapamil 80mg  (PRN) and verapamil 180mg  to under Dr. per request

## 2021-10-13 NOTE — Telephone Encounter (Signed)
The pharmacist is correct. Paxlovid can increase verapamil concentrations which can theoretically cause his blood pressure to decrease.  However the CDC does not currently recommend dose adjustments. Patient should monitor his BP at home and watch for signs/symptoms of hypotension.

## 2021-10-13 NOTE — Telephone Encounter (Signed)
Routed to pharmacy team for assistance

## 2021-10-13 NOTE — Telephone Encounter (Signed)
New Message:     Patient have Covid and he is taking Paxlovid for it. He is also taking Verapamil. The pharmacist said that Paxlovid will make the Verapamil dose increase. He said the pharmacist told him to check with the doctor to see if he wanted to decrease his Verapamil dose.   Pt c/o medication issue:  1. Name of Medication: Verapamil  2. How are you currently taking this medication (dosage and times per day)?   3. Are you having a reaction (difficulty breathing--STAT)? no  4. What is your medication issue?  Taking  Verapamil  with Paxlovid

## 2021-12-01 ENCOUNTER — Ambulatory Visit (INDEPENDENT_AMBULATORY_CARE_PROVIDER_SITE_OTHER): Payer: Medicare Other | Admitting: Family Medicine

## 2021-12-01 VITALS — BP 140/70 | HR 100 | Temp 98.3°F | Wt 214.7 lb

## 2021-12-01 DIAGNOSIS — S40861A Insect bite (nonvenomous) of right upper arm, initial encounter: Secondary | ICD-10-CM | POA: Diagnosis not present

## 2021-12-01 DIAGNOSIS — W57XXXA Bitten or stung by nonvenomous insect and other nonvenomous arthropods, initial encounter: Secondary | ICD-10-CM

## 2021-12-01 NOTE — Progress Notes (Signed)
Established Patient Office Visit  Subjective:  Patient ID: Edward Curtis, male    DOB: 24-Sep-1956  Age: 66 y.o. MRN: [id number]  CC:  Chief Complaint  Patient presents with   Tick Removal    Tick bite. Back of Upper right arm, aching pain in the shoulder and upper arm, found tick this morning    HPI Edward Curtis presents for tick bite which he just noticed right posterior arm earlier today.  He thinks this came from being outdoors around some bamboo.  He looked in the mirror today and noticed dark speck.  He remove this in entirety.  This looks like a small deer tick.  He has little bit of localized soreness around the site of the bite.  No fever.  No headaches.  No other systemic rash.  He brings in tick today and this was not engorged.  Past Medical History:  Diagnosis Date   Allergy    seasonal allergies    Arthritis    Asthma    Blood transfusion without reported diagnosis 10/26/1983   Calcium oxalate renal stones    early 1990's   Cataract    Chicken pox    Chronic neck pain    Depression    GERD (gastroesophageal reflux disease)    Hyperlipidemia 10/10/2013   Hypertrophic obstructive cardiomyopathy(425.11) 10/10/2013   IBS (irritable bowel syndrome) 12/07/2011   Migraines    Neuromuscular disorder (HCC)    radiculopathy left side    Obesity (BMI 30-39.9) 10/10/2013   Seasonal allergies     Past Surgical History:  Procedure Laterality Date   CATARACT EXTRACTION Left 10/2020   CHOLECYSTECTOMY N/A 06/19/2021   Procedure: LAPAROSCOPIC CHOLECYSTECTOMY WITH POSSIBLE INTRAOPERATIVE CHOLANGIOGRAM;  Surgeon: Berna Bue, MD;  Location: [facility];  Service: General;  Laterality: N/A;   CORONARY STENT INTERVENTION N/A 03/23/2019   Procedure: CORONARY STENT INTERVENTION;  Surgeon: Marykay Lex, MD;  Location: [facility];  Service: Cardiovascular;  Laterality: N/A;   LEFT HEART CATH AND CORONARY ANGIOGRAPHY N/A 03/23/2019   Procedure: LEFT HEART CATH AND CORONARY  ANGIOGRAPHY;  Surgeon: Marykay Lex, MD;  Location: [facility];  Service: Cardiovascular;  Laterality: N/A;   left knee surgery  1990   MOUTH SURGERY  1975/1993   pre cancerous mole  2013   prostate procedure  1980   umbilical cyst  1985    Family History  Problem Relation Age of Onset   Arthritis Mother    Hyperlipidemia Mother    Heart disease Mother    Hypertension Mother    Heart disease Father    Hypertension Father    Diabetes Father    Arthritis Paternal Grandmother    Diabetes Paternal Grandmother    Colon cancer Paternal Grandfather    Heart disease Paternal Grandfather    Diabetes Paternal Grandfather    Hyperlipidemia Maternal Grandmother    Hypertension Other        both sets of parents   Esophageal cancer Neg Hx    Rectal cancer Neg Hx    Stomach cancer Neg Hx     Social History   Socioeconomic History   Marital status: Single    Spouse name: Not on file   Number of children: Not on file   Years of education: Not on file   Highest education level: Not on file  Occupational History   Not on file  Tobacco Use   Smoking status: Never   Smokeless tobacco: Never  Vaping Use   Vaping Use: Never used  Substance and Sexual Activity   Alcohol use: Yes    Alcohol/week: 1.0 standard drink    Types: 1 Glasses of wine per week    Comment: once a month   Drug use: No   Sexual activity: Not Currently  Other Topics Concern   Not on file  Social History Narrative   Not on file   Social Determinants of Health   Financial Resource Strain: Low Risk    Difficulty of Paying Living Expenses: Not hard at all  Food Insecurity: No Food Insecurity   Worried About Programme researcher, broadcasting/film/videounning Out of Food in the Last Year: Never true   Ran Out of Food in the Last Year: Never true  Transportation Needs: No Transportation Needs   Lack of Transportation (Medical): No   Lack of Transportation (Non-Medical): No  Physical Activity: Inactive   Days of Exercise per Week: 0 days    Minutes of Exercise per Session: 0 min  Stress: Stress Concern Present   Feeling of Stress : To some extent  Social Connections: Socially Isolated   Frequency of Communication with Friends and Family: More than three times a week   Frequency of Social Gatherings with Friends and Family: More than three times a week   Attends Religious Services: Never   Database administratorActive Member of Clubs or Organizations: No   Attends Engineer, structuralClub or Organization Meetings: Never   Marital Status: Never married  Catering managerntimate Partner Violence: Not At Risk   Fear of Current or Ex-Partner: No   Emotionally Abused: No   Physically Abused: No   Sexually Abused: No    Outpatient Medications Prior to Visit  Medication Sig Dispense Refill   aspirin EC 81 MG tablet Take 81 mg by mouth daily.     Cyanocobalamin (VITAMIN B 12) 500 MCG TABS Take 500 mcg by mouth daily.     diphenhydrAMINE (BENADRYL) 25 MG tablet Take 25 mg by mouth daily as needed (allergic reactions).     EPINEPHrine (EPIPEN 2-PAK) 0.3 mg/0.3 mL IJ SOAJ injection Inject 0.3 mLs (0.3 mg total) into the muscle once as needed (for severe allergic reaction). CAll 911 immediately if you have to use this medicine 1 each 1   Evolocumab (REPATHA SURECLICK) 140 MG/ML SOAJ INJECT 140MG  INTO THE SKIN EVERY 14 DAYS 2 mL 11   ezetimibe (ZETIA) 10 MG tablet Take 1 tablet (10 mg total) by mouth daily. 90 tablet 3   HYDROcodone bit-homatropine (HYCODAN) 5-1.5 MG/5ML syrup Take 5 mLs by mouth every 4 (four) hours as needed for cough. 240 mL 0   ketoconazole (NIZORAL) 2 % cream Apply topically daily.     Melatonin 1 MG CHEW Chew 1 mg by mouth daily.     methocarbamol (ROBAXIN) 500 MG tablet Take 1 tablet (500 mg total) by mouth every 6 (six) hours as needed for muscle spasms. 60 tablet 3   nitroGLYCERIN (NITROSTAT) 0.4 MG SL tablet ONE TABLET UNDER TONGUE EVERY 5 MINUTES AS NEEDED FOR CHEST PAIN 25 tablet 3   ondansetron (ZOFRAN ODT) 8 MG disintegrating tablet Take 1 tablet (8 mg total) by  mouth every 8 (eight) hours as needed for nausea or vomiting. 10 tablet 0   oxyCODONE-acetaminophen (PERCOCET) 5-325 MG tablet Take 1 tablet by mouth every 4 (four) hours as needed for severe pain. 10 tablet 0   Polyethylene Glycol 3350 (MIRALAX PO) Take by mouth.     tadalafil (CIALIS) 5 MG tablet Take 1 tablet (5  mg total) by mouth daily as needed for erectile dysfunction. 10 tablet 1   valACYclovir (VALTREX) 1000 MG tablet Take 1,000 mg by mouth daily as needed.     verapamil (CALAN) 80 MG tablet TAKE ONE TABLET DAILY AS NEEDED 90 tablet 2   verapamil (CALAN-SR) 180 MG CR tablet Take 1 tablet (180 mg total) by mouth daily. 90 tablet 3   No facility-administered medications prior to visit.    Allergies  Allergen Reactions   Ciprofloxacin Other (See Comments)    Possible "leg swelling, pain, unable to walk"   Iodinated Contrast Media Anaphylaxis, Hives, Itching and Swelling    Patient must be premedicated with 13 hour protocol prior to IV contrast administration   Penicillins Hives    Did it involve swelling of the face/tongue/throat, SOB, or low BP? No Did it involve sudden or severe rash/hives, skin peeling, or any reaction on the inside of your mouth or nose? Yes Did you need to seek medical attention at a hospital or doctor's office? Yes When did it last happen?      25 + years ago If all above answers are NO, may proceed with cephalosporin use.    Yellow Jacket Venom Anaphylaxis    Swelling    Atorvastatin     myaglias   Clindamycin/Lincomycin Diarrhea and Nausea Only   Erythromycin Diarrhea and Nausea Only   Ibuprofen Other (See Comments)    Causes tachycardia   Keflex [Cephalexin]     Rash hives   Other Itching and Other (See Comments)     [place] trees and walnuts , sores in mouth   Rosuvastatin     myaligias   Sulfa Antibiotics Hives   Latex Rash    Developed rash 06/2017 that progressed.  Allergy testing positive for latex allergy.  Later determined rash was 2/2  latex in [employer] foods.    ROS Review of Systems  Constitutional:  Negative for chills and fever.  Respiratory:  Negative for shortness of breath.   Skin:  Negative for rash.  Neurological:  Negative for headaches.     Objective:    Physical Exam Vitals reviewed.  Cardiovascular:     Rate and Rhythm: Normal rate and regular rhythm.  Skin:    Comments: He has approximately 1/2 x 1 cm area of mild erythema at the site of the bite with slightly punctate center.  No erythema migrans.  No pustules.  No vesicles.  Slightly hemorrhagic appearance near the center.  Neurological:     Mental Status: He is alert.    BP 140/70 (BP Location: Left Arm, Patient Position: Sitting, Cuff Size: Normal)    Pulse 100    Temp 98.3 F (36.8 C) (Oral)    Wt 214 lb 11.2 oz (97.4 kg)    SpO2 99%    BMI 32.65 kg/m  Wt Readings from Last 3 Encounters:  12/01/21 214 lb 11.2 oz (97.4 kg)  10/09/21 204 lb 6.4 oz (92.7 kg)  07/28/21 195 lb (88.5 kg)     Health Maintenance Due  Topic Date Due   Zoster Vaccines- Shingrix (1 of 2) Never done   COVID-19 Vaccine (3 - Moderna risk series) 04/16/2021   INFLUENZA VACCINE  05/25/2021    There are no preventive care reminders to display for this patient.  Lab Results  Component Value Date   TSH 4.68 06/04/2021   Lab Results  Component Value Date   WBC 12.3 (H) 06/20/2021   HGB 12.5 (L) 06/20/2021  HCT 37.6 (L) 06/20/2021   MCV 96.2 06/20/2021   PLT 155 06/20/2021   Lab Results  Component Value Date   NA 142 06/20/2021   K 4.0 06/20/2021   CO2 24 06/20/2021   GLUCOSE 117 (H) 06/20/2021   BUN 11 06/20/2021   CREATININE 0.67 06/20/2021   BILITOT 1.4 (H) 06/20/2021   ALKPHOS 50 06/20/2021   AST 31 06/20/2021   ALT 30 06/20/2021   PROT 6.7 06/20/2021   ALBUMIN 3.7 06/20/2021   CALCIUM 9.2 06/20/2021   ANIONGAP 7 06/20/2021   GFR 91.63 06/04/2021   Lab Results  Component Value Date   CHOL 109 07/10/2021   Lab Results  Component  Value Date   HDL 34 (L) 07/10/2021   Lab Results  Component Value Date   LDLCALC 48 07/10/2021   Lab Results  Component Value Date   TRIG 155 (H) 07/10/2021   Lab Results  Component Value Date   CHOLHDL 3.2 07/10/2021   Lab Results  Component Value Date   HGBA1C 5.7 06/04/2021      Assessment & Plan:   Problem List Items Addressed This Visit   None Visit Diagnoses     Tick bite of right upper arm, initial encounter    -  Primary     Suspect local allergic reaction.  We did review signs and symptoms of tick fever to watch out for such as headache, erythema migrans, arthralgias, etc.  Also watch out for any progressive erythema locally.  Follow-up promptly for any fever, headaches, generalized arthralgias, or other concerns  No orders of the defined types were placed in this encounter.   Follow-up: No follow-ups on file.    Evelena Peat, MD

## 2021-12-01 NOTE — Patient Instructions (Signed)
Suspect local allergy reaction   Watch for any fever, body aches, or other rash.

## 2022-01-28 ENCOUNTER — Telehealth: Payer: Self-pay | Admitting: *Deleted

## 2022-01-28 NOTE — Chronic Care Management (AMB) (Signed)
?  Care Management  ? ?Outreach Note ? ?01/28/2022 ?Name: Edward Curtis MRN: [id number] DOB: Jun 11, 1956 ? ?Referred by: [name], MD ?Reason for referral : Care Coordination (Initial outreach to schedule with [facility] ) ? ? ?An unsuccessful telephone outreach was attempted today. The patient was referred to the case management team for assistance with care management and care coordination.  ? ?Follow Up Plan:  ?A HIPAA compliant phone message was left for the patient providing contact information and requesting a return call.  ?The care management team will reach out to the patient again over the next 14 days. If patient returns call to provider office, please advise to call Embedded Care Management Care Guide Misty Stanley at [phone]. ? ?[name]  ?Care Guide, Embedded Care Coordination ?[place]  Care Management  ?Direct Dial: [phone] ? ?

## 2022-02-15 NOTE — Chronic Care Management (AMB) (Signed)
?  Care Management  ? ?Outreach Note ? ?02/15/2022 ?Name: Orlo Brickle MRN: [id number] DOB: [birth year] ? ?Referred by: [name], MD ?Reason for referral : Care Coordination (Initial outreach to schedule with [facility] ) ? ? ?Third unsuccessful telephone outreach was attempted today. The patient was referred to the case management team for assistance with care management and care coordination.  The care management team is pleased to engage with this patient at any time in the future should he/she be interested in assistance from the care management team.  ? ?Follow Up Plan:  ?We have been unable to make contact with the patient.The care management team is available to follow up with the patient after provider conversation with the patient regarding recommendation for care management engagement and subsequent re-referral to the care management team.  ?A HIPAA compliant phone message was left for the patient providing contact information and requesting a return call.  ? ?[name]  ?Care Guide, Embedded Care Coordination ?[place]  Care Management  ?Direct Dial: [phone] ? ?

## 2022-02-19 ENCOUNTER — Telehealth: Payer: Self-pay | Admitting: *Deleted

## 2022-02-19 NOTE — Chronic Care Management (AMB) (Signed)
?  Care Management  ? ?Note ? ?02/19/2022 ?Name: Edward Curtis MRN: [id number] DOB: [birth year] ? ?[name] is a 66 y.o. year old male who is a primary care patient of [name], MD. I reached out to [name] by phone today offer care coordination services.  ? ?Mr. Age was given information about care management services today including:  ?Care management services include personalized support from designated clinical staff supervised by his physician, including individualized plan of care and coordination with other care providers ?24/7 contact phone numbers for assistance for urgent and routine care needs. ?The patient may stop care management services at any time by phone call to the office staff. ? ?Patient agreed to services and verbal consent obtained.  ? ?Follow up plan: ?Telephone appointment with care management team member scheduled for:03/12/22 ? ?[name]  ?Care Guide, Embedded Care Coordination ?[place]  Care Management  ?Direct Dial: [phone] ? ?

## 2022-03-12 ENCOUNTER — Telehealth: Payer: Medicare Other

## 2022-03-16 ENCOUNTER — Telehealth: Payer: Self-pay | Admitting: Internal Medicine

## 2022-03-16 MED ORDER — REPATHA SURECLICK 140 MG/ML ~~LOC~~ SOAJ: SUBCUTANEOUS | 11 refills | Status: DC

## 2022-03-16 NOTE — Telephone Encounter (Signed)
Returned call to pt he states that he has a Physicist, medical from his [employer], he will bring this letter to our office for our review. Refilled Repatha.  I have scheduled annual appointment for 08-24-22 at 845AM. Pt verbalized understanding, states that appt is good for him.

## 2022-03-16 NOTE — Telephone Encounter (Signed)
Pt c/o medication issue:  1. Name of Medication:   Evolocumab (REPATHA SURECLICK) 140 MG/ML SOAJ    2. How are you currently taking this medication (dosage and times per day)? INJECT 140MG  INTO THE SKIN EVERY 14 DAYS  3. Are you having a reaction (difficulty breathing--STAT)?  No  4. What is your medication issue?  Pt states that his insurance company reached out to inform him that it is time for renewal for medication. Pt states that this has to be done by 04/29/22, he would like a callback from nurse. Please advise

## 2022-03-24 NOTE — Telephone Encounter (Signed)
PA for repatha submitted via CMM (Key: [id number])

## 2022-03-26 ENCOUNTER — Telehealth: Payer: Self-pay

## 2022-03-26 NOTE — Telephone Encounter (Signed)
Repatha approved by FEP  from 02/22/2022 through 03/24/2023.

## 2022-05-07 ENCOUNTER — Ambulatory Visit (INDEPENDENT_AMBULATORY_CARE_PROVIDER_SITE_OTHER): Payer: Medicare Other | Admitting: Family Medicine

## 2022-05-07 ENCOUNTER — Encounter: Payer: Self-pay | Admitting: Family Medicine

## 2022-05-07 VITALS — BP 110/78 | HR 74 | Temp 99.0°F | Wt 217.0 lb

## 2022-05-07 DIAGNOSIS — N410 Acute prostatitis: Secondary | ICD-10-CM | POA: Diagnosis not present

## 2022-05-07 DIAGNOSIS — R300 Dysuria: Secondary | ICD-10-CM | POA: Diagnosis not present

## 2022-05-07 LAB — POC URINALSYSI DIPSTICK (AUTOMATED)
Bilirubin, UA: NEGATIVE
Blood, UA: NEGATIVE
Glucose, UA: NEGATIVE
Ketones, UA: NEGATIVE
Leukocytes, UA: NEGATIVE
Nitrite, UA: NEGATIVE
Protein, UA: NEGATIVE
Spec Grav, UA: 1.02 (ref 1.010–1.025)
Urobilinogen, UA: 1 E.U./dL
pH, UA: 6 (ref 5.0–8.0)

## 2022-05-07 MED ORDER — DOXYCYCLINE HYCLATE 100 MG PO CAPS: 100.0000 mg | ORAL_CAPSULE | Freq: Two times a day (BID) | ORAL | 0 refills | Status: AC

## 2022-05-07 NOTE — Progress Notes (Signed)
   Subjective:    Patient ID: Edward Curtis, male    DOB: 02-20-1956, 66 y.o.   MRN: [id number]  HPI Here for 6 days of occasional light blood in the urine. He saw Dr. Liliane Shi, his urologist, 4 days ago and he had a CT scan of the kidneys. This showed a 1.5 mm stone in the left kidney, but the source of the bleeding was not shown. Then 3 days ago he developed some urinary urgency and frequency, some burning with urination, and some perineal pain. No fever. He says this feels like a prostate infection (which he has had before).    Review of Systems  Constitutional: Negative.   Respiratory: Negative.    Cardiovascular: Negative.   Gastrointestinal: Negative.   Genitourinary:  Positive for dysuria, frequency, hematuria and urgency. Negative for flank pain.       Objective:   Physical Exam Constitutional:      Appearance: Normal appearance. He is not ill-appearing.  Cardiovascular:     Rate and Rhythm: Normal rate and regular rhythm.     Pulses: Normal pulses.     Heart sounds: Normal heart sounds.  Pulmonary:     Effort: Pulmonary effort is normal.     Breath sounds: Normal breath sounds.  Abdominal:     Tenderness: There is no right CVA tenderness or left CVA tenderness.  Neurological:     Mental Status: He is alert.           Assessment & Plan:  He has a prostatitis, and this is almost surely the source of the hematuria. Treat with 10 days of Doxycycline. Culture the sample . Gershon Crane, MD

## 2022-05-09 LAB — URINE CULTURE
MICRO NUMBER:: 13648712
Result:: NO GROWTH
SPECIMEN QUALITY:: ADEQUATE

## 2022-05-18 ENCOUNTER — Ambulatory Visit (INDEPENDENT_AMBULATORY_CARE_PROVIDER_SITE_OTHER): Payer: Medicare Other

## 2022-05-18 VITALS — BP 110/60 | HR 89 | Temp 98.2°F | Ht 68.5 in | Wt 218.5 lb

## 2022-05-18 DIAGNOSIS — Z0000 Encounter for general adult medical examination without abnormal findings: Secondary | ICD-10-CM

## 2022-05-18 DIAGNOSIS — Z1211 Encounter for screening for malignant neoplasm of colon: Secondary | ICD-10-CM

## 2022-05-18 NOTE — Patient Instructions (Signed)
[name] , Thank you for taking time to come for your Medicare Wellness Visit. I appreciate your ongoing commitment to your health goals. Please review the following plan we discussed and let me know if I can assist you in the future.   Screening recommendations/referrals: Colonoscopy: n/a Recommended yearly ophthalmology/optometry visit for glaucoma screening and checkup Recommended yearly dental visit for hygiene and checkup  Vaccinations: Influenza vaccine: due 05/25/2022 Pneumococcal vaccine: completed 06/04/2021 Tdap vaccine: completed 12/27/2013, due 12/28/2023 Shingles vaccine: discussed   Covid-19:  03/19/2021, 02/29/2020, 02/01/2020  Advanced directives: Advance directive discussed with you today. Even though you declined this today please call our office should you change your mind and we can give you the proper paperwork for you to fill out.  Conditions/risks identified: none  Next appointment: Follow up in one year for your annual wellness visit.   Preventive Care 28 Years and Older, Male Preventive care refers to lifestyle choices and visits with your health care provider that can promote health and wellness. What does preventive care include? A yearly physical exam. This is also called an annual well check. Dental exams once or twice a year. Routine eye exams. Ask your health care provider how often you should have your eyes checked. Personal lifestyle choices, including: Daily care of your teeth and gums. Regular physical activity. Eating a healthy diet. Avoiding tobacco and drug use. Limiting alcohol use. Practicing safe sex. Taking low doses of aspirin every day. Taking vitamin and mineral supplements as recommended by your health care provider. What happens during an annual well check? The services and screenings done by your health care provider during your annual well check will depend on your age, overall health, lifestyle risk factors, and family history of  disease. Counseling  Your health care provider may ask you questions about your: Alcohol use. Tobacco use. Drug use. Emotional well-being. Home and relationship well-being. Sexual activity. Eating habits. History of falls. Memory and ability to understand (cognition). Work and work Astronomer. Screening  You may have the following tests or measurements: Height, weight, and BMI. Blood pressure. Lipid and cholesterol levels. These may be checked every 5 years, or more frequently if you are over 57 years old. Skin check. Lung cancer screening. You may have this screening every year starting at age 1 if you have a 30-pack-year history of smoking and currently smoke or have quit within the past 15 years. Fecal occult blood test (FOBT) of the stool. You may have this test every year starting at age 63. Flexible sigmoidoscopy or colonoscopy. You may have a sigmoidoscopy every 5 years or a colonoscopy every 10 years starting at age 56. Prostate cancer screening. Recommendations will vary depending on your family history and other risks. Hepatitis C blood test. Hepatitis B blood test. Sexually transmitted disease (STD) testing. Diabetes screening. This is done by checking your blood sugar (glucose) after you have not eaten for a while (fasting). You may have this done every 1-3 years. Abdominal aortic aneurysm (AAA) screening. You may need this if you are a current or former smoker. Osteoporosis. You may be screened starting at age 41 if you are at high risk. Talk with your health care provider about your test results, treatment options, and if necessary, the need for more tests. Vaccines  Your health care provider may recommend certain vaccines, such as: Influenza vaccine. This is recommended every year. Tetanus, diphtheria, and acellular pertussis (Tdap, Td) vaccine. You may need a Td booster every 10 years. Zoster vaccine. You  may need this after age 75. Pneumococcal 13-valent  conjugate (PCV13) vaccine. One dose is recommended after age 41. Pneumococcal polysaccharide (PPSV23) vaccine. One dose is recommended after age 64. Talk to your health care provider about which screenings and vaccines you need and how often you need them. This information is not intended to replace advice given to you by your health care provider. Make sure you discuss any questions you have with your health care provider. Document Released: 11/07/2015 Document Revised: 06/30/2016 Document Reviewed: 08/12/2015 Elsevier Interactive Patient Education  2017 [place] Prevention in the Home Falls can cause injuries. They can happen to people of all ages. There are many things you can do to make your home safe and to help prevent falls. What can I do on the outside of my home? Regularly fix the edges of walkways and driveways and fix any cracks. Remove anything that might make you trip as you walk through a door, such as a raised step or threshold. Trim any bushes or trees on the path to your home. Use bright outdoor lighting. Clear any walking paths of anything that might make someone trip, such as rocks or tools. Regularly check to see if handrails are loose or broken. Make sure that both sides of any steps have handrails. Any raised decks and porches should have guardrails on the edges. Have any leaves, snow, or ice cleared regularly. Use sand or salt on walking paths during winter. Clean up any spills in your garage right away. This includes oil or grease spills. What can I do in the bathroom? Use night lights. Install grab bars by the toilet and in the tub and shower. Do not use towel bars as grab bars. Use non-skid mats or decals in the tub or shower. If you need to sit down in the shower, use a plastic, non-slip stool. Keep the floor dry. Clean up any water that spills on the floor as soon as it happens. Remove soap buildup in the tub or shower regularly. Attach bath mats  securely with double-sided non-slip rug tape. Do not have throw rugs and other things on the floor that can make you trip. What can I do in the bedroom? Use night lights. Make sure that you have a light by your bed that is easy to reach. Do not use any sheets or blankets that are too big for your bed. They should not hang down onto the floor. Have a firm chair that has side arms. You can use this for support while you get dressed. Do not have throw rugs and other things on the floor that can make you trip. What can I do in the kitchen? Clean up any spills right away. Avoid walking on wet floors. Keep items that you use a lot in easy-to-reach places. If you need to reach something above you, use a strong step stool that has a grab bar. Keep electrical cords out of the way. Do not use floor polish or wax that makes floors slippery. If you must use wax, use non-skid floor wax. Do not have throw rugs and other things on the floor that can make you trip. What can I do with my stairs? Do not leave any items on the stairs. Make sure that there are handrails on both sides of the stairs and use them. Fix handrails that are broken or loose. Make sure that handrails are as long as the stairways. Check any carpeting to make sure that it is firmly  attached to the stairs. Fix any carpet that is loose or worn. Avoid having throw rugs at the top or bottom of the stairs. If you do have throw rugs, attach them to the floor with carpet tape. Make sure that you have a light switch at the top of the stairs and the bottom of the stairs. If you do not have them, ask someone to add them for you. What else can I do to help prevent falls? Wear shoes that: Do not have high heels. Have rubber bottoms. Are comfortable and fit you well. Are closed at the toe. Do not wear sandals. If you use a stepladder: Make sure that it is fully opened. Do not climb a closed stepladder. Make sure that both sides of the stepladder  are locked into place. Ask someone to hold it for you, if possible. Clearly mark and make sure that you can see: Any grab bars or handrails. First and last steps. Where the edge of each step is. Use tools that help you move around (mobility aids) if they are needed. These include: Canes. Walkers. Scooters. Crutches. Turn on the lights when you go into a dark area. Replace any light bulbs as soon as they burn out. Set up your furniture so you have a clear path. Avoid moving your furniture around. If any of your floors are uneven, fix them. If there are any pets around you, be aware of where they are. Review your medicines with your doctor. Some medicines can make you feel dizzy. This can increase your chance of falling. Ask your doctor what other things that you can do to help prevent falls. This information is not intended to replace advice given to you by your health care provider. Make sure you discuss any questions you have with your health care provider. Document Released: 08/07/2009 Document Revised: 03/18/2016 Document Reviewed: 11/15/2014 Elsevier Interactive Patient Education  2017 [employer].

## 2022-05-18 NOTE — Progress Notes (Signed)
Subjective:   Edward Curtis is a 66 y.o. male who presents for Medicare Annual/Subsequent preventive examination.  Review of Systems     Cardiac Risk Factors include: advanced age (>36men, >20 women);dyslipidemia;male gender;obesity (BMI >30kg/m2)     Objective:    Today's Vitals   05/18/22 0832  BP: 110/60  Pulse: 89  Temp: 98.2 F (36.8 C)  TempSrc: Oral  SpO2: 96%  Weight: 218 lb 8 oz (99.1 kg)  Height: 5' 8.5" (1.74 m)   Body mass index is 32.74 kg/m.     05/18/2022    8:42 AM 06/18/2021    8:55 PM 06/18/2021   10:14 AM 06/18/2021    2:14 AM 05/12/2021   10:00 AM 04/24/2021   11:33 AM 10/05/2019    1:55 PM  Advanced Directives  Does Patient Have a Medical Advance Directive? No No No No No No No  Would patient like information on creating a medical advance directive?  No - Patient declined No - Patient declined No - Patient declined Yes (MAU/Ambulatory/Procedural Areas - Information given)      Current Medications (verified) Outpatient Encounter Medications as of 05/18/2022  Medication Sig   aspirin EC 81 MG tablet Take 81 mg by mouth daily.   Cyanocobalamin (VITAMIN B 12) 500 MCG TABS Take 500 mcg by mouth daily.   diphenhydrAMINE (BENADRYL) 25 MG tablet Take 25 mg by mouth daily as needed (allergic reactions).   EPINEPHrine (EPIPEN 2-PAK) 0.3 mg/0.3 mL IJ SOAJ injection Inject 0.3 mLs (0.3 mg total) into the muscle once as needed (for severe allergic reaction). CAll 911 immediately if you have to use this medicine   Evolocumab (REPATHA SURECLICK) 140 MG/ML SOAJ INJECT 140MG  INTO THE SKIN EVERY 14 DAYS   ezetimibe (ZETIA) 10 MG tablet Take 1 tablet (10 mg total) by mouth daily.   HYDROcodone bit-homatropine (HYCODAN) 5-1.5 MG/5ML syrup Take 5 mLs by mouth every 4 (four) hours as needed for cough.   ketoconazole (NIZORAL) 2 % cream Apply topically daily.   Melatonin 1 MG CHEW Chew 1 mg by mouth daily.   methocarbamol (ROBAXIN) 500 MG tablet Take 1 tablet (500 mg total)  by mouth every 6 (six) hours as needed for muscle spasms.   nitroGLYCERIN (NITROSTAT) 0.4 MG SL tablet ONE TABLET UNDER TONGUE EVERY 5 MINUTES AS NEEDED FOR CHEST PAIN   oxyCODONE-acetaminophen (PERCOCET) 5-325 MG tablet Take 1 tablet by mouth every 4 (four) hours as needed for severe pain.   Polyethylene Glycol 3350 (MIRALAX PO) Take by mouth.   tadalafil (CIALIS) 5 MG tablet Take 1 tablet (5 mg total) by mouth daily as needed for erectile dysfunction.   valACYclovir (VALTREX) 1000 MG tablet Take 1,000 mg by mouth daily as needed.   verapamil (CALAN) 80 MG tablet TAKE ONE TABLET DAILY AS NEEDED   verapamil (CALAN-SR) 180 MG CR tablet Take 1 tablet (180 mg total) by mouth daily.   No facility-administered encounter medications on file as of 05/18/2022.    Allergies (verified) Ciprofloxacin, Iodinated contrast media, Penicillins, Yellow jacket venom, Atorvastatin, Clindamycin/lincomycin, Erythromycin, Ibuprofen, Keflex [cephalexin], Other, Rosuvastatin, Sulfa antibiotics, and Latex   History: Past Medical History:  Diagnosis Date   Allergy    seasonal allergies    Arthritis    Asthma    Blood transfusion without reported diagnosis 10/26/1983   Calcium oxalate renal stones    early 1990's   Cataract    Chicken pox    Chronic neck pain    Depression  GERD (gastroesophageal reflux disease)    Hyperlipidemia 10/10/2013   Hypertrophic obstructive cardiomyopathy(425.11) 10/10/2013   IBS (irritable bowel syndrome) 12/07/2011   Migraines    Neuromuscular disorder ([place])    radiculopathy left side    Obesity (BMI 30-39.9) 10/10/2013   Seasonal allergies    Past Surgical History:  Procedure Laterality Date   CATARACT EXTRACTION Left 10/2020   CHOLECYSTECTOMY N/A 06/19/2021   Procedure: LAPAROSCOPIC CHOLECYSTECTOMY WITH POSSIBLE INTRAOPERATIVE CHOLANGIOGRAM;  Surgeon: Clovis Riley, MD;  Location: [facility];  Service: General;  Laterality: N/A;   CORONARY STENT INTERVENTION N/A  03/23/2019   Procedure: CORONARY STENT INTERVENTION;  Surgeon: Leonie Man, MD;  Location: [facility];  Service: Cardiovascular;  Laterality: N/A;   LEFT HEART CATH AND CORONARY ANGIOGRAPHY N/A 03/23/2019   Procedure: LEFT HEART CATH AND CORONARY ANGIOGRAPHY;  Surgeon: Leonie Man, MD;  Location: [facility];  Service: Cardiovascular;  Laterality: N/A;   left knee surgery  1990   MOUTH SURGERY  1975/1993   pre cancerous mole  2013   prostate procedure  [id number]   umbilical cyst  [id number]   Family History  Problem Relation Age of Onset   Arthritis Mother    Hyperlipidemia Mother    Heart disease Mother    Hypertension Mother    Heart disease Father    Hypertension Father    Diabetes Father    Arthritis Paternal Grandmother    Diabetes Paternal Grandmother    Colon cancer Paternal Grandfather    Heart disease Paternal Grandfather    Diabetes Paternal Grandfather    Hyperlipidemia Maternal Grandmother    Hypertension Other        both sets of parents   Esophageal cancer Neg Hx    Rectal cancer Neg Hx    Stomach cancer Neg Hx    Social History   Socioeconomic History   Marital status: Single    Spouse name: Not on file   Number of children: Not on file   Years of education: Not on file   Highest education level: Not on file  Occupational History   Not on file  Tobacco Use   Smoking status: Never   Smokeless tobacco: Never  Vaping Use   Vaping Use: Never used  Substance and Sexual Activity   Alcohol use: Yes    Alcohol/week: 1.0 standard drink of alcohol    Types: 1 Glasses of wine per week    Comment: once a month   Drug use: No   Sexual activity: Not Currently  Other Topics Concern   Not on file  Social History Narrative   Not on file   Social Determinants of Health   Financial Resource Strain: Low Risk  (05/18/2022)   Overall Financial Resource Strain (CARDIA)    Difficulty of Paying Living Expenses: Not hard at all  Food Insecurity: No Food  Insecurity (05/18/2022)   Hunger Vital Sign    Worried About Running Out of Food in the Last Year: Never true    Ran Out of Food in the Last Year: Never true  Transportation Needs: No Transportation Needs (05/18/2022)   PRAPARE - Hydrologist (Medical): No    Lack of Transportation (Non-Medical): No  Physical Activity: Insufficiently Active (05/18/2022)   Exercise Vital Sign    Days of Exercise per Week: 4 days    Minutes of Exercise per Session: 30 min  Stress: No Stress Concern Present (05/18/2022)   [place]  [employer] - Occupational Stress Questionnaire    Feeling of Stress : Only a little  Social Connections: Socially Isolated (05/12/2021)   Social Connection and Isolation Panel [NHANES]    Frequency of Communication with Friends and Family: More than three times a week    Frequency of Social Gatherings with Friends and Family: More than three times a week    Attends Religious Services: Never    Database administrator or Organizations: No    Attends Engineer, structural: Never    Marital Status: Never married    Tobacco Counseling Counseling given: Not Answered   Clinical Intake:  Pre-visit preparation completed: Yes  Pain : No/denies pain     Nutritional Status: BMI > 30  Obese Nutritional Risks: None Diabetes: No  How often do you need to have someone help you when you read instructions, pamphlets, or other written materials from your doctor or pharmacy?: 1 - Never What is the last grade level you completed in school?: college  Diabetic? no  Interpreter Needed?: No  Information entered by :: NAllen LPN   Activities of Daily Living    05/18/2022    8:44 AM 06/18/2021    8:58 PM  In your present state of health, do you have any difficulty performing the following activities:  Hearing? 0   Vision? 0   Difficulty concentrating or making decisions? 0   Walking or climbing stairs? 0   Dressing or bathing?  0   Doing errands, shopping? 0 0  Preparing Food and eating ? N   Using the Toilet? N   In the past six months, have you accidently leaked urine? Y   Comment happens occasionally   Do you have problems with loss of bowel control? N   Managing your Medications? N   Managing your Finances? N   Housekeeping or managing your Housekeeping? N     Patient Care Team: [name], MD as PCP - General (Family Medicine) Rennis Golden Lisette Abu, MD as PCP - Cardiology (Cardiology) Othella Boyer, MD as Consulting Physician (Cardiology) Aris Lot, MD as Consulting Physician (Dermatology)  Indicate any recent Medical Services you may have received from other than [facility] providers in the past year (date may be approximate).     Assessment:   This is a routine wellness examination for Edward Curtis.  Hearing/Vision screen Vision Screening - Comments:: Regular eye exams, Dr. Elmer Picker  Dietary issues and exercise activities discussed: Current Exercise Habits: Home exercise routine, Type of exercise: walking, Time (Minutes): 30, Frequency (Times/Week): 4, Weekly Exercise (Minutes/Week): 120   Goals Addressed             This Visit's Progress    Patient Stated       05/18/2022, increasing exercise and lose weight (30-40 pounds)       Depression Screen    05/18/2022    8:43 AM 05/07/2022    2:08 PM 12/01/2021    1:59 PM 06/04/2021    9:08 AM 05/12/2021    9:52 AM  PHQ 2/9 Scores  PHQ - 2 Score 0 0 1 1 1   PHQ- 9 Score  1  1     Fall Risk    05/18/2022    8:43 AM 05/07/2022    2:07 PM 12/01/2021    1:59 PM 06/04/2021    9:07 AM 05/12/2021   10:01 AM  Fall Risk   Falls in the past year? 0 0 0 0 0  Number  falls in past yr: 0 0  0 0  Injury with Fall? 0 0  0 0  Risk for fall due to : Medication side effect No Fall Risks  No Fall Risks Impaired vision  Follow up Falls evaluation completed;Education provided;Falls prevention discussed Falls evaluation completed   Falls prevention discussed     FALL RISK PREVENTION PERTAINING TO THE HOME:  Any stairs in or around the home? Yes  If so, are there any without handrails?  yes Home free of loose throw rugs in walkways, pet beds, electrical cords, etc? Yes  Adequate lighting in your home to reduce risk of falls? Yes   ASSISTIVE DEVICES UTILIZED TO PREVENT FALLS:  Life alert? No  Use of a cane, walker or w/c? No  Grab bars in the bathroom? No  Shower chair or bench in shower? No  Elevated toilet seat or a handicapped toilet? No   TIMED UP AND GO:  Was the test performed? No .    Gait steady and fast without use of assistive device  Cognitive Function:        05/18/2022    8:45 AM 05/12/2021   10:04 AM  6CIT Screen  What Year? 0 points 0 points  What month? 0 points 0 points  What time? 0 points 0 points  Count back from 20 0 points 0 points  Months in reverse 0 points 0 points  Repeat phrase 0 points 0 points  Total Score 0 points 0 points    Immunizations Immunization History  Administered Date(s) Administered   Influenza,inj,Quad PF,6+ Mos 07/03/2014, 09/29/2015   Moderna SARS-COV2 Booster Vaccination 03/19/2021   Moderna Sars-Covid-2 Vaccination 02/01/2020, 02/29/2020   Pneumococcal Conjugate-13 06/04/2021   Tdap 12/27/2013    TDAP status: Up to date  Flu Vaccine status: Up to date  Pneumococcal vaccine status: Up to date  Covid-19 vaccine status: Completed vaccines  Qualifies for Shingles Vaccine? Yes   Zostavax completed No   Shingrix Completed?: needs second dose  Screening Tests Health Maintenance  Topic Date Due   Zoster Vaccines- Shingrix (1 of 2) Never done   COVID-19 Vaccine (3 - Moderna risk series) 04/16/2021   Pneumonia Vaccine 81+ Years old (2 - PPSV23 or PCV20) 06/04/2022   COLONOSCOPY (Pts 45-74yrs Insurance coverage will need to be confirmed)  11/12/2033 (Originally 01/23/2000)   INFLUENZA VACCINE  05/25/2022   TETANUS/TDAP  12/28/2023   Hepatitis C Screening  Completed    HPV VACCINES  Aged Out    Health Maintenance  Health Maintenance Due  Topic Date Due   Zoster Vaccines- Shingrix (1 of 2) Never done   COVID-19 Vaccine (3 - Moderna risk series) 04/16/2021   Pneumonia Vaccine 28+ Years old (2 - PPSV23 or PCV20) 06/04/2022    Colorectal cancer screening: Referral to GI placed today. Pt aware the office will call re: appt.  Lung Cancer Screening: (Low Dose CT Chest recommended if Age 64-80 years, 30 pack-year currently smoking OR have quit w/in 15years.) does not qualify.   Lung Cancer Screening Referral: no  Additional Screening:  Hepatitis C Screening: does qualify; Completed 11/12/2013  Vision Screening: Recommended annual ophthalmology exams for early detection of glaucoma and other disorders of the eye. Is the patient up to date with their annual eye exam?  Yes  Who is the provider or what is the name of the office in which the patient attends annual eye exams? [facility] If pt is not established with a provider, would they like to  be referred to a provider to establish care? No .   Dental Screening: Recommended annual dental exams for proper oral hygiene  Community Resource Referral / Chronic Care Management: CRR required this visit?  No   CCM required this visit?  No      Plan:     I have personally reviewed and noted the following in the patient's chart:   Medical and social history Use of alcohol, tobacco or illicit drugs  Current medications and supplements including opioid prescriptions. Patient is currently taking opioid prescriptions. Information provided to patient regarding non-opioid alternatives. Patient advised to discuss non-opioid treatment plan with their provider. Functional ability and status Nutritional status Physical activity Advanced directives List of other physicians Hospitalizations, surgeries, and ER visits in previous 12 months Vitals Screenings to include cognitive, depression, and falls Referrals and  appointments  In addition, I have reviewed and discussed with patient certain preventive protocols, quality metrics, and best practice recommendations. A written personalized care plan for preventive services as well as general preventive health recommendations were provided to patient.     Kellie Simmering, LPN   [phone]   Nurse Notes: none

## 2022-07-02 ENCOUNTER — Encounter: Payer: Medicare Other | Admitting: Family Medicine

## 2022-07-09 ENCOUNTER — Telehealth: Payer: Self-pay | Admitting: Family Medicine

## 2022-07-09 NOTE — Telephone Encounter (Signed)
FYI Spoke with pt advised that she can scheduled appointment for PNA 20, pt scheduled for 07/12/22 3.45 pm

## 2022-07-09 NOTE — Telephone Encounter (Signed)
Lvm with information PNA-13 Vaccine was  receive 06-04-21

## 2022-07-09 NOTE — Telephone Encounter (Signed)
Pt called to ask if MD could put in an order for a Pneumo vax?  Pt went to [facility] and was told they were not sure which one to give him.  Please advise.

## 2022-07-09 NOTE — Telephone Encounter (Signed)
Pt is calling and received pna vaccine last aug when he was in [place] having gallbladder surgery . Pt does not know which pna vaccine he received and need to get another one. Please advise

## 2022-07-12 ENCOUNTER — Ambulatory Visit (INDEPENDENT_AMBULATORY_CARE_PROVIDER_SITE_OTHER): Payer: Medicare Other

## 2022-07-12 DIAGNOSIS — Z23 Encounter for immunization: Secondary | ICD-10-CM

## 2022-07-23 ENCOUNTER — Encounter: Payer: Self-pay | Admitting: Internal Medicine

## 2022-07-23 DIAGNOSIS — E782 Mixed hyperlipidemia: Secondary | ICD-10-CM

## 2022-08-06 ENCOUNTER — Ambulatory Visit (INDEPENDENT_AMBULATORY_CARE_PROVIDER_SITE_OTHER): Payer: Medicare Other | Admitting: Family Medicine

## 2022-08-06 ENCOUNTER — Encounter: Payer: Self-pay | Admitting: Family Medicine

## 2022-08-06 VITALS — BP 110/70 | HR 89 | Temp 98.0°F | Wt 222.0 lb

## 2022-08-06 DIAGNOSIS — N419 Inflammatory disease of prostate, unspecified: Secondary | ICD-10-CM

## 2022-08-06 DIAGNOSIS — Z23 Encounter for immunization: Secondary | ICD-10-CM | POA: Diagnosis not present

## 2022-08-06 LAB — POC URINALSYSI DIPSTICK (AUTOMATED)
Bilirubin, UA: NEGATIVE
Blood, UA: NEGATIVE
Glucose, UA: NEGATIVE
Ketones, UA: NEGATIVE
Leukocytes, UA: NEGATIVE
Nitrite, UA: NEGATIVE
Protein, UA: POSITIVE — AB
Spec Grav, UA: 1.015 (ref 1.010–1.025)
Urobilinogen, UA: 1 E.U./dL
pH, UA: 6 (ref 5.0–8.0)

## 2022-08-06 MED ORDER — DOXYCYCLINE HYCLATE 100 MG PO CAPS: 100.0000 mg | ORAL_CAPSULE | Freq: Two times a day (BID) | ORAL | 0 refills | Status: DC

## 2022-08-06 NOTE — Progress Notes (Signed)
   Subjective:    Patient ID: Edward Curtis, male    DOB: 1956/04/05, 66 y.o.   MRN: [id number]  HPI Here for an apparent recurrence of prostatitis. We saw him on 05-07-22 for a prostatitis, and we treated it with 10 days of Doxycycline. This worked well and all his symptoms resolved. Then about 4 weeks these symptoms gradually returned. Now he has difficulty passing his urine at night, he has urgency to urinate, and he has had a few instances of urinary incontinence at night. No burning or blood in the urine. He says his ejaculate has turned a yellow color recently, though there is no pain with ejaculating. No fever.    Review of Systems  Constitutional: Negative.   Respiratory: Negative.    Cardiovascular: Negative.   Gastrointestinal: Negative.   Genitourinary:  Positive for difficulty urinating, dysuria, frequency and urgency. Negative for flank pain, hematuria, penile pain, scrotal swelling and testicular pain.       Objective:   Physical Exam Constitutional:      Appearance: Normal appearance. He is not ill-appearing.  Cardiovascular:     Rate and Rhythm: Normal rate and regular rhythm.     Pulses: Normal pulses.     Heart sounds: Normal heart sounds.  Pulmonary:     Effort: Pulmonary effort is normal.     Breath sounds: Normal breath sounds.  Neurological:     Mental Status: He is alert.           Assessment & Plan:  Recurrent prostatitis. This time we will treat him with Doxycycline for 30 days. Drink plenty of water. He will set up a well exam in a few weeks, and we will do a digital prostate exam at that time.  Alysia Penna, MD

## 2022-08-06 NOTE — Addendum Note (Signed)
Addended by: Wyvonne Lenz on: 08/06/2022 11:28 AM   Modules accepted: Orders

## 2022-08-08 LAB — URINE CULTURE
MICRO NUMBER:: 14048913
Result:: NO GROWTH
SPECIMEN QUALITY:: ADEQUATE

## 2022-08-11 ENCOUNTER — Other Ambulatory Visit: Payer: Self-pay

## 2022-08-11 DIAGNOSIS — L309 Dermatitis, unspecified: Secondary | ICD-10-CM

## 2022-08-11 DIAGNOSIS — N419 Inflammatory disease of prostate, unspecified: Secondary | ICD-10-CM

## 2022-08-11 MED ORDER — TAMSULOSIN HCL 0.4 MG PO CAPS: 0.4000 mg | ORAL_CAPSULE | Freq: Every day | ORAL | 3 refills | Status: DC

## 2022-08-14 LAB — LIPID PANEL
Chol/HDL Ratio: 2.6 ratio (ref 0.0–5.0)
Cholesterol, Total: 91 mg/dL — ABNORMAL LOW (ref 100–199)
HDL: 35 mg/dL — ABNORMAL LOW (ref >39)
LDL Chol Calc (NIH): 28 mg/dL (ref 0–99)
Triglycerides: 168 mg/dL — ABNORMAL HIGH (ref 0–149)
VLDL Cholesterol Cal: 28 mg/dL (ref 5–40)

## 2022-08-24 ENCOUNTER — Encounter: Payer: Self-pay | Admitting: Internal Medicine

## 2022-08-24 ENCOUNTER — Ambulatory Visit: Payer: Medicare Other | Attending: Internal Medicine | Admitting: Internal Medicine

## 2022-08-24 VITALS — BP 126/78 | HR 102 | Ht 68.0 in | Wt 225.8 lb

## 2022-08-24 DIAGNOSIS — T466X5D Adverse effect of antihyperlipidemic and antiarteriosclerotic drugs, subsequent encounter: Secondary | ICD-10-CM

## 2022-08-24 DIAGNOSIS — E782 Mixed hyperlipidemia: Secondary | ICD-10-CM | POA: Insufficient documentation

## 2022-08-24 DIAGNOSIS — I10 Essential (primary) hypertension: Secondary | ICD-10-CM | POA: Insufficient documentation

## 2022-08-24 DIAGNOSIS — M791 Myalgia, unspecified site: Secondary | ICD-10-CM | POA: Diagnosis not present

## 2022-08-24 DIAGNOSIS — I25119 Atherosclerotic heart disease of native coronary artery with unspecified angina pectoris: Secondary | ICD-10-CM | POA: Insufficient documentation

## 2022-08-24 DIAGNOSIS — T466X5A Adverse effect of antihyperlipidemic and antiarteriosclerotic drugs, initial encounter: Secondary | ICD-10-CM | POA: Insufficient documentation

## 2022-08-24 NOTE — Progress Notes (Signed)
LIPID CLINIC CONSULT NOTE  Chief Complaint:  Follow-up dyslipidemia  Primary Care Physician: Laurey Morale, MD  Primary Cardiologist:  Pixie Casino, MD  HPI:  Edward Curtis is a 66 y.o. male who is being seen today for the evaluation of dyslipidemia at the request of Laurey Morale, MD. This is a 66 year old male kindly referred by Dr. Bettina Gavia for evaluation and management of dyslipidemia.  He previously seen Karren Cobble, PharmD in our lipid clinic and was started on Repatha.  This was following several medication changes including a number of statins both high and low potency that he did not tolerate as well as adding ezetimibe.  He said he had only been on ezetimibe for a couple of weeks and then started on Repatha.  He has a history of low back pain and also has a history of coronary disease with a stent in 2020.  He was previously followed by Dr. Wynonia Lawman.  After giving himself the first injection of Repatha he reported low back pain which worsened over the course of about a week and eventually improved.  He felt that this could be related since he looked up side effects of the medicine which included back pain and then did not pursue any more doses of the medication.  Prior to starting the medicine in July his total cholesterol 217, triglycerides 220 HDL 30 and LDL of 147 with a target LDL of less than 70 given his coronary disease history.  12/31/2020  Edward Curtis returns today for follow-up.  He initially thought that he had side effects with the Repatha causing back pain.  It might be because he said he continued to have some back pain issues however it seemed that it seem to be getting better and better after every dose.  The last dose he noted no side effects from it.  His cholesterol has responded significantly.  His total is now 95, triglycerides 170, HDL 35 and LDL 32 (decreased from 147).  He remains on ezetimibe.  He is concerned because he is recently gained about 50 pounds.  He is  now scheduled to start a commercial weight loss program with weight watchers.  He is hoping to get that weight down in about 6 months.  07/21/2021  Edward Curtis returns today for follow-up of dyslipidemia.  Overall he seems to be doing well on his current therapies.  His cholesterol is gone up slightly with LDL now at 48 up from 32.  Triglycerides are lower at 155 and total cholesterol is risen slightly to 109.  Overall he is tolerating the medicines well.  He has no complaints.  He denies any chest pain or worsening shortness of breath.  He had seen Dr. Bettina Gavia earlier in the year who indicated that he may be retiring soon and that Edward Curtis may wish to speak with me about switching providers to me.  08/24/2022  Edward Curtis is seen today in follow-up.  He reports feeling well.  Denies any chest pain or worsening shortness of breath.  Unfortunately he has had some weight gain.  Despite this his lipids are improved further.  He remains on Repatha and ezetimibe.  Total cholesterol 91, triglycerides 168, HDL 35 and LDL 28.  Blood pressure is well controlled at 126/78.  Is on low-dose aspirin.  He says he is been less physically active recently and really wants to work to try to get the weight down and be more active.  He has had some  issues with his back and we talked about stretching exercises and other things he could do to help with this.  PMHx:  Past Medical History:  Diagnosis Date   Allergy    seasonal allergies    Arthritis    Asthma    Blood transfusion without reported diagnosis 10/26/1983   Calcium oxalate renal stones    early 1990's   Cataract    Chicken pox    Chronic neck pain    Depression    GERD (gastroesophageal reflux disease)    Hyperlipidemia 10/10/2013   Hypertrophic obstructive cardiomyopathy(425.11) 10/10/2013   IBS (irritable bowel syndrome) 12/07/2011   Migraines    Neuromuscular disorder (HCC)    radiculopathy left side    Obesity (BMI 30-39.9) 10/10/2013   Seasonal  allergies     Past Surgical History:  Procedure Laterality Date   CATARACT EXTRACTION Left 10/2020   CHOLECYSTECTOMY N/A 06/19/2021   Procedure: LAPAROSCOPIC CHOLECYSTECTOMY WITH POSSIBLE INTRAOPERATIVE CHOLANGIOGRAM;  Surgeon: Berna Bue, MD;  Location: [facility];  Service: General;  Laterality: N/A;   CORONARY STENT INTERVENTION N/A 03/23/2019   Procedure: CORONARY STENT INTERVENTION;  Surgeon: Marykay Lex, MD;  Location: [facility] INVASIVE CV LAB;  Service: Cardiovascular;  Laterality: N/A;   LEFT HEART CATH AND CORONARY ANGIOGRAPHY N/A 03/23/2019   Procedure: LEFT HEART CATH AND CORONARY ANGIOGRAPHY;  Surgeon: Marykay Lex, MD;  Location: [facility] INVASIVE CV LAB;  Service: Cardiovascular;  Laterality: N/A;   left knee surgery  1990   MOUTH SURGERY  1975/1993   pre cancerous mole  2013   prostate procedure  1980   umbilical cyst  1985    FAMHx:  Family History  Problem Relation Age of Onset   Arthritis Mother    Hyperlipidemia Mother    Heart disease Mother    Hypertension Mother    Heart disease Father    Hypertension Father    Diabetes Father    Arthritis Paternal Grandmother    Diabetes Paternal Grandmother    Colon cancer Paternal Grandfather    Heart disease Paternal Grandfather    Diabetes Paternal Grandfather    Hyperlipidemia Maternal Grandmother    Hypertension Other        both sets of parents   Esophageal cancer Neg Hx    Rectal cancer Neg Hx    Stomach cancer Neg Hx     SOCHx:   reports that he has never smoked. He has never used smokeless tobacco. He reports current alcohol use of about 1.0 standard drink of alcohol per week. He reports that he does not use drugs.  ALLERGIES:  Allergies  Allergen Reactions   Ciprofloxacin Other (See Comments)    Possible "leg swelling, pain, unable to walk"   Iodinated Contrast Media Anaphylaxis, Hives, Itching and Swelling    Patient must be premedicated with 13 hour protocol prior to IV contrast administration    Penicillins Hives    Did it involve swelling of the face/tongue/throat, SOB, or low BP? No Did it involve sudden or severe rash/hives, skin peeling, or any reaction on the inside of your mouth or nose? Yes Did you need to seek medical attention at a hospital or doctor's office? Yes When did it last happen?      25 + years ago If all above answers are "NO", may proceed with cephalosporin use.    Yellow Jacket Venom Anaphylaxis    Swelling    Atorvastatin     myaglias   Clindamycin/Lincomycin Diarrhea and  Nausea Only   Erythromycin Diarrhea and Nausea Only   Ibuprofen Other (See Comments)    Causes tachycardia   Keflex [Cephalexin]     Rash hives   Other Itching and Other (See Comments)     Walnut trees and walnuts , sores in mouth   Rosuvastatin     myaligias   Sulfa Antibiotics Hives   Latex Rash    Developed rash 06/2017 that progressed.  Allergy testing positive for latex allergy.  Later determined rash was 2/2 latex in [employer] foods.    ROS: Pertinent items noted in HPI and remainder of comprehensive ROS otherwise negative.  HOME MEDS: Current Outpatient Medications on File Prior to Visit  Medication Sig Dispense Refill   aspirin EC 81 MG tablet Take 81 mg by mouth daily.     Cyanocobalamin (VITAMIN B 12) 500 MCG TABS Take 500 mcg by mouth daily.     diphenhydrAMINE (BENADRYL) 25 MG tablet Take 25 mg by mouth daily as needed (allergic reactions).     doxycycline (VIBRAMYCIN) 100 MG capsule Take 1 capsule (100 mg total) by mouth 2 (two) times daily. 60 capsule 0   EPINEPHrine (EPIPEN 2-PAK) 0.3 mg/0.3 mL IJ SOAJ injection Inject 0.3 mLs (0.3 mg total) into the muscle once as needed (for severe allergic reaction). CAll 911 immediately if you have to use this medicine 1 each 1   Evolocumab (REPATHA SURECLICK) 140 MG/ML SOAJ INJECT 140MG  INTO THE SKIN EVERY 14 DAYS 2 mL 11   ezetimibe (ZETIA) 10 MG tablet Take 1 tablet (10 mg total) by mouth daily. 90 tablet 3   HYDROcodone  bit-homatropine (HYCODAN) 5-1.5 MG/5ML syrup Take 5 mLs by mouth every 4 (four) hours as needed for cough. 240 mL 0   ketoconazole (NIZORAL) 2 % cream Apply topically daily.     Melatonin 1 MG CHEW Chew 1 mg by mouth daily.     methocarbamol (ROBAXIN) 500 MG tablet Take 1 tablet (500 mg total) by mouth every 6 (six) hours as needed for muscle spasms. 60 tablet 3   nitroGLYCERIN (NITROSTAT) 0.4 MG SL tablet ONE TABLET UNDER TONGUE EVERY 5 MINUTES AS NEEDED FOR CHEST PAIN 25 tablet 3   Polyethylene Glycol 3350 (MIRALAX PO) Take by mouth.     tadalafil (CIALIS) 5 MG tablet Take 1 tablet (5 mg total) by mouth daily as needed for erectile dysfunction. 10 tablet 1   tamsulosin (FLOMAX) 0.4 MG CAPS capsule Take 1 capsule (0.4 mg total) by mouth daily. 90 capsule 3   valACYclovir (VALTREX) 1000 MG tablet Take 1,000 mg by mouth daily as needed.     verapamil (CALAN) 80 MG tablet TAKE ONE TABLET DAILY AS NEEDED 90 tablet 2   verapamil (CALAN-SR) 180 MG CR tablet Take 1 tablet (180 mg total) by mouth daily. 90 tablet 3   No current facility-administered medications on file prior to visit.    LABS/IMAGING: No results found for this or any previous visit (from the past 48 hour(s)). No results found.  LIPID PANEL:    Component Value Date/Time   CHOL 91 (L) 08/13/2022 0822   TRIG 168 (H) 08/13/2022 0822   HDL 35 (L) 08/13/2022 0822   CHOLHDL 2.6 08/13/2022 0822   CHOLHDL 3 06/04/2021 0849   VLDL 32.6 06/04/2021 0849   LDLCALC 28 08/13/2022 0822   LDLDIRECT 151.2 09/04/2014 0926    WEIGHTS: Wt Readings from Last 3 Encounters:  08/24/22 225 lb 12.8 oz (102.4 kg)  08/06/22 222 lb (100.7  kg)  05/18/22 218 lb 8 oz (99.1 kg)    VITALS: BP 126/78   Pulse (!) 102   Ht 5\' 8"  (1.727 m)   Wt 225 lb 12.8 oz (102.4 kg)   SpO2 98%   BMI 34.33 kg/m   EXAM: Deferred  EKG: Deferred  ASSESSMENT: CAD status post PCI in 2020 Mixed dyslipidemia, goal LDL less than 70 Statin  intolerance-myalgias Chronic low back pain Hypertension  PLAN: 1.   EdwardCurtis seems to be doing well without any recurrent coronary symptoms.  His LDL is at target although triglycerides are mildly elevated.  He is interested in some weight loss and more physical activity which I think would be very beneficial.  Hopefully if we are able to do this I might be able to stop his Zetia.  He says this is the most expensive medicine he takes.  He is struggling with some chronic low back pain we talked about stretching exercises and other things he could do before he goes walking which may help avert the need for muscle relaxants.  Plan follow-up with me in 6 months or sooner as necessary.  2021, MD, [facility], FACP  [place]  [facility]  Medical Director of the Advanced Lipid Disorders &  Cardiovascular Risk Reduction Clinic Diplomate of the American Board of Clinical Lipidology Attending Cardiologist  Direct Dial: [phone]  Fax: [phone]  Website:  [email or web address] 08/24/2022, 8:39 AM

## 2022-08-24 NOTE — Patient Instructions (Signed)
Medication Instructions:  NO CHANGES  *If you need a refill on your cardiac medications before your next appointment, please call your pharmacy*   Lab Work: FASTING Lipid Panel in 6 months  If you have labs (blood work) drawn today and your tests are completely normal, you will receive your results only by: [place] (if you have MyChart) OR A paper copy in the mail If you have any lab test that is abnormal or we need to change your treatment, we will call you to review the results.   Testing/Procedures: NONE   Follow-Up: At [facility], you and your health needs are our priority.  As part of our continuing mission to provide you with exceptional heart care, we have created designated Provider Care Teams.  These Care Teams include your primary Cardiologist (physician) and Advanced Practice Providers (APPs -  Physician Assistants and Nurse Practitioners) who all work together to provide you with the care you need, when you need it.  We recommend signing up for the patient portal called "MyChart".  Sign up information is provided on this After Visit Summary.  MyChart is used to connect with patients for Virtual Visits (Telemedicine).  Patients are able to view lab/test results, encounter notes, upcoming appointments, etc.  Non-urgent messages can be sent to your provider as well.   To learn more about what you can do with MyChart, go to [email or web address].    Your next appointment:    6 months with Dr. Debara Pickett  -- call in December or January for an appointment

## 2022-09-08 ENCOUNTER — Telehealth: Payer: Self-pay | Admitting: Internal Medicine

## 2022-09-08 NOTE — Telephone Encounter (Signed)
Pt c/o medication issue:  1. Name of Medication: Evolocumab (REPATHA SURECLICK) 140 MG/ML SOAJ   2. How are you currently taking this medication (dosage and times per day)? As prescribed  3. Are you having a reaction (difficulty breathing--STAT)? Yes  4. What is your medication issue? Patient states that he gave himself a shot on stomach yesterday 11/14 around 5:30 p.m. and a hard knot about the size quarter has formed and hasn't gone away. Requesting to speak to someone about this.

## 2022-09-08 NOTE — Telephone Encounter (Signed)
Spoke with patient who reported that yesterday, he got a knot at the site of his repatha injection. He stated the knot has gone down some since yesterday. Patient stated this has never happened before. This time, he gave the injection within 1/2 " of a scar. Patient teaching provided on where to give injections and how to position the bevel of the needle. Recommended patient place a warm washcloth over the area and lightly press into the knot. Pt will call clinic later today to advise of how the knot is looking.

## 2022-09-08 NOTE — Telephone Encounter (Signed)
F/U with patient. He just got home and hasn't used heat yet. He said the know it less swollen compared to yesterday.Will f/u later today.

## 2022-09-21 ENCOUNTER — Encounter: Payer: Self-pay | Admitting: Family Medicine

## 2022-09-21 ENCOUNTER — Ambulatory Visit (INDEPENDENT_AMBULATORY_CARE_PROVIDER_SITE_OTHER): Payer: Medicare Other | Admitting: Family Medicine

## 2022-09-21 VITALS — BP 118/80 | HR 94 | Temp 98.8°F | Ht 68.5 in | Wt 228.0 lb

## 2022-09-21 DIAGNOSIS — R7303 Prediabetes: Secondary | ICD-10-CM

## 2022-09-21 DIAGNOSIS — M545 Low back pain, unspecified: Secondary | ICD-10-CM | POA: Diagnosis not present

## 2022-09-21 DIAGNOSIS — J4520 Mild intermittent asthma, uncomplicated: Secondary | ICD-10-CM | POA: Diagnosis not present

## 2022-09-21 DIAGNOSIS — I25119 Atherosclerotic heart disease of native coronary artery with unspecified angina pectoris: Secondary | ICD-10-CM | POA: Diagnosis not present

## 2022-09-21 DIAGNOSIS — N401 Enlarged prostate with lower urinary tract symptoms: Secondary | ICD-10-CM | POA: Diagnosis not present

## 2022-09-21 DIAGNOSIS — F32A Depression, unspecified: Secondary | ICD-10-CM | POA: Diagnosis not present

## 2022-09-21 DIAGNOSIS — F419 Anxiety disorder, unspecified: Secondary | ICD-10-CM | POA: Diagnosis not present

## 2022-09-21 DIAGNOSIS — N138 Other obstructive and reflux uropathy: Secondary | ICD-10-CM | POA: Diagnosis not present

## 2022-09-21 DIAGNOSIS — G8929 Other chronic pain: Secondary | ICD-10-CM

## 2022-09-21 DIAGNOSIS — E785 Hyperlipidemia, unspecified: Secondary | ICD-10-CM

## 2022-09-21 DIAGNOSIS — K219 Gastro-esophageal reflux disease without esophagitis: Secondary | ICD-10-CM

## 2022-09-21 DIAGNOSIS — Z1211 Encounter for screening for malignant neoplasm of colon: Secondary | ICD-10-CM

## 2022-09-21 LAB — CBC WITH DIFFERENTIAL/PLATELET
Basophils Absolute: 0.1 10*3/uL (ref 0.0–0.1)
Basophils Relative: 0.8 % (ref 0.0–3.0)
Eosinophils Absolute: 0.1 10*3/uL (ref 0.0–0.7)
Eosinophils Relative: 2.2 % (ref 0.0–5.0)
HCT: 48.6 % (ref 39.0–52.0)
Hemoglobin: 16.5 g/dL (ref 13.0–17.0)
Lymphocytes Relative: 27.8 % (ref 12.0–46.0)
Lymphs Abs: 1.8 10*3/uL (ref 0.7–4.0)
MCHC: 34 g/dL (ref 30.0–36.0)
MCV: 94.4 fl (ref 78.0–100.0)
Monocytes Absolute: 0.6 10*3/uL (ref 0.1–1.0)
Monocytes Relative: 9 % (ref 3.0–12.0)
Neutro Abs: 3.8 10*3/uL (ref 1.4–7.7)
Neutrophils Relative %: 60.2 % (ref 43.0–77.0)
Platelets: 200 10*3/uL (ref 150.0–400.0)
RBC: 5.15 Mil/uL (ref 4.22–5.81)
RDW: 13.5 % (ref 11.5–15.5)
WBC: 6.3 10*3/uL (ref 4.0–10.5)

## 2022-09-21 LAB — HEPATIC FUNCTION PANEL
ALT: 25 U/L (ref 0–53)
AST: 22 U/L (ref 0–37)
Albumin: 4.6 g/dL (ref 3.5–5.2)
Alkaline Phosphatase: 68 U/L (ref 39–117)
Bilirubin, Direct: 0.1 mg/dL (ref 0.0–0.3)
Total Bilirubin: 0.7 mg/dL (ref 0.2–1.2)
Total Protein: 6.9 g/dL (ref 6.0–8.3)

## 2022-09-21 LAB — BASIC METABOLIC PANEL
BUN: 9 mg/dL (ref 6–23)
CO2: 26 mEq/L (ref 19–32)
Calcium: 9.3 mg/dL (ref 8.4–10.5)
Chloride: 106 mEq/L (ref 96–112)
Creatinine, Ser: 0.89 mg/dL (ref 0.40–1.50)
GFR: 88.58 mL/min (ref >60.00)
Glucose, Bld: 100 mg/dL — ABNORMAL HIGH (ref 70–99)
Potassium: 4.7 mEq/L (ref 3.5–5.1)
Sodium: 141 mEq/L (ref 135–145)

## 2022-09-21 LAB — POC URINALSYSI DIPSTICK (AUTOMATED)
Bilirubin, UA: NEGATIVE
Glucose, UA: NEGATIVE
Ketones, UA: NEGATIVE
Leukocytes, UA: NEGATIVE
Nitrite, UA: NEGATIVE
Protein, UA: NEGATIVE
Spec Grav, UA: 1.01 (ref 1.010–1.025)
Urobilinogen, UA: 0.2 E.U./dL
pH, UA: 6 (ref 5.0–8.0)

## 2022-09-21 LAB — TSH: TSH: 4.28 u[IU]/mL (ref 0.35–5.50)

## 2022-09-21 LAB — HEMOGLOBIN A1C: Hgb A1c MFr Bld: 6.2 % (ref 4.6–6.5)

## 2022-09-21 LAB — PSA: PSA: 0.45 ng/mL (ref 0.10–4.00)

## 2022-09-21 NOTE — Addendum Note (Signed)
Addended by: Gershon Crane A on: 09/21/2022 10:24 AM   Modules accepted: Orders

## 2022-09-21 NOTE — Addendum Note (Signed)
Addended by: Carola Rhine on: 09/21/2022 10:48 AM   Modules accepted: Orders

## 2022-09-21 NOTE — Progress Notes (Addendum)
Subjective:    Patient ID: Edward Curtis, male    DOB: November 25, 1955, 66 y.o.   MRN: [id number]  HPI Here to follow up on issues. He feels well in general. We treated him for a prostatitis in July with 10 days of Doxycycline. This seemed to resolve, but then last month the symptoms returned. We then treated this with 30 days of Doxycycline, and now he thinks the infection is gone. He had one cataract removed by Dr. Herbert Deaner earlier this year, and he likely will have the other removed after the first of next year. His anxiety and depression are stable. His asthma is stable. His low back pain is stable. He sees Dr. Debara Pickett regularly for CAD.    Review of Systems  Constitutional: Negative.   HENT: Negative.    Eyes: Negative.   Respiratory: Negative.    Cardiovascular: Negative.   Gastrointestinal: Negative.   Genitourinary: Negative.   Musculoskeletal:  Positive for back pain.  Skin: Negative.   Neurological: Negative.   Psychiatric/Behavioral: Negative.         Objective:   Physical Exam Constitutional:      General: He is not in acute distress.    Appearance: Normal appearance. He is well-developed. He is not diaphoretic.  HENT:     Head: Normocephalic and atraumatic.     Right Ear: External ear normal.     Left Ear: External ear normal.     Nose: Nose normal.     Mouth/Throat:     Pharynx: No oropharyngeal exudate.  Eyes:     General: No scleral icterus.       Right eye: No discharge.        Left eye: No discharge.     Conjunctiva/sclera: Conjunctivae normal.     Pupils: Pupils are equal, round, and reactive to light.  Neck:     Thyroid: No thyromegaly.     Vascular: No JVD.     Trachea: No tracheal deviation.  Cardiovascular:     Rate and Rhythm: Normal rate and regular rhythm.     Heart sounds: Normal heart sounds. No murmur heard.    No friction rub. No gallop.  Pulmonary:     Effort: Pulmonary effort is normal. No respiratory distress.     Breath sounds: Normal  breath sounds. No wheezing or rales.  Chest:     Chest wall: No tenderness.  Abdominal:     General: Bowel sounds are normal. There is no distension.     Palpations: Abdomen is soft. There is no mass.     Tenderness: There is no abdominal tenderness. There is no guarding or rebound.  Genitourinary:    Penis: Normal. No tenderness.      Testes: Normal.     Prostate: Normal.     Rectum: Normal. Guaiac result negative.  Musculoskeletal:        General: No tenderness. Normal range of motion.     Cervical back: Neck supple.  Lymphadenopathy:     Cervical: No cervical adenopathy.  Skin:    General: Skin is warm and dry.     Coloration: Skin is not pale.     Findings: No erythema or rash.  Neurological:     Mental Status: He is alert and oriented to person, place, and time.     Cranial Nerves: No cranial nerve deficit.     Motor: No abnormal muscle tone.     Coordination: Coordination normal.     Deep Tendon Reflexes: Reflexes  are normal and symmetric. Reflexes normal.  Psychiatric:        Behavior: Behavior normal.        Thought Content: Thought content normal.        Judgment: Judgment normal.           Assessment & Plan:  His recent prostatitis has resolved. His anxiety and depression are stable. His low back pain and asthma and CAD are stable. We will get fasting labs to check PSA, etc. We spent a total of ( 31  ) minutes reviewing records and discussing these issues. He declines to get a colonoscopy, so we will order a Cologuard test.  Gershon Crane, MD

## 2022-09-24 ENCOUNTER — Encounter: Payer: Self-pay | Admitting: Family Medicine

## 2022-09-24 NOTE — Telephone Encounter (Signed)
Yes he is correct, this range is now referred to as "prediabetes". His diet and exercise plans sound great. Let's repeat an A1c in 90 days

## 2022-09-27 ENCOUNTER — Other Ambulatory Visit: Payer: Self-pay

## 2022-09-27 DIAGNOSIS — R7303 Prediabetes: Secondary | ICD-10-CM

## 2022-10-26 ENCOUNTER — Ambulatory Visit (INDEPENDENT_AMBULATORY_CARE_PROVIDER_SITE_OTHER): Payer: Medicare Other | Admitting: Family Medicine

## 2022-10-26 ENCOUNTER — Encounter: Payer: Self-pay | Admitting: Family Medicine

## 2022-10-26 VITALS — BP 120/80 | HR 95 | Temp 98.2°F | Wt 222.0 lb

## 2022-10-26 DIAGNOSIS — R413 Other amnesia: Secondary | ICD-10-CM | POA: Diagnosis not present

## 2022-10-26 NOTE — Progress Notes (Signed)
   Subjective:    Patient ID: Edward Curtis, male    DOB: [birth year], 67 y.o.   MRN: [id number]  HPI Here with concerns about his memory. He has noticed this for several years, but it has been occurring more often lately. He describes daily examples of suddenly forgetting how to do something simple, like perform a function on his compueter. He often cannot find the word he wants to use during conversation. On several occasions he has forgotten where he was when driving his car in familiar areas locally. H is concerned because both his mother and his maternal grandfather developed Alzheimer's dementia in their 41s.    Review of Systems  Constitutional: Negative.   Respiratory: Negative.    Cardiovascular: Negative.   Neurological:  Negative for dizziness, tremors, seizures, syncope, facial asymmetry, speech difficulty, weakness, light-headedness, numbness and headaches.  Psychiatric/Behavioral: Negative.         Objective:   Physical Exam Constitutional:      Appearance: Normal appearance. He is not ill-appearing.  Cardiovascular:     Rate and Rhythm: Normal rate and regular rhythm.     Pulses: Normal pulses.     Heart sounds: Normal heart sounds.  Pulmonary:     Effort: Pulmonary effort is normal.  Neurological:     General: No focal deficit present.     Mental Status: He is alert.           Assessment & Plan:  Memory loss, we will refer him to Neurology to evaluate.  Alysia Penna, MD

## 2022-10-29 ENCOUNTER — Telehealth: Payer: Self-pay | Admitting: Family Medicine

## 2022-10-29 MED ORDER — DOXYCYCLINE HYCLATE 100 MG PO CAPS: 100.0000 mg | ORAL_CAPSULE | Freq: Two times a day (BID) | ORAL | 0 refills | Status: DC

## 2022-10-29 NOTE — Telephone Encounter (Signed)
Pt called to ask if MD could please send a refill of the antibiotic for his ongoing prostate issue, just to get Pt through the weekend.  Pt has also been scheduled for Monday 11/01/22.  Pt is asking for a call back to discuss, if necessary.  LOV: 10/26/22.   [place], [place] - [address] Phone: [phone]  Fax: [phone]

## 2022-10-29 NOTE — Telephone Encounter (Signed)
Done

## 2022-11-01 ENCOUNTER — Encounter: Payer: Self-pay | Admitting: Family Medicine

## 2022-11-01 ENCOUNTER — Ambulatory Visit (INDEPENDENT_AMBULATORY_CARE_PROVIDER_SITE_OTHER): Payer: Medicare Other | Admitting: Family Medicine

## 2022-11-01 VITALS — BP 100/70 | HR 104 | Temp 98.6°F | Ht 68.5 in | Wt 223.0 lb

## 2022-11-01 DIAGNOSIS — N410 Acute prostatitis: Secondary | ICD-10-CM | POA: Diagnosis not present

## 2022-11-01 DIAGNOSIS — R319 Hematuria, unspecified: Secondary | ICD-10-CM

## 2022-11-01 DIAGNOSIS — N138 Other obstructive and reflux uropathy: Secondary | ICD-10-CM

## 2022-11-01 DIAGNOSIS — R350 Frequency of micturition: Secondary | ICD-10-CM

## 2022-11-01 DIAGNOSIS — N401 Enlarged prostate with lower urinary tract symptoms: Secondary | ICD-10-CM

## 2022-11-01 LAB — POCT URINALYSIS DIPSTICK
Bilirubin, UA: NEGATIVE
Blood, UA: POSITIVE
Glucose, UA: NEGATIVE
Ketones, UA: NEGATIVE
Leukocytes, UA: NEGATIVE
Nitrite, UA: NEGATIVE
Protein, UA: NEGATIVE
Spec Grav, UA: 1.015 (ref 1.010–1.025)
Urobilinogen, UA: 0.2 E.U./dL
pH, UA: 5.5 (ref 5.0–8.0)

## 2022-11-01 MED ORDER — FINASTERIDE 5 MG PO TABS: 5.0000 mg | ORAL_TABLET | Freq: Every day | ORAL | 3 refills | Status: DC

## 2022-11-01 NOTE — Progress Notes (Signed)
   Subjective:    Patient ID: Edward Curtis, male    DOB: 14-Jan-1956, 67 y.o.   MRN: [id number]  HPI Here to follow up on BPH and prostatitis. He is starting on another 30 days of Doxycycline for the urgency and frequency of his urine. No fever. He had to stop taking Flomax because it caused sinus congestion and headaches.    Review of Systems  Constitutional: Negative.   Respiratory: Negative.    Cardiovascular: Negative.   Genitourinary:  Positive for difficulty urinating, dysuria, frequency and urgency. Negative for flank pain and hematuria.       Objective:   Physical Exam Constitutional:      Appearance: Normal appearance.  Cardiovascular:     Rate and Rhythm: Normal rate and regular rhythm.     Pulses: Normal pulses.     Heart sounds: Normal heart sounds.  Pulmonary:     Effort: Pulmonary effort is normal.     Breath sounds: Normal breath sounds.  Abdominal:     Tenderness: There is no right CVA tenderness or left CVA tenderness.  Neurological:     Mental Status: He is alert.           Assessment & Plan:  For the prostatitis, he will take the 30 days of Doxycycline. For the BPH, he will try Finasteride 5 mg daily. We will culture the urine sample.  Alysia Penna, MD

## 2022-11-02 ENCOUNTER — Ambulatory Visit: Payer: Medicare Other | Admitting: Physician Assistant

## 2022-11-02 LAB — URINE CULTURE
MICRO NUMBER:: 14401885
Result:: NO GROWTH
SPECIMEN QUALITY:: ADEQUATE

## 2022-11-09 ENCOUNTER — Other Ambulatory Visit: Payer: Self-pay | Admitting: Internal Medicine

## 2022-11-12 ENCOUNTER — Encounter: Payer: Self-pay | Admitting: Family Medicine

## 2022-11-15 IMAGING — DX DG CHEST 1V PORT
1 series · 1 of 1 positions shown · non-contrast
Comparison: 10/10/2013

CLINICAL DATA: Chest pain

EXAM:
PORTABLE CHEST 1 VIEW

[chest]
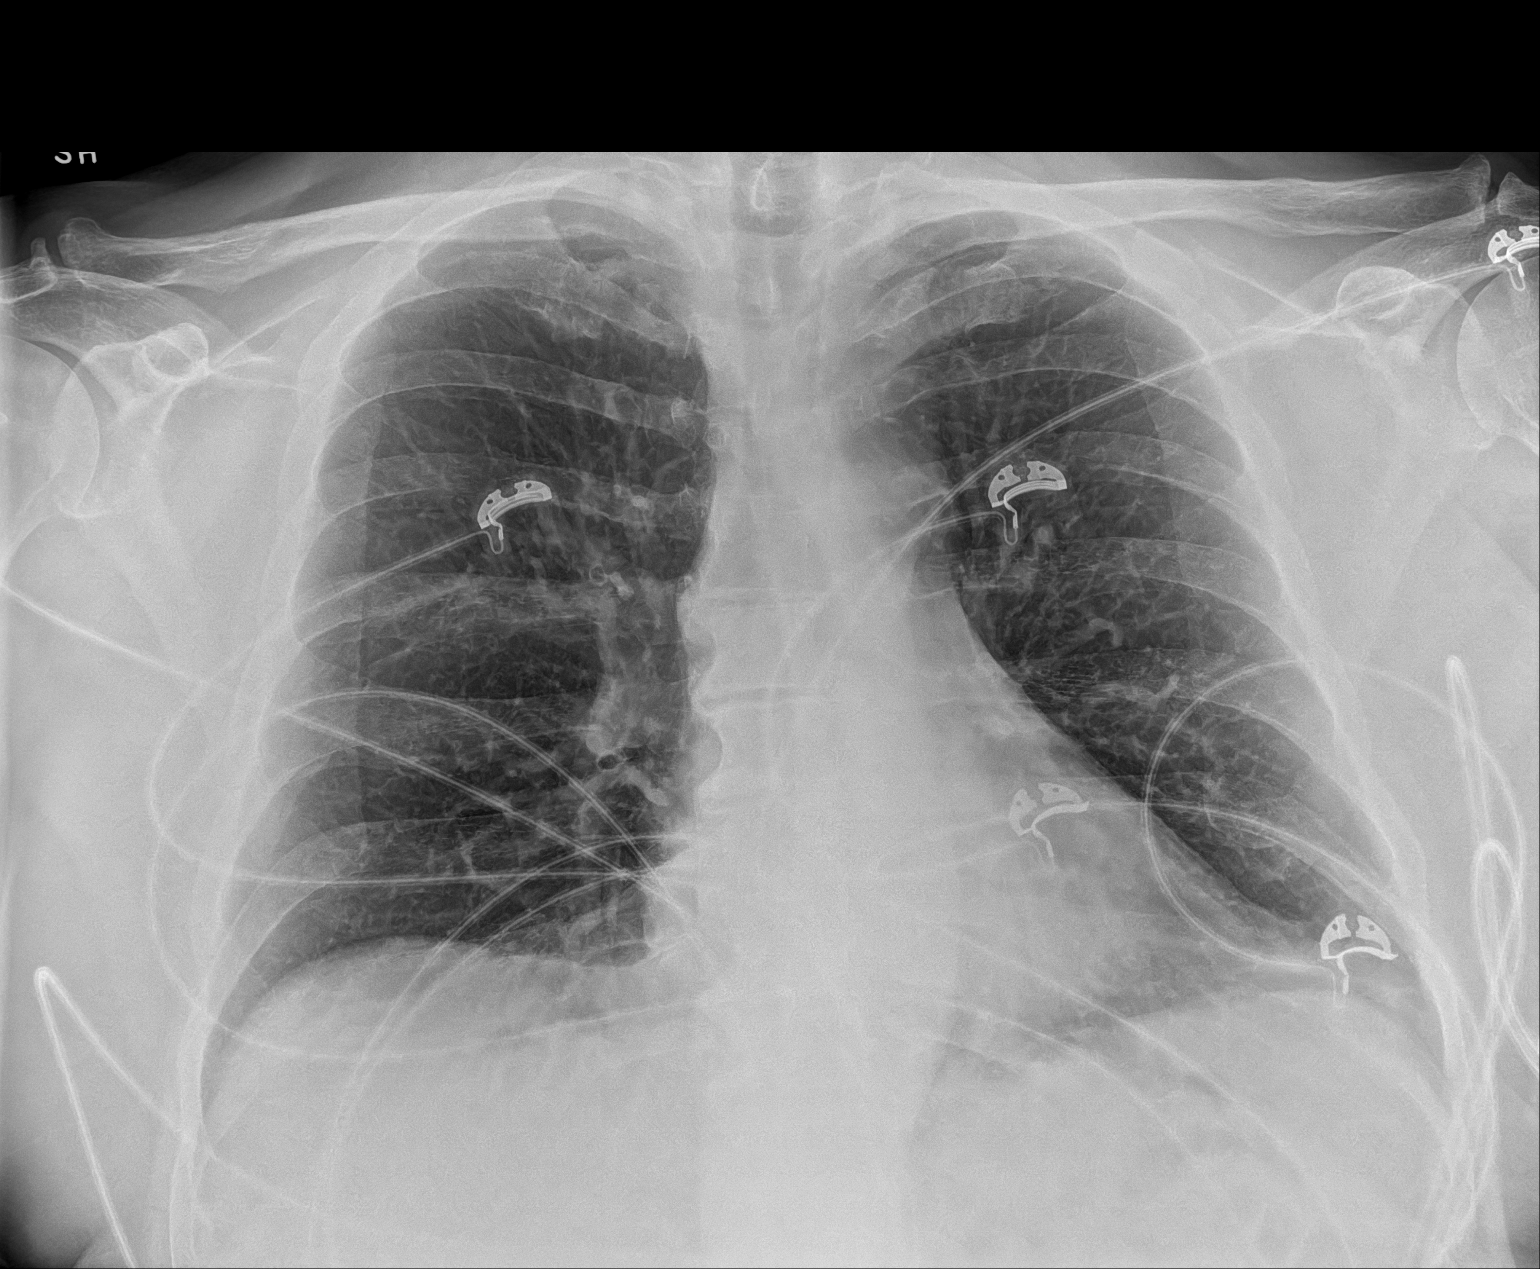

[1 of 1 positions shown; findings below may reference images not displayed]

FINDINGS: Thoracic spondylosis. Cardiac and mediastinal margins appear normal.
Linear subsegmental atelectasis or scarring at the left lung base.
The lungs appear otherwise clear. No blunting of the costophrenic
angles.
IMPRESSION: 1. Linear subsegmental atelectasis or scarring at the left lung
base.
2. Thoracic spondylosis.

## 2022-11-15 NOTE — Telephone Encounter (Signed)
He should finish the antibiotic but it is totally up to him to try the herbal products instead of the Finasteride. He can try this for a month and so how they work

## 2022-11-16 ENCOUNTER — Ambulatory Visit: Payer: Medicare Other | Admitting: Physician Assistant

## 2022-11-19 ENCOUNTER — Other Ambulatory Visit (INDEPENDENT_AMBULATORY_CARE_PROVIDER_SITE_OTHER): Payer: Medicare Other

## 2022-11-19 ENCOUNTER — Ambulatory Visit (INDEPENDENT_AMBULATORY_CARE_PROVIDER_SITE_OTHER): Payer: Medicare Other | Admitting: Physician Assistant

## 2022-11-19 ENCOUNTER — Encounter: Payer: Self-pay | Admitting: Physician Assistant

## 2022-11-19 VITALS — BP 127/81 | HR 93 | Resp 20 | Ht 68.0 in | Wt 226.0 lb

## 2022-11-19 DIAGNOSIS — R7303 Prediabetes: Secondary | ICD-10-CM

## 2022-11-19 DIAGNOSIS — R413 Other amnesia: Secondary | ICD-10-CM | POA: Diagnosis not present

## 2022-11-19 LAB — VITAMIN B12: Vitamin B-12: 145 pg/mL — ABNORMAL LOW (ref 211–911)

## 2022-11-19 NOTE — Progress Notes (Cosign Needed Addendum)
Assessment/Plan:   Edward Curtis is a very pleasant 67 y.o. year old RH male with a history of hypertension, hyperlipidemia, IBS, family history of Alzheimer's dementia in mother and grandfather seen today for evaluation of memory difficulties. MoCA today is 30/30.  Patient is claustrophobic, he would prefer to avoid imaging.  All questions were entertained, it is felt that the neurocognitive testing would be helpful to further evaluate any other underlying cause of memory changes.   Memory Difficulties   Neurocognitive testing to further evaluate cognitive concerns and determine other underlying cause of memory changes, including potential contribution from sleep, anxiety, or depression  Check B12  Recommend mood control as per PCP Recommend good control of cardiovascular risk factors Recommend psychotherapy for situational depression  Folllow up after neurocognitive testing results become available There is no indication for antidementia medication at this time.  Subjective:   The patient is here alone   How long did patient have memory difficulties?  He reports several year history of memory difficulties, worse over the last 6 months.  For example, if performing a function in the computer, twice he reports not remembering how to complete the task.  Sometimes he cannot remember the right words that he wants to use during a conversation.  He denies forgetting recent conversations although may encounter some trouble with people names.  "I used to have a photographic memory and remember many details about the conversation, but not now ".  He also has issues remembering phone numbers especially over the last 2 years.   Patient is concerned because both his mother and maternal grandfather had Alzheimer's dementia in their 47s.  He does admit that symptoms were worse before Christmas.  "After Christmas it feels that it got better ". repeats oneself?  Endorsed, by his sister, although not  frequently. Disoriented when walking into a room?  Patient denies except occasionally not remembering what patient came to the room for   Leaving objects in unusual places? denies   Wandering behavior?  denies   Any personality changes since last visit?  "A little more anxious and depressed, has been a tough year, my nephew died recently , for the last 2 yrs I lost several family and friends "  Any worsening depression?:  Patient had in the past for situational depression, in different instances.  "Recently, the patient has lost  a nephew, which brought significant amount of emotional pain. Hallucinations or paranoia?  Patient denies   Seizures?   Patient denies    Any sleep changes?  He has always had  vivid dreams, denies REM behavior or sleepwalking . As a child he had recurrent nightmares that he was drowning but not recently  Sleep apnea?  Patient denies   Any hygiene concerns?  Patient denies   Independent of bathing and dressing?  Endorsed  Does the patient needs help with medications?  Patient is in charge Who is in charge of the finances?  Patient is in charge Any changes in appetite?   denies Patient have trouble swallowing?   denies   Does the patient cook?   Yes . No accidents  Any headaches?   denies   Chronic back pain  He reports chronic back issues and has been treated by pain management  specialist for years, currently being treated by a chiropractor for back pain.    Ambulates with difficulty?    denies   Recent falls or head injuries?   denies     Unilateral  weakness, numbness or tingling?  denies   Any tremors?   denies   Any anosmia?  denies   Any incontinence of urine?  He had a recent history of frequent urination, with  prostatitis (history of BPH) Any bowel dysfunction?  He has a history of IBS, constipation per chart     Patient lives alone  History of heavy alcohol intake? denies   History of heavy tobacco use?  denies   Family history of dementia?   denies   Does patient drive? Endorsed, on one occasion during the fall, he was disoriented in an area that was known to him (around [place] airport).  Pertinent labs, TSH 09/21/2022 was 4.28, A1c 6.2  Past Medical History:  Diagnosis Date   Allergy    seasonal allergies    Arthritis    Asthma    Blood transfusion without reported diagnosis 10/26/1983   Calcium oxalate renal stones    early 1990's   Cataract    Chicken pox    Chronic neck pain    Depression    GERD (gastroesophageal reflux disease)    Hyperlipidemia 10/10/2013   Hypertrophic obstructive cardiomyopathy(425.11) 10/10/2013   IBS (irritable bowel syndrome) 12/07/2011   Migraines    Neuromuscular disorder (HCC)    radiculopathy left side    Obesity (BMI 30-39.9) 10/10/2013   Seasonal allergies      Past Surgical History:  Procedure Laterality Date   CATARACT EXTRACTION Left 10/2020   CHOLECYSTECTOMY N/A 06/19/2021   Procedure: LAPAROSCOPIC CHOLECYSTECTOMY WITH POSSIBLE INTRAOPERATIVE CHOLANGIOGRAM;  Surgeon: Clovis Riley, MD;  Location: [facility];  Service: General;  Laterality: N/A;   CORONARY STENT INTERVENTION N/A 03/23/2019   Procedure: CORONARY STENT INTERVENTION;  Surgeon: Leonie Man, MD;  Location: [place] [facility] [facility];  Service: Cardiovascular;  Laterality: N/A;   LEFT HEART CATH AND CORONARY ANGIOGRAPHY N/A 03/23/2019   Procedure: LEFT HEART CATH AND CORONARY ANGIOGRAPHY;  Surgeon: Leonie Man, MD;  Location: [place] [facility];  Service: Cardiovascular;  Laterality: N/A;   left knee surgery  1990   MOUTH SURGERY  1975/1993   pre cancerous mole  2013   prostate procedure  2778   umbilical cyst  2423     Allergies  Allergen Reactions   Ciprofloxacin Other (See Comments)    Possible "leg swelling, pain, unable to walk"   Iodinated Contrast Media Anaphylaxis, Hives, Itching and Swelling    Patient must be premedicated with 13 hour protocol prior to IV contrast administration    Penicillins Hives    Did it involve swelling of the face/tongue/throat, SOB, or low BP? No Did it involve sudden or severe rash/hives, skin peeling, or any reaction on the inside of your mouth or nose? Yes Did you need to seek medical attention at a hospital or doctor's office? Yes When did it last happen?      25 + years ago If all above answers are "NO", may proceed with cephalosporin use.    Yellow Jacket Venom Anaphylaxis    Swelling    Atorvastatin     myaglias   Clindamycin/Lincomycin Diarrhea and Nausea Only   Erythromycin Diarrhea and Nausea Only   Ibuprofen Other (See Comments)    Causes tachycardia   Keflex [Cephalexin]     Rash hives   Other Itching and Other (See Comments)     [place] trees and walnuts , sores in mouth   Rosuvastatin     myaligias   Sulfa  Antibiotics Hives   Latex Rash    Developed rash 06/2017 that progressed.  Allergy testing positive for latex allergy.  Later determined rash was 2/2 latex in [employer] foods.    Current Outpatient Medications  Medication Instructions   aspirin EC 81 mg, Oral, Daily   diphenhydrAMINE (BENADRYL) 25 mg, Oral, Daily PRN   doxycycline (VIBRAMYCIN) 100 mg, Oral, 2 times daily   EPINEPHrine (EPIPEN 2-PAK) 0.3 mg, Intramuscular, Once PRN, CAll 911 immediately if you have to use this medicine   Evolocumab (REPATHA SURECLICK) 093 MG/ML SOAJ INJECT 140MG  INTO THE SKIN EVERY 14 DAYS   ezetimibe (ZETIA) 10 mg, Oral, Daily   finasteride (PROSCAR) 5 mg, Oral, Daily   HYDROcodone bit-homatropine (HYCODAN) 5-1.5 MG/5ML syrup 5 mLs, Oral, Every 4 hours PRN   ketoconazole (NIZORAL) 2 % cream Topical, Daily   Melatonin 1 mg, Oral, Daily   methocarbamol (ROBAXIN) 500 mg, Oral, Every 6 hours PRN   nitroGLYCERIN (NITROSTAT) 0.4 MG SL tablet ONE TABLET UNDER TONGUE EVERY 5 MINUTES AS NEEDED FOR CHEST PAIN   Polyethylene Glycol 3350 (MIRALAX PO) Oral   tadalafil (CIALIS) 5 mg, Oral, Daily PRN   valACYclovir (VALTREX) 1,000 mg, Oral,  Daily PRN   verapamil (CALAN) 80 MG tablet TAKE ONE TABLET DAILY AS NEEDED   verapamil (CALAN-SR) 180 mg, Oral, Daily   Vitamin B 12 500 mcg, Oral, Daily     VITALS:   Vitals:   11/19/22 0952  BP: 127/81  Pulse: 93  Resp: 20  SpO2: 95%  Weight: 226 lb (102.5 kg)  Height: 5\' 8"  (1.727 m)      10/26/2022    3:49 PM 09/21/2022    9:46 AM 05/18/2022    8:43 AM 05/07/2022    2:08 PM 12/01/2021    1:59 PM  Depression screen PHQ 2/9  Decreased Interest 0 0 0 0 0  Down, Depressed, Hopeless 1 1 0 0 1  PHQ - 2 Score 1 1 0 0 1  Altered sleeping 0 0  1   Tired, decreased energy 0 0  0   Change in appetite 0 0  0   Feeling bad or failure about yourself  0 0  0   Trouble concentrating 0 0  0   Moving slowly or fidgety/restless 0 0  0   Suicidal thoughts 0 0  0   PHQ-9 Score 1 1  1    Difficult doing work/chores Not difficult at all Not difficult at all  Not difficult at all     PHYSICAL EXAM   HEENT:  Normocephalic, atraumatic. The mucous membranes are moist. The superficial temporal arteries are without ropiness or tenderness. Cardiovascular: Regular rate and rhythm. Lungs: Clear to auscultation bilaterally. Neck: There are no carotid bruits noted bilaterally.  NEUROLOGICAL:    11/19/2022   12:00 PM  Montreal Cognitive Assessment   Visuospatial/ Executive (0/5) 5  Naming (0/3) 3  Attention: Read list of digits (0/2) 2  Attention: Read list of letters (0/1) 1  Attention: Serial 7 subtraction starting at 100 (0/3) 3  Language: Repeat phrase (0/2) 2  Language : Fluency (0/1) 1  Abstraction (0/2) 2  Delayed Recall (0/5) 5  Orientation (0/6) 6  Total 30  Adjusted Score (based on education) 30        No data to display           Orientation:  Alert and oriented to person, place and time. No aphasia or dysarthria. Fund of knowledge is appropriate.  Recent and remote memory intact.  Attention and concentration are normal.  Able to name objects and repeat phrases. Delayed  recall 5/5    Cranial nerves: There is good facial symmetry. Extraocular muscles are intact and visual fields are full to confrontational testing. Speech is fluent and clear. No tongue deviation. Hearing is intact to conversational tone. Tone: Tone is good throughout. Sensation: Sensation is intact to light touch and pinprick throughout. Vibration is intact at the bilateral big toe.There is no extinction with double simultaneous stimulation. There is no sensory dermatomal level identified. Coordination: The patient has no difficulty with RAM's or FNF bilaterally. Normal finger to nose  Motor: Strength is 5/5 in the bilateral upper and lower extremities. There is no pronator drift. There are no fasciculations noted. DTR's: Deep tendon reflexes are 2/4 at the bilateral biceps, triceps, brachioradialis, patella and achilles.  Plantar responses are downgoing bilaterally. Gait and Station: The patient is able to ambulate without difficulty.The patient is able to heel toe walk without any difficulty.The patient is able to ambulate in a tandem fashion. The patient is able to stand in the Romberg position.     Thank you for allowing [place] the opportunity to participate in the care of this nice patient. Please do not hesitate to contact us for any questions or concerns.   Total time spent on today's visit was 62 minutes dedicated to this patient today, preparing to see patient, examining the patient, ordering tests and/or medications and counseling the patient, documenting clinical information in the EHR or other health record, independently interpreting results and communicating results to the patient/family, discussing treatment and goals, answering patient's questions and coordinating care.  Cc:  [name], MD  Marlowe Kays 11/19/2022 12:37 PM

## 2022-11-19 NOTE — Patient Instructions (Signed)
It was a pleasure to see you today at our office.   Recommendations:  Neurocognitive evaluation at our office Check labs today Follow up after neuropsych testing   Whom to call:  Memory  decline, memory medications: Call our office [phone]   For psychiatric meds, mood meds: Please have your primary care physician manage these medications.   Counseling regarding caregiver distress, including caregiver depression, anxiety and issues regarding community resources, adult day care programs, adult living facilities, or memory care questions:   Feel free to contact Misty Lisabeth Register, Social Worker at [phone]   For assessment of decision of mental capacity and competency:  Call Dr. Erick Blinks, geriatric psychiatrist at [phone]  For guidance in geriatric dementia issues please call [facility] [phone]  For guidance regarding [facility] and if placement were needed at the facility, contact Sidney Ace, Social Worker tel: [phone]  If you have any severe symptoms of a stroke, or other severe issues such as confusion,severe chills or fever, etc call 911 or go to the ER as you may need to be evaluated further   Feel free to visit Facebook page " [employer]" for tips of how to care for people with memory problems.   Feel free to go to the following database for funded clinical studies conducted around the world: RankChecks.se   https://www.triadclinicaltrials.com/     RECOMMENDATIONS FOR ALL PATIENTS WITH MEMORY PROBLEMS: 1. Continue to exercise (Recommend 30 minutes of walking everyday, or 3 hours every week) 2. Increase social interactions - continue going to [place] and enjoy social gatherings with friends and family 3. Eat healthy, avoid fried foods and eat more fruits and vegetables 4. Maintain adequate blood pressure, blood sugar, and blood cholesterol level. Reducing the risk of stroke and cardiovascular disease  also helps promoting better memory. 5. Avoid stressful situations. Live a simple life and avoid aggravations. Organize your time and prepare for the next day in anticipation. 6. Sleep well, avoid any interruptions of sleep and avoid any distractions in the bedroom that may interfere with adequate sleep quality 7. Avoid sugar, avoid sweets as there is a strong link between excessive sugar intake, diabetes, and cognitive impairment We discussed the Mediterranean diet, which has been shown to help patients reduce the risk of progressive memory disorders and reduces cardiovascular risk. This includes eating fish, eat fruits and green leafy vegetables, nuts like almonds and hazelnuts, walnuts, and also use olive oil. Avoid fast foods and fried foods as much as possible. Avoid sweets and sugar as sugar use has been linked to worsening of memory function.  There is always a concern of gradual progression of memory problems. If this is the case, then we may need to adjust level of care according to patient needs. Support, both to the patient and caregiver, should then be put into place.      You have been referred for a neuropsychological evaluation (i.e., evaluation of memory and thinking abilities). Please bring someone with you to this appointment if possible, as it is helpful for the doctor to hear from both you and another adult who knows you well. Please bring eyeglasses and hearing aids if you wear them.    The evaluation will take approximately 3 hours and has two parts:   The first part is a clinical interview with the neuropsychologist (Dr. Milbert Coulter or Dr. Roseanne Reno). During the interview, the neuropsychologist will speak with you and the individual you brought to the appointment.    The  second part of the evaluation is testing with the doctor's technician Hinton Dyer or Maudie Mercury). During the testing, the technician will ask you to remember different types of material, solve problems, and answer some  questionnaires. Your family member will not be present for this portion of the evaluation.   Please note: We must reserve several hours of the neuropsychologist's time and the psychometrician's time for your evaluation appointment. As such, there is a No-Show fee of $100. If you are unable to attend any of your appointments, please contact our office as soon as possible to reschedule.    FALL PRECAUTIONS: Be cautious when walking. Scan the area for obstacles that may increase the risk of trips and falls. When getting up in the mornings, sit up at the edge of the bed for a few minutes before getting out of bed. Consider elevating the bed at the head end to avoid drop of blood pressure when getting up. Walk always in a well-lit room (use night lights in the walls). Avoid area rugs or power cords from appliances in the middle of the walkways. Use a walker or a cane if necessary and consider physical therapy for balance exercise. Get your eyesight checked regularly.  FINANCIAL OVERSIGHT: Supervision, especially oversight when making financial decisions or transactions is also recommended.  HOME SAFETY: Consider the safety of the kitchen when operating appliances like stoves, microwave oven, and blender. Consider having supervision and share cooking responsibilities until no longer able to participate in those. Accidents with firearms and other hazards in the house should be identified and addressed as well.   ABILITY TO BE LEFT ALONE: If patient is unable to contact 911 operator, consider using LifeLine, or when the need is there, arrange for someone to stay with patients. Smoking is a fire hazard, consider supervision or cessation. Risk of wandering should be assessed by caregiver and if detected at any point, supervision and safe proof recommendations should be instituted.  MEDICATION SUPERVISION: Inability to self-administer medication needs to be constantly addressed. Implement a mechanism to ensure  safe administration of the medications.   DRIVING: Regarding driving, in patients with progressive memory problems, driving will be impaired. We advise to have someone else do the driving if trouble finding directions or if minor accidents are reported. Independent driving assessment is available to determine safety of driving.   If you are interested in the driving assessment, you can contact the following:  [employer] in [place]  [place] [phone] or [phone]    Arnold refers to food and lifestyle choices that are based on the traditions of countries located on the [employer]. This way of eating has been shown to help prevent certain conditions and improve outcomes for people who have chronic diseases, like kidney disease and heart disease. What are tips for following this plan? Lifestyle  Cook and eat meals together with your family, when possible. Drink enough fluid to keep your urine clear or pale yellow. Be physically active every day. This includes: Aerobic exercise like running or swimming. Leisure activities like gardening, walking, or housework. Get 7-8 hours of sleep each night. If recommended by your health care provider, drink red wine in moderation. This means 1 glass a day for nonpregnant women and 2 glasses a day for men. A glass of wine equals 5 oz (150 mL). Reading food labels  Check the serving size of packaged foods. For foods  such as rice and pasta, the serving size refers to the amount of cooked product, not dry. Check the total fat in packaged foods. Avoid foods that have saturated fat or trans fats. Check the ingredients list for added sugars, such as corn syrup. Shopping  At the grocery store, buy most of your food from the areas near the walls of the store. This includes: Fresh fruits and  vegetables (produce). Grains, beans, nuts, and seeds. Some of these may be available in unpackaged forms or large amounts (in bulk). Fresh seafood. Poultry and eggs. Low-fat dairy products. Buy whole ingredients instead of prepackaged foods. Buy fresh fruits and vegetables in-season from local farmers markets. Buy frozen fruits and vegetables in resealable bags. If you do not have access to quality fresh seafood, buy precooked frozen shrimp or canned fish, such as tuna, salmon, or sardines. Buy small amounts of raw or cooked vegetables, salads, or olives from the deli or salad bar at your store. Stock your pantry so you always have certain foods on hand, such as olive oil, canned tuna, canned tomatoes, rice, pasta, and beans. Cooking  Cook foods with extra-virgin olive oil instead of using butter or other vegetable oils. Have meat as a side dish, and have vegetables or grains as your main dish. This means having meat in small portions or adding small amounts of meat to foods like pasta or stew. Use beans or vegetables instead of meat in common dishes like chili or lasagna. Experiment with different cooking methods. Try roasting or broiling vegetables instead of steaming or sauteing them. Add frozen vegetables to soups, stews, pasta, or rice. Add nuts or seeds for added healthy fat at each meal. You can add these to yogurt, salads, or vegetable dishes. Marinate fish or vegetables using olive oil, lemon juice, garlic, and fresh herbs. Meal planning  Plan to eat 1 vegetarian meal one day each week. Try to work up to 2 vegetarian meals, if possible. Eat seafood 2 or more times a week. Have healthy snacks readily available, such as: Vegetable sticks with hummus. Greek yogurt. Fruit and nut trail mix. Eat balanced meals throughout the week. This includes: Fruit: 2-3 servings a day Vegetables: 4-5 servings a day Low-fat dairy: 2 servings a day Fish, poultry, or lean meat: 1 serving a  day Beans and legumes: 2 or more servings a week Nuts and seeds: 1-2 servings a day Whole grains: 6-8 servings a day Extra-virgin olive oil: 3-4 servings a day Limit red meat and sweets to only a few servings a month What are my food choices? Mediterranean diet Recommended Grains: Whole-grain pasta. Brown rice. Bulgar wheat. Polenta. Couscous. Whole-wheat bread. [place]. Vegetables: Artichokes. Beets. Broccoli. Cabbage. Carrots. Eggplant. Green beans. Chard. Kale. Spinach. Onions. Leeks. Peas. Squash. Tomatoes. Peppers. Radishes. Fruits: Apples. Apricots. Avocado. Berries. Bananas. Cherries. Dates. Figs. Grapes. Lemons. Melon. Oranges. Peaches. Plums. Pomegranate. Meats and other protein foods: Beans. Almonds. Sunflower seeds. Pine nuts. Peanuts. [place]. Salmon. Scallops. Shrimp. Reserve. Tilapia. Clams. Oysters. Eggs. Dairy: Low-fat milk. Cheese. Greek yogurt. Beverages: Water. Red wine. Herbal tea. Fats and oils: Extra virgin olive oil. Avocado oil. Grape seed oil. Sweets and desserts: [place] yogurt with honey. Baked apples. Poached pears. Trail mix. Seasoning and other foods: Basil. Cilantro. Coriander. Cumin. Mint. Parsley. Sage. Rosemary. Tarragon. Garlic. Oregano. Thyme. Pepper. Balsalmic vinegar. Tahini. Hummus. Tomato sauce. Olives. Mushrooms. Limit these Grains: Prepackaged pasta or rice dishes. Prepackaged cereal with added sugar. Vegetables: Deep fried potatoes (french fries). Fruits: Fruit canned in syrup.  Meats and other protein foods: Beef. Pork. Lamb. Poultry with skin. Hot dogs. Berniece Salines. Dairy: Ice cream. Sour cream. Whole milk. Beverages: Juice. Sugar-sweetened soft drinks. Beer. Liquor and spirits. Fats and oils: Butter. [place] oil. Vegetable oil. Beef fat (tallow). Lard. Sweets and desserts: Cookies. Cakes. Pies. Candy. Seasoning and other foods: Mayonnaise. Premade sauces and marinades. The items listed may not be a complete list. Talk with your dietitian about what  dietary choices are right for you. Summary The Mediterranean diet includes both food and lifestyle choices. Eat a variety of fresh fruits and vegetables, beans, nuts, seeds, and whole grains. Limit the amount of red meat and sweets that you eat. Talk with your health care provider about whether it is safe for you to drink red wine in moderation. This means 1 glass a day for nonpregnant women and 2 glasses a day for men. A glass of wine equals 5 oz (150 mL). This information is not intended to replace advice given to you by your health care provider. Make sure you discuss any questions you have with your health care provider. Document Released: 06/03/2016 Document Revised: 07/06/2016 Document Reviewed: 06/03/2016 Elsevier Interactive Patient Education  2017 [employer].

## 2022-11-20 NOTE — Progress Notes (Signed)
B12 very low at 145, we like it bet 400 and 1000, please start taking B12 1000 mcg daily and follow with primary doctor thanks

## 2022-12-23 ENCOUNTER — Encounter: Payer: Self-pay | Admitting: Internal Medicine

## 2022-12-23 NOTE — Telephone Encounter (Signed)
Medication samples have been provided to the patient.  Drug name: Repatha '140mg'$ /mL  Qty: 2 pen injectors  LOT: [id number]  Exp.Date: 01/22/25  Samples left at front desk for patient pick-up. Patient notified.  Fidel Levy 3:36 PM 12/23/2022

## 2023-01-03 ENCOUNTER — Other Ambulatory Visit: Payer: Self-pay | Admitting: Internal Medicine

## 2023-03-01 ENCOUNTER — Telehealth: Payer: Self-pay

## 2023-03-01 NOTE — Telephone Encounter (Signed)
Pharmacy Patient Advocate Encounter  Prior Authorization for Repatha 140mg /ml has been approved by [employer] FEP (ins).     Effective dates: 4.7.24 through 5.7.25

## 2023-03-07 ENCOUNTER — Encounter: Payer: Self-pay | Admitting: Internal Medicine

## 2023-03-11 MED ORDER — REPATHA SURECLICK 140 MG/ML ~~LOC~~ SOAJ: SUBCUTANEOUS | 11 refills | Status: DC

## 2023-03-24 ENCOUNTER — Other Ambulatory Visit (HOSPITAL_COMMUNITY): Payer: Self-pay

## 2023-03-30 ENCOUNTER — Other Ambulatory Visit: Payer: Self-pay | Admitting: Internal Medicine

## 2023-04-04 ENCOUNTER — Encounter: Payer: Self-pay | Admitting: Internal Medicine

## 2023-04-04 ENCOUNTER — Other Ambulatory Visit: Payer: Self-pay

## 2023-04-04 MED ORDER — VERAPAMIL HCL 80 MG PO TABS: ORAL_TABLET | ORAL | 1 refills | Status: DC

## 2023-04-04 NOTE — Telephone Encounter (Signed)
Matter addressed in another message. See chart.

## 2023-04-04 NOTE — Telephone Encounter (Signed)
Refill sent to pharmacy.   

## 2023-04-05 NOTE — Progress Notes (Signed)
Cardiology Clinic Note   Patient Name: Edward Curtis Date of Encounter: 04/12/2023  Primary Care Provider:  [name], MD Primary Cardiologist:  Norman Herrlich, MD/Dr. [id number] Lipid Management Cardiologist: Dr. Rennis Golden   Patient Profile    67 year old male with history of coronary artery disease status post PCI of his LAD on 03/23/2019, incomplete right bundle branch block, hyperlipidemia followed by Dr. Rennis Golden with known statin intolerance.  Last seen by Dr. Dulce Sellar on 02/03/2021 at which time he was referred to have Dr. Rennis Golden be his primary cardiologist.  He is being treated with Repatha and Zetia.  He also has chronic back pain.   Past Medical History    Past Medical History:  Diagnosis Date   Allergy    seasonal allergies    Arthritis    Asthma    Blood transfusion without reported diagnosis 10/26/1983   Calcium oxalate renal stones    early 1990's   Cataract    Chicken pox    Chronic neck pain    Depression    GERD (gastroesophageal reflux disease)    Hyperlipidemia 10/10/2013   Hypertrophic obstructive cardiomyopathy(425.11) 10/10/2013   IBS (irritable bowel syndrome) 12/07/2011   Migraines    Neuromuscular disorder (HCC)    radiculopathy left side    Obesity (BMI 30-39.9) 10/10/2013   Seasonal allergies    Past Surgical History:  Procedure Laterality Date   CATARACT EXTRACTION Left 10/2020   CHOLECYSTECTOMY N/A 06/19/2021   Procedure: LAPAROSCOPIC CHOLECYSTECTOMY WITH POSSIBLE INTRAOPERATIVE CHOLANGIOGRAM;  Surgeon: Berna Bue, MD;  Location: [facility];  Service: General;  Laterality: N/A;   CORONARY STENT INTERVENTION N/A 03/23/2019   Procedure: CORONARY STENT INTERVENTION;  Surgeon: Marykay Lex, MD;  Location: [facility] INVASIVE CV LAB;  Service: Cardiovascular;  Laterality: N/A;   LEFT HEART CATH AND CORONARY ANGIOGRAPHY N/A 03/23/2019   Procedure: LEFT HEART CATH AND CORONARY ANGIOGRAPHY;  Surgeon: Marykay Lex, MD;  Location: [facility] INVASIVE CV LAB;  Service:  Cardiovascular;  Laterality: N/A;   left knee surgery  1990   MOUTH SURGERY  1975/1993   pre cancerous mole  2013   prostate procedure  1980   umbilical cyst  1985    Allergies  Allergies  Allergen Reactions   Ciprofloxacin Other (See Comments)    Possible "leg swelling, pain, unable to walk"   Iodinated Contrast Media Anaphylaxis, Hives, Itching and Swelling    Patient must be premedicated with 13 hour protocol prior to IV contrast administration   Penicillins Hives    Did it involve swelling of the face/tongue/throat, SOB, or low BP? No Did it involve sudden or severe rash/hives, skin peeling, or any reaction on the inside of your mouth or nose? Yes Did you need to seek medical attention at a hospital or doctor's office? Yes When did it last happen?      25 + years ago If all above answers are "NO", may proceed with cephalosporin use.    Yellow Jacket Venom Anaphylaxis    Swelling    Atorvastatin     myaglias   Clindamycin/Lincomycin Diarrhea and Nausea Only   Erythromycin Diarrhea and Nausea Only   Ibuprofen Other (See Comments)    Causes tachycardia   Keflex [Cephalexin]     Rash hives   Other Itching and Other (See Comments)     Walnut trees and walnuts , sores in mouth   Rosuvastatin     myaligias   Sulfa Antibiotics Hives   Latex Rash  Developed rash 06/2017 that progressed.  Allergy testing positive for latex allergy.  Later determined rash was 2/2 latex in [employer]ystem foods.    History of Present Illness    Mr. Lamberti comes today for ongoing assessment and management of coronary artery disease, hypercholesterolemia, hypertension, last seen by Dr. Rennis Golden on 08/24/2022 and was stable from cardiac standpoint.  He remained on Repatha and Zetia along with aspirin.    He is doing very well today.  He has lost 16 pounds, walks 30 minutes every day, has reduced salt, and has a goal to lose weight 172 pounds.  The patient states that he was noticing shortness of breath  when he walked, and extra palpitations and lower extremity edema which prompted him to reduce his salt intake, and begin the Nutrisystem weight loss program.  Since losing the weight he feels better has more energy and feels less palpitations.  He is also here for refills on medications.  Home Medications    Current Outpatient Medications  Medication Sig Dispense Refill   aspirin EC 81 MG tablet Take 81 mg by mouth daily.     Cyanocobalamin (VITAMIN B 12) 500 MCG TABS Take 500 mcg by mouth daily.     diphenhydrAMINE (BENADRYL) 25 MG tablet Take 25 mg by mouth daily as needed (allergic reactions).     EPINEPHrine (EPIPEN 2-PAK) 0.3 mg/0.3 mL IJ SOAJ injection Inject 0.3 mLs (0.3 mg total) into the muscle once as needed (for severe allergic reaction). CAll 911 immediately if you have to use this medicine 1 each 1   Evolocumab (REPATHA SURECLICK) 140 MG/ML SOAJ INJECT 140MG  INTO THE SKIN EVERY 14 DAYS 2 mL 11   ezetimibe (ZETIA) 10 MG tablet TAKE ONE TABLET BY MOUTH ONCE DAILY 90 tablet 1   ketoconazole (NIZORAL) 2 % cream Apply topically daily.     Melatonin 1 MG CHEW Chew 1 mg by mouth daily.     methocarbamol (ROBAXIN) 500 MG tablet Take 1 tablet (500 mg total) by mouth every 6 (six) hours as needed for muscle spasms. 60 tablet 3   nitroGLYCERIN (NITROSTAT) 0.4 MG SL tablet ONE TABLET UNDER TONGUE EVERY 5 MINUTES AS NEEDED FOR CHEST PAIN 25 tablet 3   Polyethylene Glycol 3350 (MIRALAX PO) Take by mouth.     tadalafil (CIALIS) 5 MG tablet Take 1 tablet (5 mg total) by mouth daily as needed for erectile dysfunction. 10 tablet 1   valACYclovir (VALTREX) 1000 MG tablet Take 1,000 mg by mouth daily as needed.     verapamil (CALAN) 80 MG tablet TAKE ONE TABLET DAILY AS NEEDED 30 tablet 1   verapamil (CALAN-SR) 180 MG CR tablet TAKE ONE TABLET BY MOUTH ONCE DAILY 90 tablet 3   doxycycline (VIBRAMYCIN) 100 MG capsule Take 1 capsule (100 mg total) by mouth 2 (two) times daily. (Patient not taking:  Reported on 04/12/2023) 60 capsule 0   finasteride (PROSCAR) 5 MG tablet Take 1 tablet (5 mg total) by mouth daily. (Patient not taking: Reported on 04/12/2023) 90 tablet 3   HYDROcodone bit-homatropine (HYCODAN) 5-1.5 MG/5ML syrup Take 5 mLs by mouth every 4 (four) hours as needed for cough. (Patient not taking: Reported on 04/12/2023) 240 mL 0   No current facility-administered medications for this visit.     Family History    Family History  Problem Relation Age of Onset   Arthritis Mother    Hyperlipidemia Mother    Heart disease Mother    Hypertension Mother  Heart disease Father    Hypertension Father    Diabetes Father    Arthritis Paternal Grandmother    Diabetes Paternal Grandmother    Colon cancer Paternal Grandfather    Heart disease Paternal Grandfather    Diabetes Paternal Grandfather    Hyperlipidemia Maternal Grandmother    Hypertension Other        both sets of parents   Esophageal cancer Neg Hx    Rectal cancer Neg Hx    Stomach cancer Neg Hx    He indicated that his mother is deceased. He indicated that his father is alive. He indicated that his sister is alive. He indicated that his brother is alive. He indicated that the status of his maternal grandmother is unknown. He indicated that the status of his paternal grandmother is unknown. He indicated that the status of his paternal grandfather is unknown. He indicated that the status of his neg hx is unknown. He indicated that the status of his other is unknown.  Social History    Social History   Socioeconomic History   Marital status: Single    Spouse name: Not on file   Number of children: 0   Years of education: 16   Highest education level: Bachelor's degree (e.g., BA, AB, BS)  Occupational History   Not on file  Tobacco Use   Smoking status: Never   Smokeless tobacco: Never  Vaping Use   Vaping Use: Never used  Substance and Sexual Activity   Alcohol use: Yes    Alcohol/week: 1.0 standard drink  of alcohol    Types: 1 Glasses of wine per week    Comment: once a month   Drug use: No   Sexual activity: Not Currently  Other Topics Concern   Not on file  Social History Narrative   Right handed   Drinks caffeine   Memory issues started in fall 2023.   One story home   Lives alone   Retired, part time for Therapist, sports   Social Determinants of Health   Financial Resource Strain: Low Risk  (10/25/2022)   Overall Financial Resource Strain (CARDIA)    Difficulty of Paying Living Expenses: Not very hard  Food Insecurity: No Food Insecurity (10/25/2022)   Hunger Vital Sign    Worried About Running Out of Food in the Last Year: Never true    Ran Out of Food in the Last Year: Never true  Transportation Needs: No Transportation Needs (10/25/2022)   PRAPARE - Administrator, Civil Service (Medical): No    Lack of Transportation (Non-Medical): No  Physical Activity: Insufficiently Active (10/25/2022)   Exercise Vital Sign    Days of Exercise per Week: 2 days    Minutes of Exercise per Session: 20 min  Stress: Stress Concern Present (10/25/2022)   [employer] of Occupational Health - Occupational Stress Questionnaire    Feeling of Stress : To some extent  Social Connections: Moderately Isolated (10/25/2022)   Social Connection and Isolation Panel [NHANES]    Frequency of Communication with Friends and Family: Three times a week    Frequency of Social Gatherings with Friends and Family: Once a week    Attends Religious Services: 1 to 4 times per year    Active Member of [employer] or Organizations: No    Attends Banker Meetings: Not on file    Marital Status: Never married  Intimate Partner Violence: Not At Risk (05/12/2021)   Humiliation, Afraid, Rape, and Kick  questionnaire    Fear of Current or Ex-Partner: No    Emotionally Abused: No    Physically Abused: No    Sexually Abused: No     Review of Systems    General:  No chills, fever, night sweats or  positive for purposeful weight loss Cardiovascular:  No chest pain, dyspnea on exertion, edema, orthopnea, palpitations, paroxysmal nocturnal dyspnea. Dermatological: No rash, lesions/masses Respiratory: No cough, dyspnea Urologic: No hematuria, dysuria Abdominal:   No nausea, vomiting, diarrhea, bright red blood per rectum, melena, or hematemesis Neurologic:  No visual changes, wkns, changes in mental status. All other systems reviewed and are otherwise negative except as noted above.     Physical Exam    VS:  BP 124/62   Pulse 84   Ht 5\' 8"  (1.727 m)   Wt 215 lb 9.6 oz (97.8 kg)   SpO2 95%   BMI 32.78 kg/m  , BMI Body mass index is 32.78 kg/m.     GEN: Well nourished, well developed, in no acute distress. HEENT: normal. Neck: Supple, no JVD, carotid bruits, or masses. Cardiac: RRR tachycardic,, no murmurs, rubs, or gallops. No clubbing, cyanosis, edema.  Radials/DP/PT 2+ and equal bilaterally.  Respiratory:  Respirations regular and unlabored, clear to auscultation bilaterally. GI: Soft, nontender, nondistended, BS + x 4. MS: no deformity or atrophy. Skin: warm and dry, no rash. Neuro:  Strength and sensation are intact. Psych: Normal affect.  Accessory Clinical Findings    ECG personally reviewed by me today- Normal sinus rhythm Right bundle branch block HR 91 bpm    Lab Results  Component Value Date   WBC 6.3 09/21/2022   HGB 16.5 09/21/2022   HCT 48.6 09/21/2022   MCV 94.4 09/21/2022   PLT 200.0 09/21/2022   Lab Results  Component Value Date   CREATININE 0.89 09/21/2022   BUN 9 09/21/2022   NA 141 09/21/2022   K 4.7 09/21/2022   CL 106 09/21/2022   CO2 26 09/21/2022   Lab Results  Component Value Date   ALT 25 09/21/2022   AST 22 09/21/2022   ALKPHOS 68 09/21/2022   BILITOT 0.7 09/21/2022   Lab Results  Component Value Date   CHOL 91 (L) 08/13/2022   HDL 35 (L) 08/13/2022   LDLCALC 28 08/13/2022   LDLDIRECT 151.2 09/04/2014   TRIG 168 (H)  08/13/2022   CHOLHDL 2.6 08/13/2022    Lab Results  Component Value Date   HGBA1C 6.2 09/21/2022    Review of Prior Studies: CULPRIT LESION: Prox LAD lesion is 99% stenosed. -Subtotal CTO with TIMI I flow A drug-eluting stent was successfully placed using a STENT SYNERGY DES 2.5X20. (Taper post dilation 2.9-2.7 mm) Post intervention, there is a 0% residual stenosis. The left ventricular systolic function is normal. EF~> 65%. Unable to see apical wall. LV end diastolic pressure is normal.   SUMMARY Severe single-vessel CAD involving proximal to mid LAD 99% subtotal occlusion with TIMI I flow successfully treated with synergy DES 2.5 mm x  20 mm postdilated to 2.9-2.7 mm. Otherwise angiographically normal coronary arteries Normal/hyperdynamic LVEF with normal LVEDP  Echocardiogram 09/14/2018 Left ventricle: The cavity size was normal.with asymetrical    septal hypertrophy 1.9 cm Systolic function was normal. The    estimated ejection fraction was in the range of 60% to 65%. Wall    motion was normal; there were no regional wall motion    abnormalities. Doppler parameters are consistent with abnormal    left ventricular  relaxation (grade 1 diastolic dysfunction).  - Aortic valve: Transvalvular velocity was within the normal range    at rest and with valsalva provocation. There was no stenosis.    Valve area (Vmax): 2.67 cm^2.  - Ascending aorta: The ascending aorta was mildly dilated.   Assessment & Plan   1.  Coronary artery disease: History of PCI of his LAD in 2020, he is denying any cardiac symptoms.  In fact with some weight loss he is felt much better, breathing better, has more energy.  He denies any rapid heart rhythm or palpitations.  Continue secondary management with statin therapy, blood pressure control, continue weight loss and low-sodium diet.  2.  Hyperlipidemia: Patient is on Repatha provided with refills and information on card renewal.  Lipids and LFTs are being  ordered for follow-up.  Goal of LDL less than 70.  He will continue on Zetia and Repatha.  Weight loss is also beneficial.  3.  Hypertension: Hypotensive initially on evaluation in clinic.  I did recheck his blood pressure and was 124/62.  He is advised however to keep track of his blood pressure with weight loss so that he may not have at any episodes of dizziness and near syncope as we may need to adjust medications in the future.  4.  Morbid obesity: The patient is now on a weight loss regimen with Nutrisystem, exercise regimen, and low-sodium diet.  He has lost 16 pounds with a goal weight of 172 pounds.  The patient is encouraged to continue this healthy lifestyle change.  He is given information on salty 6 to assist him with general information on low-sodium.         Signed, Bettey Mare. Liborio Nixon, ANP, AACC   04/12/2023 9:01 AM      Office [phone] Fax [phone]  Notice: This dictation was prepared with Dragon dictation along with smaller phrase technology. Any transcriptional errors that result from this process are unintentional and may not be corrected upon review.

## 2023-04-12 ENCOUNTER — Encounter: Payer: Self-pay | Admitting: Adult Health

## 2023-04-12 ENCOUNTER — Ambulatory Visit: Payer: Medicare Other | Attending: Adult Health | Admitting: Adult Health

## 2023-04-12 VITALS — BP 124/62 | HR 84 | Ht 68.0 in | Wt 215.6 lb

## 2023-04-12 DIAGNOSIS — E7800 Pure hypercholesterolemia, unspecified: Secondary | ICD-10-CM | POA: Diagnosis present

## 2023-04-12 DIAGNOSIS — I25119 Atherosclerotic heart disease of native coronary artery with unspecified angina pectoris: Secondary | ICD-10-CM | POA: Diagnosis present

## 2023-04-12 DIAGNOSIS — I10 Essential (primary) hypertension: Secondary | ICD-10-CM

## 2023-04-12 MED ORDER — VERAPAMIL HCL 80 MG PO TABS: ORAL_TABLET | ORAL | 1 refills | Status: DC

## 2023-04-12 MED ORDER — REPATHA SURECLICK 140 MG/ML ~~LOC~~ SOAJ: SUBCUTANEOUS | 11 refills | Status: DC

## 2023-04-12 MED ORDER — EZETIMIBE 10 MG PO TABS: 10.0000 mg | ORAL_TABLET | Freq: Every day | ORAL | 1 refills | Status: DC

## 2023-04-12 MED ORDER — VERAPAMIL HCL ER 180 MG PO TBCR: 180.0000 mg | EXTENDED_RELEASE_TABLET | Freq: Every day | ORAL | 3 refills | Status: DC

## 2023-04-12 NOTE — Patient Instructions (Signed)
Medication Instructions:  No Changes *If you need a refill on your cardiac medications before your next appointment, please call your pharmacy*   Lab Work: Lipid Panel, CMET If you have labs (blood work) drawn today and your tests are completely normal, you will receive your results only by: MyChart Message (if you have MyChart) OR A paper copy in the mail If you have any lab test that is abnormal or we need to change your treatment, we will call you to review the results.   Testing/Procedures: No Testing   Follow-Up: At [facility], you and your health needs are our priority.  As part of our continuing mission to provide you with exceptional heart care, we have created designated Provider Care Teams.  These Care Teams include your primary Cardiologist (physician) and Advanced Practice Providers (APPs -  Physician Assistants and Nurse Practitioners) who all work together to provide you with the care you need, when you need it.  We recommend signing up for the patient portal called "MyChart".  Sign up information is provided on this After Visit Summary.  MyChart is used to connect with patients for Virtual Visits (Telemedicine).  Patients are able to view lab/test results, encounter notes, upcoming appointments, etc.  Non-urgent messages can be sent to your provider as well.   To learn more about what you can do with MyChart, go to [email or web address].    Your next appointment:   6 month(s)  Provider:   Zoila Shutter, MD

## 2023-04-13 ENCOUNTER — Emergency Department (HOSPITAL_BASED_OUTPATIENT_CLINIC_OR_DEPARTMENT_OTHER)
Admission: EM | Admit: 2023-04-13 | Discharge: 2023-04-13 | Disposition: A | Discharge status: Home / Self Care | Payer: Medicare Other | Attending: Emergency Medicine | Admitting: Emergency Medicine

## 2023-04-13 ENCOUNTER — Other Ambulatory Visit: Payer: Self-pay

## 2023-04-13 ENCOUNTER — Encounter (HOSPITAL_BASED_OUTPATIENT_CLINIC_OR_DEPARTMENT_OTHER): Payer: Self-pay

## 2023-04-13 DIAGNOSIS — R1310 Dysphagia, unspecified: Secondary | ICD-10-CM | POA: Insufficient documentation

## 2023-04-13 DIAGNOSIS — T7840XA Allergy, unspecified, initial encounter: Secondary | ICD-10-CM | POA: Diagnosis present

## 2023-04-13 DIAGNOSIS — Z91040 Latex allergy status: Secondary | ICD-10-CM | POA: Diagnosis not present

## 2023-04-13 DIAGNOSIS — Z7982 Long term (current) use of aspirin: Secondary | ICD-10-CM | POA: Diagnosis not present

## 2023-04-13 MED ORDER — DIPHENHYDRAMINE HCL 50 MG/ML IJ SOLN
25.0000 mg | Freq: Once | INTRAMUSCULAR | Status: AC
  Administered 2023-04-13: 25 mg via INTRAVENOUS
  Filled 2023-04-13: qty 1

## 2023-04-13 MED ORDER — METHYLPREDNISOLONE SODIUM SUCC 125 MG IJ SOLR
125.0000 mg | Freq: Once | INTRAMUSCULAR | Status: AC
  Administered 2023-04-13: 125 mg via INTRAVENOUS
  Filled 2023-04-13: qty 2

## 2023-04-13 MED ORDER — PREDNISONE 10 MG PO TABS: 50.0000 mg | ORAL_TABLET | Freq: Every day | ORAL | 0 refills | Status: DC

## 2023-04-13 MED ORDER — LIDOCAINE VISCOUS HCL 2 % MT SOLN
15.0000 mL | Freq: Once | OROMUCOSAL | Status: DC
  Filled 2023-04-13: qty 15

## 2023-04-13 NOTE — ED Provider Notes (Signed)
[place] EMERGENCY DEPARTMENT AT [facility] Provider Note   CSN: [id number] Arrival date & time: 04/13/23  1710     History  Chief Complaint  Patient presents with   Allergic Reaction    Edward Curtis is a 67 y.o. male.  HPI    67 year old male comes in with chief complaint of allergic reaction.  Patient states that he had a pineapple earlier today, within minutes he started noticing some throat discomfort.  He had another bite, and the symptoms worsen.  He also felt ringing in the left ear and some discomfort there.  He took some Benadryl, Pepcid -and got hold of his EpiPen given he has history of anaphylaxis in the past (not particularly to pineapple), and decided to come to the ER.  Patient denies any shortness of breath, wheezing.   Home Medications Prior to Admission medications   Medication Sig Start Date End Date Taking? Authorizing Provider  predniSONE (DELTASONE) 10 MG tablet Take 5 tablets (50 mg total) by mouth daily. 04/13/23  Yes Derwood Kaplan, MD  aspirin EC 81 MG tablet Take 81 mg by mouth daily.    [provider]  Cyanocobalamin (VITAMIN B 12) 500 MCG TABS Take 500 mcg by mouth daily. 03/25/20   [provider]  diphenhydrAMINE (BENADRYL) 25 MG tablet Take 25 mg by mouth daily as needed (allergic reactions).    [provider]  doxycycline (VIBRAMYCIN) 100 MG capsule Take 1 capsule (100 mg total) by mouth 2 (two) times daily. Patient not taking: Reported on 04/12/2023 10/29/22   [name], MD  EPINEPHrine (EPIPEN 2-PAK) 0.3 mg/0.3 mL IJ SOAJ injection Inject 0.3 mLs (0.3 mg total) into the muscle once as needed (for severe allergic reaction). CAll 911 immediately if you have to use this medicine 05/29/20   [name], MD  Evolocumab [facility] SURECLICK) 140 MG/ML SOAJ INJECT 140MG  INTO THE SKIN EVERY 14 DAYS 04/12/23   Jodelle Gross, NP  ezetimibe (ZETIA) 10 MG tablet Take 1 tablet (10 mg total) by mouth daily. 04/12/23    Jodelle Gross, NP  finasteride (PROSCAR) 5 MG tablet Take 1 tablet (5 mg total) by mouth daily. Patient not taking: Reported on 04/12/2023 11/01/22   [name], MD  HYDROcodone bit-homatropine [facility]) 5-1.5 MG/5ML syrup Take 5 mLs by mouth every 4 (four) hours as needed for cough. Patient not taking: Reported on 04/12/2023 10/12/21   [name], MD  ketoconazole (NIZORAL) 2 % cream Apply topically daily. 02/18/21   [provider]  Melatonin 1 MG CHEW Chew 1 mg by mouth daily. 10/26/19   [provider]  methocarbamol (ROBAXIN) 500 MG tablet Take 1 tablet (500 mg total) by mouth every 6 (six) hours as needed for muscle spasms. 06/04/21   [name], MD  nitroGLYCERIN (NITROSTAT) 0.4 MG SL tablet ONE TABLET UNDER TONGUE EVERY 5 MINUTES AS NEEDED FOR CHEST PAIN 05/25/21   Baldo Daub, MD  Polyethylene Glycol 3350 (MIRALAX PO) Take by mouth.    [provider]  tadalafil (CIALIS) 5 MG tablet Take 1 tablet (5 mg total) by mouth daily as needed for erectile dysfunction. 06/09/21   [name], MD  valACYclovir (VALTREX) 1000 MG tablet Take 1,000 mg by mouth daily as needed. 05/23/21   [provider]  verapamil (CALAN) 80 MG tablet TAKE ONE TABLET DAILY AS NEEDED 04/12/23   Jodelle Gross, NP  verapamil (CALAN-SR) 180 MG CR tablet Take 1 tablet (  180 mg total) by mouth daily. 04/12/23   Jodelle Gross, NP      Allergies    Ciprofloxacin, Iodinated contrast media, Penicillins, Yellow jacket venom, Atorvastatin, Clindamycin/lincomycin, Erythromycin, Ibuprofen, Keflex [cephalexin], Other, Rosuvastatin, Sulfa antibiotics, and Latex    Review of Systems   Review of Systems  All other systems reviewed and are negative.   Physical Exam Updated Vital Signs BP 121/72   Pulse 65   Temp 97.9 F (36.6 C) (Temporal)   Resp 18   Ht 5\' 8"  (1.727 m)   Wt 95.3 kg   SpO2 98%   BMI 31.93 kg/m  Physical Exam Vitals and nursing note reviewed.   Constitutional:      Appearance: He is well-developed.  HENT:     Head: Atraumatic.     Nose: Nose normal.     Mouth/Throat:     Mouth: Mucous membranes are moist.     Pharynx: No oropharyngeal exudate.  Eyes:     Extraocular Movements: Extraocular movements intact.     Pupils: Pupils are equal, round, and reactive to light.  Cardiovascular:     Rate and Rhythm: Normal rate.  Pulmonary:     Effort: Pulmonary effort is normal.     Breath sounds: No stridor. No wheezing.  Musculoskeletal:     Cervical back: Neck supple.  Skin:    General: Skin is warm.  Neurological:     Mental Status: He is alert and oriented to person, place, and time.     ED Results / Procedures / Treatments   Labs (all labs ordered are listed, but only abnormal results are displayed) Labs Reviewed - No data to display  EKG None  Radiology No results found.  Procedures Procedures    Medications Ordered in ED Medications  lidocaine (XYLOCAINE) 2 % viscous mouth solution 15 mL (15 mLs Mouth/Throat Not Given 04/13/23 1904)  methylPREDNISolone sodium succinate (SOLU-MEDROL) 125 mg/2 mL injection 125 mg (125 mg Intravenous Given 04/13/23 1900)  diphenhydrAMINE (BENADRYL) injection 25 mg (25 mg Intravenous Given 04/13/23 1933)    ED Course/ Medical Decision Making/ A&P                             Medical Decision Making Risk Prescription drug management.   67 year old patient comes in with chief complaint of allergic reaction.  He started having sudden itchy sensation to the throat, feeling that his throat was swelling after he had pineapple.  He has no trismus, stridor, wheezing, rash.  Oral exam is reassuring.  I discussed with him that we can proceed with giving him EpiPen given that he feels that the throat is closing, but patient apprehensive.  He therefore will receive Solu-Medrol and close monitoring.  Will reassess him, if he continues to feel well then we will discharge him with return  precautions.  Reassessment: Patient feels better after Solu-Medrol.  He has definitely not worsened.  He feels comfortable going home.  He has EpiPen with him.  Final Clinical Impression(s) / ED Diagnoses Final diagnoses:  Allergic reaction, initial encounter  Dysphagia, unspecified type    Rx / DC Orders ED Discharge Orders          Ordered    predniSONE (DELTASONE) 10 MG tablet  Daily        04/13/23 1927              Derwood Kaplan, MD 04/19/23 [phone]

## 2023-04-13 NOTE — ED Triage Notes (Signed)
Patient here POV from Home.  Endorses eating Pineapple today at 1615. Within a few minutes began to have Ringing in left Ear and Left Sided Throat Discomfort.   25 mg benadryl and Pepcid at 1700.   NAD noted during triage. A&Ox4. GCS 15. Ambulatory.

## 2023-04-13 NOTE — Discharge Instructions (Addendum)
We suspect that most likely you are having allergic reaction causing discomfort and swelling.  Other possibilities are also possible, but at this time seems to be less likely.  Take the prednisone that is prescribed. Take Benadryl twice a day for the next 2 to 3 days.  Take Pepcid at least once daily for the next 2 or 3 days.  Return to the ER if you start having worsening chest pain, difficulty breathing, drooling.

## 2023-04-20 ENCOUNTER — Telehealth: Payer: Self-pay

## 2023-04-20 NOTE — Telephone Encounter (Signed)
Transition Care Management Unsuccessful Follow-up Telephone Call  Date of discharge and from where:  [facility] 6/19  Attempts:  1st Attempt  Reason for unsuccessful TCM follow-up call:  Left voice message   [facility], [place] [phone] [address], [place], [place] [address] Phone: [phone] Email: [email or web address]       

## 2023-04-21 ENCOUNTER — Telehealth: Payer: Self-pay

## 2023-04-21 NOTE — Telephone Encounter (Signed)
Transition Care Management Unsuccessful Follow-up Telephone Call  Date of discharge and from where:  [facility] 6/19 Attempts:  2nd Attempt  Reason for unsuccessful TCM follow-up call:  Left voice message   [facility] Guide, [facility] [phone] [address] [place], [place], [place] [address] Phone: [phone] Email: [email or web address]

## 2023-04-25 ENCOUNTER — Encounter: Payer: Self-pay | Admitting: Internal Medicine

## 2023-04-25 DIAGNOSIS — I422 Other hypertrophic cardiomyopathy: Secondary | ICD-10-CM

## 2023-04-26 ENCOUNTER — Encounter: Payer: Self-pay | Admitting: Internal Medicine

## 2023-05-20 ENCOUNTER — Ambulatory Visit (HOSPITAL_COMMUNITY): Payer: Medicare Other | Attending: Internal Medicine

## 2023-05-20 DIAGNOSIS — I422 Other hypertrophic cardiomyopathy: Secondary | ICD-10-CM | POA: Insufficient documentation

## 2023-05-20 LAB — ECHOCARDIOGRAM COMPLETE
Area-P 1/2: 4.01 cm2
S' Lateral: 2 cm

## 2023-05-20 MED ORDER — PERFLUTREN LIPID MICROSPHERE
1.0000 mL | INTRAVENOUS | Status: AC | PRN
Start: 2023-05-20 — End: 2023-05-20
  Administered 2023-05-20: 2 mL via INTRAVENOUS

## 2023-05-27 ENCOUNTER — Ambulatory Visit: Payer: Medicare Other

## 2023-05-27 VITALS — BP 122/62 | HR 88 | Temp 98.1°F | Ht 68.0 in | Wt 196.4 lb

## 2023-05-27 DIAGNOSIS — Z0000 Encounter for general adult medical examination without abnormal findings: Secondary | ICD-10-CM

## 2023-05-27 NOTE — Progress Notes (Cosign Needed Addendum)
Subjective:   Edward Curtis is a 67 y.o. male who presents for Medicare Annual/Subsequent preventive examination.  Visit Complete: In person  Patient Medicare AWV questionnaire was completed by the patient on 05/23/23; I have confirmed that all information answered by patient is correct and no changes since this date.  Review of Systems     Cardiac Risk Factors include: advanced age (>66men, >68 women);male gender     Objective:    Today's Vitals   05/27/23 0848 05/27/23 0901  BP: 122/62   Pulse: 88   Temp: 98.1 F (36.7 C)   TempSrc: Oral   SpO2: 98%   Weight: 196 lb 6.4 oz (89.1 kg)   Height: 5\' 8"  (1.727 m)   PainSc:  3    Body mass index is 29.86 kg/m.     05/27/2023    9:10 AM 04/13/2023    5:17 PM 11/19/2022    9:54 AM 05/18/2022    8:42 AM 06/18/2021    8:55 PM 06/18/2021   10:14 AM 06/18/2021    2:14 AM  Advanced Directives  Does Patient Have a Medical Advance Directive? No No No No No No No  Would patient like information on creating a medical advance directive? No - Patient declined No - Patient declined   No - Patient declined No - Patient declined No - Patient declined    Current Medications (verified) Outpatient Encounter Medications as of 05/27/2023  Medication Sig   aspirin EC 81 MG tablet Take 81 mg by mouth daily.   Cyanocobalamin (VITAMIN B 12) 500 MCG TABS Take 500 mcg by mouth daily.   diphenhydrAMINE (BENADRYL) 25 MG tablet Take 25 mg by mouth daily as needed (allergic reactions).   doxycycline (VIBRAMYCIN) 100 MG capsule Take 1 capsule (100 mg total) by mouth 2 (two) times daily. (Patient not taking: Reported on 04/12/2023)   EPINEPHrine (EPIPEN 2-PAK) 0.3 mg/0.3 mL IJ SOAJ injection Inject 0.3 mLs (0.3 mg total) into the muscle once as needed (for severe allergic reaction). CAll 911 immediately if you have to use this medicine   Evolocumab (REPATHA SURECLICK) 140 MG/ML SOAJ INJECT 140MG  INTO THE SKIN EVERY 14 DAYS   ezetimibe (ZETIA) 10 MG tablet Take  1 tablet (10 mg total) by mouth daily.   HYDROcodone bit-homatropine (HYCODAN) 5-1.5 MG/5ML syrup Take 5 mLs by mouth every 4 (four) hours as needed for cough. (Patient not taking: Reported on 04/12/2023)   ketoconazole (NIZORAL) 2 % cream Apply topically daily.   Melatonin 1 MG CHEW Chew 1 mg by mouth daily.   methocarbamol (ROBAXIN) 500 MG tablet Take 1 tablet (500 mg total) by mouth every 6 (six) hours as needed for muscle spasms.   nitroGLYCERIN (NITROSTAT) 0.4 MG SL tablet ONE TABLET UNDER TONGUE EVERY 5 MINUTES AS NEEDED FOR CHEST PAIN   Polyethylene Glycol 3350 (MIRALAX PO) Take by mouth.   predniSONE (DELTASONE) 10 MG tablet Take 5 tablets (50 mg total) by mouth daily.   tadalafil (CIALIS) 5 MG tablet Take 1 tablet (5 mg total) by mouth daily as needed for erectile dysfunction.   valACYclovir (VALTREX) 1000 MG tablet Take 1,000 mg by mouth daily as needed.   verapamil (CALAN) 80 MG tablet TAKE ONE TABLET DAILY AS NEEDED   verapamil (CALAN-SR) 180 MG CR tablet Take 1 tablet (180 mg total) by mouth daily.   [DISCONTINUED] finasteride (PROSCAR) 5 MG tablet Take 1 tablet (5 mg total) by mouth daily. (Patient not taking: Reported on 04/12/2023)   No  facility-administered encounter medications on file as of 05/27/2023.    Allergies (verified) Ciprofloxacin, Iodinated contrast media, Penicillins, Yellow jacket venom, Atorvastatin, Clindamycin/lincomycin, Erythromycin, Ibuprofen, Keflex [cephalexin], Other, Rosuvastatin, Sulfa antibiotics, and Latex   History: Past Medical History:  Diagnosis Date   Allergy    seasonal allergies    Arthritis    Asthma    Blood transfusion without reported diagnosis 10/26/1983   Calcium oxalate renal stones    early 1990's   Cataract    Chicken pox    Chronic neck pain    Depression    GERD (gastroesophageal reflux disease)    Hyperlipidemia 10/10/2013   Hypertrophic obstructive cardiomyopathy(425.11) 10/10/2013   IBS (irritable bowel syndrome)  12/07/2011   Migraines    Neuromuscular disorder (HCC)    radiculopathy left side    Obesity (BMI 30-39.9) 10/10/2013   Seasonal allergies    Past Surgical History:  Procedure Laterality Date   CATARACT EXTRACTION Left 10/2020   CHOLECYSTECTOMY N/A 06/19/2021   Procedure: LAPAROSCOPIC CHOLECYSTECTOMY WITH POSSIBLE INTRAOPERATIVE CHOLANGIOGRAM;  Surgeon: Berna Bue, MD;  Location: [facility];  Service: General;  Laterality: N/A;   CORONARY STENT INTERVENTION N/A 03/23/2019   Procedure: CORONARY STENT INTERVENTION;  Surgeon: Marykay Lex, MD;  Location: [facility] INVASIVE CV LAB;  Service: Cardiovascular;  Laterality: N/A;   LEFT HEART CATH AND CORONARY ANGIOGRAPHY N/A 03/23/2019   Procedure: LEFT HEART CATH AND CORONARY ANGIOGRAPHY;  Surgeon: Marykay Lex, MD;  Location: [facility] INVASIVE CV LAB;  Service: Cardiovascular;  Laterality: N/A;   left knee surgery  1990   MOUTH SURGERY  1975/1993   pre cancerous mole  2013   prostate procedure  1980   umbilical cyst  1985   Family History  Problem Relation Age of Onset   Arthritis Mother    Hyperlipidemia Mother    Heart disease Mother    Hypertension Mother    Heart disease Father    Hypertension Father    Diabetes Father    Arthritis Paternal Grandmother    Diabetes Paternal Grandmother    Colon cancer Paternal Grandfather    Heart disease Paternal Grandfather    Diabetes Paternal Grandfather    Hyperlipidemia Maternal Grandmother    Hypertension Other        both sets of parents   Esophageal cancer Neg Hx    Rectal cancer Neg Hx    Stomach cancer Neg Hx    Social History   Socioeconomic History   Marital status: Single    Spouse name: Not on file   Number of children: 0   Years of education: 16   Highest education level: Bachelor's degree (e.g., BA, AB, BS)  Occupational History   Not on file  Tobacco Use   Smoking status: Never   Smokeless tobacco: Never  Vaping Use   Vaping status: Never Used  Substance and Sexual  Activity   Alcohol use: Yes    Alcohol/week: 1.0 standard drink of alcohol    Types: 1 Glasses of wine per week    Comment: once a month   Drug use: No   Sexual activity: Not Currently  Other Topics Concern   Not on file  Social History Narrative   Right handed   Drinks caffeine   Memory issues started in fall 2023.   One story home   Lives alone   Retired, part time for Therapist, sports   Social Determinants of Health   Financial Resource Strain: Low Risk  (05/27/2023)  Overall Financial Resource Strain (CARDIA)    Difficulty of Paying Living Expenses: Not hard at all  Food Insecurity: No Food Insecurity (05/27/2023)   Hunger Vital Sign    Worried About Running Out of Food in the Last Year: Never true    Ran Out of Food in the Last Year: Never true  Transportation Needs: No Transportation Needs (05/27/2023)   PRAPARE - Administrator, Civil Service (Medical): No    Lack of Transportation (Non-Medical): No  Physical Activity: Insufficiently Active (05/27/2023)   Exercise Vital Sign    Days of Exercise per Week: 3 days    Minutes of Exercise per Session: 30 min  Stress: No Stress Concern Present (05/27/2023)   [employer] of Occupational Health - Occupational Stress Questionnaire    Feeling of Stress : Not at all  Social Connections: Moderately Integrated (05/27/2023)   Social Connection and Isolation Panel [NHANES]    Frequency of Communication with Friends and Family: More than three times a week    Frequency of Social Gatherings with Friends and Family: More than three times a week    Attends Religious Services: More than 4 times per year    Active Member of [employer] or Organizations: Yes    Attends Engineer, structural: More than 4 times per year    Marital Status: Never married    Tobacco Counseling Counseling given: Not Answered   Clinical Intake: BMI - recorded: 29.86 Nutritional Status: BMI 25 -29 Overweight Nutritional Risks:  None Diabetes: No  How often do you need to have someone help you when you read instructions, pamphlets, or other written materials from your doctor or pharmacy?: 1 - Never  Interpreter Needed?: No  Information entered by :: Theresa Mulligan LPN   Activities of Daily Living    05/27/2023    9:08 AM 05/23/2023    2:10 PM  In your present state of health, do you have any difficulty performing the following activities:  Hearing? 0 0  Vision? 0 0  Difficulty concentrating or making decisions? 0 0  Walking or climbing stairs? 0 0  Dressing or bathing? 0 0  Doing errands, shopping? 0 0  Preparing Food and eating ? N N  Using the Toilet? N N  In the past six months, have you accidently leaked urine? N N  Do you have problems with loss of bowel control? N N  Managing your Medications? N N  Managing your Finances? N N  Housekeeping or managing your Housekeeping? N N    Patient Care Team: [name], MD as PCP - General (Family Medicine) Baldo Daub, MD as PCP - Cardiology (Cardiology) Othella Boyer, MD as Consulting Physician (Cardiology) Aris Lot, MD as Consulting Physician (Dermatology)  Indicate any recent Medical Services you may have received from other than [facility] providers in the past year (date may be approximate).     Assessment:   This is a routine wellness examination for Izael.  Hearing/Vision screen Hearing Screening - Comments:: Denies hearing difficulties   Vision Screening - Comments:: Wears rx glasses - up to date with routine eye exams with  [facility]  Dietary issues and exercise activities discussed:     Goals Addressed               This Visit's Progress     Increase physical activity (pt-stated)        Continue losing weight and walk more.  Depression Screen    05/27/2023    8:55 AM 10/26/2022    3:49 PM 09/21/2022    9:46 AM 05/18/2022    8:43 AM 05/07/2022    2:08 PM 12/01/2021    1:59 PM 06/04/2021    9:08 AM   PHQ 2/9 Scores  PHQ - 2 Score 0 1 1 0 0 1 1  PHQ- 9 Score  1 1  1  1     Fall Risk    05/27/2023    9:09 AM 05/23/2023    2:10 PM 11/19/2022    9:55 AM 10/26/2022    3:49 PM 10/25/2022    4:54 PM  Fall Risk   Falls in the past year? 0 0 0 0 0  Number falls in past yr: 0 0 0 0   Injury with Fall? 0 0 0 0   Risk for fall due to : No Fall Risks   No Fall Risks   Follow up Falls prevention discussed  Falls evaluation completed Falls evaluation completed     MEDICARE RISK AT HOME:  Medicare Risk at Home - 05/27/23 0911     Any stairs in or around the home? No    If so, are there any without handrails? No    Home free of loose throw rugs in walkways, pet beds, electrical cords, etc? Yes    Adequate lighting in your home to reduce risk of falls? Yes    Life alert? No    Use of a cane, walker or w/c? No    Grab bars in the bathroom? No    Shower chair or bench in shower? No    Elevated toilet seat or a handicapped toilet? No             TIMED UP AND GO:  Was the test performed?  Yes  Length of time to ambulate 10 feet: 10 sec Gait steady and fast without use of assistive device    Cognitive Function:      11/19/2022   12:00 PM  Montreal Cognitive Assessment   Visuospatial/ Executive (0/5) 5  Naming (0/3) 3  Attention: Read list of digits (0/2) 2  Attention: Read list of letters (0/1) 1  Attention: Serial 7 subtraction starting at 100 (0/3) 3  Language: Repeat phrase (0/2) 2  Language : Fluency (0/1) 1  Abstraction (0/2) 2  Delayed Recall (0/5) 5  Orientation (0/6) 6  Total 30  Adjusted Score (based on education) 30      05/27/2023    9:10 AM 05/18/2022    8:45 AM 05/12/2021   10:04 AM  6CIT Screen  What Year? 0 points 0 points 0 points  What month? 0 points 0 points 0 points  What time? 0 points 0 points 0 points  Count back from 20 0 points 0 points 0 points  Months in reverse 0 points 0 points 0 points  Repeat phrase 0 points 0 points 0 points  Total Score 0  points 0 points 0 points    Immunizations Immunization History  Administered Date(s) Administered   Fluad Quad(high Dose 65+) 08/06/2022   Influenza, High Dose Seasonal PF 08/25/2021   Influenza,inj,Quad PF,6+ Mos 07/03/2014, 09/29/2015   Moderna Covid-19 Vaccine Bivalent Booster 10yrs & up 08/13/2021   Moderna SARS-COV2 Booster Vaccination 03/19/2021   Moderna Sars-Covid-2 Vaccination 02/01/2020, 02/29/2020, 10/03/2020   PNEUMOCOCCAL CONJUGATE-20 07/12/2022   Pneumococcal Conjugate-13 06/04/2021   Tdap 12/27/2013   Zoster Recombinant(Shingrix) 03/05/2022, 07/30/2022    TDAP  status: Up to date  Flu Vaccine status: Due, Education has been provided regarding the importance of this vaccine. Advised may receive this vaccine at local pharmacy or Health Dept. Aware to provide a copy of the vaccination record if obtained from local pharmacy or Health Dept. Verbalized acceptance and understanding.  Pneumococcal vaccine status: Up to date  Covid-19 vaccine status: Completed vaccines  Qualifies for Shingles Vaccine? Yes   Zostavax completed Yes   Shingrix Completed?: Yes  Screening Tests Health Maintenance  Topic Date Due   INFLUENZA VACCINE  05/26/2023   COVID-19 Vaccine (5 - 2023-24 season) 08/24/2024 (Originally 06/25/2022)   Colonoscopy  11/12/2033 (Originally 01/23/2000)   DTaP/Tdap/Td (2 - Td or Tdap) 12/28/2023   Medicare Annual Wellness (AWV)  05/26/2024   Pneumonia Vaccine 49+ Years old  Completed   Hepatitis C Screening  Completed   Zoster Vaccines- Shingrix  Completed   HPV VACCINES  Aged Out    Health Maintenance  Health Maintenance Due  Topic Date Due   INFLUENZA VACCINE  05/26/2023    Colorectal cancer screening: Type of screening: Colonoscopy. Completed 11/12/13. Repeat every 10 years  Lung Cancer Screening: (Low Dose CT Chest recommended if Age 30-80 years, 20 pack-year currently smoking OR have quit w/in 15years.) does not qualify.     Additional  Screening:  Hepatitis C Screening: does qualify; Completed 09/29/15  Vision Screening: Recommended annual ophthalmology exams for early detection of glaucoma and other disorders of the eye. Is the patient up to date with their annual eye exam?  Yes  Who is the provider or what is the name of the office in which the patient attends annual eye exams? [facility] If pt is not established with a provider, would they like to be referred to a provider to establish care? No .   Dental Screening: Recommended annual dental exams for proper oral hygiene    Community Resource Referral / Chronic Care Management:  CRR required this visit?  No   CCM required this visit?  No     Plan:     I have personally reviewed and noted the following in the patient's chart:   Medical and social history Use of alcohol, tobacco or illicit drugs  Current medications and supplements including opioid prescriptions.Patient not currently taking Opioids In addition, I have reviewed and discussed with patient certain preventive protocols, quality metrics, and best practice recommendations. A written personalized care plan for preventive services as well as general preventive health recommendations were provided to patient.     Tillie Rung, LPN   02/24/8412   After Visit Summary: Given  Nurse Notes: None

## 2023-05-27 NOTE — Patient Instructions (Addendum)
Edward Curtis , Thank you for taking time to come for your Medicare Wellness Visit. I appreciate your ongoing commitment to your health goals. Please review the following plan we discussed and let me know if I can assist you in the future.   Referrals/Orders/Follow-Ups/Clinician Recommendations:   This is a list of the screening recommended for you and due dates:  Health Maintenance  Topic Date Due   Flu Shot  05/26/2023   COVID-19 Vaccine (5 - 2023-24 season) 08/24/2024*   Colon Cancer Screening  11/12/2033*   DTaP/Tdap/Td vaccine (2 - Td or Tdap) 12/28/2023   Medicare Annual Wellness Visit  05/26/2024   Pneumonia Vaccine  Completed   Hepatitis C Screening  Completed   Zoster (Shingles) Vaccine  Completed   HPV Vaccine  Aged Out  *Topic was postponed. The date shown is not the original due date.    Advanced directives: (Declined) Advance directive discussed with you today. Even though you declined this today, please call our office should you change your mind, and we can give you the proper paperwork for you to fill out.  Next Medicare Annual Wellness Visit scheduled for next year: Yes  Preventive Care 85 Years and Older, Male  Preventive care refers to lifestyle choices and visits with your health care provider that can promote health and wellness. What does preventive care include? A yearly physical exam. This is also called an annual well check. Dental exams once or twice a year. Routine eye exams. Ask your health care provider how often you should have your eyes checked. Personal lifestyle choices, including: Daily care of your teeth and gums. Regular physical activity. Eating a healthy diet. Avoiding tobacco and drug use. Limiting alcohol use. Practicing safe sex. Taking low doses of aspirin every day. Taking vitamin and mineral supplements as recommended by your health care provider. What happens during an annual well check? The services and screenings done by your health  care provider during your annual well check will depend on your age, overall health, lifestyle risk factors, and family history of disease. Counseling  Your health care provider may ask you questions about your: Alcohol use. Tobacco use. Drug use. Emotional well-being. Home and relationship well-being. Sexual activity. Eating habits. History of falls. Memory and ability to understand (cognition). Work and work Astronomer. Screening  You may have the following tests or measurements: Height, weight, and BMI. Blood pressure. Lipid and cholesterol levels. These may be checked every 5 years, or more frequently if you are over 81 years old. Skin check. Lung cancer screening. You may have this screening every year starting at age 6 if you have a 30-pack-year history of smoking and currently smoke or have quit within the past 15 years. Fecal occult blood test (FOBT) of the stool. You may have this test every year starting at age 90. Flexible sigmoidoscopy or colonoscopy. You may have a sigmoidoscopy every 5 years or a colonoscopy every 10 years starting at age 51. Prostate cancer screening. Recommendations will vary depending on your family history and other risks. Hepatitis C blood test. Hepatitis B blood test. Sexually transmitted disease (STD) testing. Diabetes screening. This is done by checking your blood sugar (glucose) after you have not eaten for a while (fasting). You may have this done every 1-3 years. Abdominal aortic aneurysm (AAA) screening. You may need this if you are a current or former smoker. Osteoporosis. You may be screened starting at age 63 if you are at high risk. Talk with your health care  provider about your test results, treatment options, and if necessary, the need for more tests. Vaccines  Your health care provider may recommend certain vaccines, such as: Influenza vaccine. This is recommended every year. Tetanus, diphtheria, and acellular pertussis (Tdap, Td)  vaccine. You may need a Td booster every 10 years. Zoster vaccine. You may need this after age 47. Pneumococcal 13-valent conjugate (PCV13) vaccine. One dose is recommended after age 46. Pneumococcal polysaccharide (PPSV23) vaccine. One dose is recommended after age 59. Talk to your health care provider about which screenings and vaccines you need and how often you need them. This information is not intended to replace advice given to you by your health care provider. Make sure you discuss any questions you have with your health care provider. Document Released: 11/07/2015 Document Revised: 06/30/2016 Document Reviewed: 08/12/2015 Elsevier Interactive Patient Education  2017 [employer].  Fall Prevention in the Home Falls can cause injuries. They can happen to people of all ages. There are many things you can do to make your home safe and to help prevent falls. What can I do on the outside of my home? Regularly fix the edges of walkways and driveways and fix any cracks. Remove anything that might make you trip as you walk through a door, such as a raised step or threshold. Trim any bushes or trees on the path to your home. Use bright outdoor lighting. Clear any walking paths of anything that might make someone trip, such as rocks or tools. Regularly check to see if handrails are loose or broken. Make sure that both sides of any steps have handrails. Any raised decks and porches should have guardrails on the edges. Have any leaves, snow, or ice cleared regularly. Use sand or salt on walking paths during winter. Clean up any spills in your garage right away. This includes oil or grease spills. What can I do in the bathroom? Use night lights. Install grab bars by the toilet and in the tub and shower. Do not use towel bars as grab bars. Use non-skid mats or decals in the tub or shower. If you need to sit down in the shower, use a plastic, non-slip stool. Keep the floor dry. Clean up any  water that spills on the floor as soon as it happens. Remove soap buildup in the tub or shower regularly. Attach bath mats securely with double-sided non-slip rug tape. Do not have throw rugs and other things on the floor that can make you trip. What can I do in the bedroom? Use night lights. Make sure that you have a light by your bed that is easy to reach. Do not use any sheets or blankets that are too big for your bed. They should not hang down onto the floor. Have a firm chair that has side arms. You can use this for support while you get dressed. Do not have throw rugs and other things on the floor that can make you trip. What can I do in the kitchen? Clean up any spills right away. Avoid walking on wet floors. Keep items that you use a lot in easy-to-reach places. If you need to reach something above you, use a strong step stool that has a grab bar. Keep electrical cords out of the way. Do not use floor polish or wax that makes floors slippery. If you must use wax, use non-skid floor wax. Do not have throw rugs and other things on the floor that can make you trip. What can I  do with my stairs? Do not leave any items on the stairs. Make sure that there are handrails on both sides of the stairs and use them. Fix handrails that are broken or loose. Make sure that handrails are as long as the stairways. Check any carpeting to make sure that it is firmly attached to the stairs. Fix any carpet that is loose or worn. Avoid having throw rugs at the top or bottom of the stairs. If you do have throw rugs, attach them to the floor with carpet tape. Make sure that you have a light switch at the top of the stairs and the bottom of the stairs. If you do not have them, ask someone to add them for you. What else can I do to help prevent falls? Wear shoes that: Do not have high heels. Have rubber bottoms. Are comfortable and fit you well. Are closed at the toe. Do not wear sandals. If you use a  stepladder: Make sure that it is fully opened. Do not climb a closed stepladder. Make sure that both sides of the stepladder are locked into place. Ask someone to hold it for you, if possible. Clearly mark and make sure that you can see: Any grab bars or handrails. First and last steps. Where the edge of each step is. Use tools that help you move around (mobility aids) if they are needed. These include: Canes. Walkers. Scooters. Crutches. Turn on the lights when you go into a dark area. Replace any light bulbs as soon as they burn out. Set up your furniture so you have a clear path. Avoid moving your furniture around. If any of your floors are uneven, fix them. If there are any pets around you, be aware of where they are. Review your medicines with your doctor. Some medicines can make you feel dizzy. This can increase your chance of falling. Ask your doctor what other things that you can do to help prevent falls. This information is not intended to replace advice given to you by your health care provider. Make sure you discuss any questions you have with your health care provider. Document Released: 08/07/2009 Document Revised: 03/18/2016 Document Reviewed: 11/15/2014 Elsevier Interactive Patient Education  2017 [employer].

## 2023-05-28 ENCOUNTER — Encounter: Payer: Self-pay | Admitting: Family Medicine

## 2023-05-31 NOTE — Telephone Encounter (Signed)
I would let Theresa Mulligan handle this

## 2023-06-01 ENCOUNTER — Ambulatory Visit (HOSPITAL_BASED_OUTPATIENT_CLINIC_OR_DEPARTMENT_OTHER): Payer: Medicare Other | Admitting: Nurse Practitioner

## 2023-07-01 ENCOUNTER — Ambulatory Visit: Payer: Medicare Other | Admitting: Family Medicine

## 2023-07-29 ENCOUNTER — Ambulatory Visit (INDEPENDENT_AMBULATORY_CARE_PROVIDER_SITE_OTHER): Payer: Medicare Other

## 2023-07-29 DIAGNOSIS — Z23 Encounter for immunization: Secondary | ICD-10-CM | POA: Diagnosis not present

## 2023-08-17 ENCOUNTER — Institutional Professional Consult (permissible substitution): Payer: Medicare Other | Admitting: Psychology

## 2023-08-17 ENCOUNTER — Ambulatory Visit: Payer: Self-pay

## 2023-08-24 ENCOUNTER — Ambulatory Visit (INDEPENDENT_AMBULATORY_CARE_PROVIDER_SITE_OTHER): Payer: Medicare Other | Admitting: Family Medicine

## 2023-08-24 ENCOUNTER — Encounter: Payer: Self-pay | Admitting: Family Medicine

## 2023-08-24 VITALS — BP 106/62 | HR 78 | Temp 98.3°F | Wt 198.0 lb

## 2023-08-24 DIAGNOSIS — E785 Hyperlipidemia, unspecified: Secondary | ICD-10-CM

## 2023-08-24 DIAGNOSIS — L659 Nonscarring hair loss, unspecified: Secondary | ICD-10-CM | POA: Diagnosis not present

## 2023-08-24 DIAGNOSIS — R739 Hyperglycemia, unspecified: Secondary | ICD-10-CM | POA: Diagnosis not present

## 2023-08-24 DIAGNOSIS — R7989 Other specified abnormal findings of blood chemistry: Secondary | ICD-10-CM

## 2023-08-24 DIAGNOSIS — G629 Polyneuropathy, unspecified: Secondary | ICD-10-CM

## 2023-08-24 NOTE — Progress Notes (Signed)
Subjective:    Patient ID: Edward Curtis, male    DOB: [birth year], 67 y.o.   MRN: [id number]  HPI Here for several issues. First he has been having numbness and tingling in both feet and all toes for the past 6 months. Then several nights ago he developed sharp shooting pains in the left great toe. These resolved by the next morning. The toe was never swollen or warm or red. Of note he saw [place] for neuropathy in his feet in January, and lab revealed a borderline low B12 level of 145. I intended to start him on B12 shots, but instead he began to take 1000 mcg B12 pills OTC daily. He says this made a big improvement and the tingling went away. Unfortunately he stopped taking the B12 pills after 2 months. The other issue is losing a lot of his hair in the last few months. No change in diet. He wonders if the two issues are related.    Review of Systems  Constitutional: Negative.   Respiratory: Negative.    Cardiovascular: Negative.   Neurological:  Positive for numbness.       Objective:   Physical Exam Constitutional:      Appearance: Normal appearance. He is not ill-appearing.  Cardiovascular:     Rate and Rhythm: Normal rate and regular rhythm.     Pulses: Normal pulses.     Heart sounds: Normal heart sounds.  Pulmonary:     Effort: Pulmonary effort is normal.     Breath sounds: Normal breath sounds.  Musculoskeletal:     Comments: Both feet are normal on exam, skin is warm and pink, DP and PT pulses are full. The left great toe is not tender   Neurological:     Mental Status: He is alert.           Assessment & Plan:  His neuropathy has returned, and I am certain this is due to a low B12 level again. We will check this today along with an A1c, TSH, etc. I am not sure if the hair loss is related to this or not.  Gershon Crane, MD

## 2023-08-25 LAB — CBC WITH DIFFERENTIAL/PLATELET
Basophils Absolute: 0.1 10*3/uL (ref 0.0–0.1)
Basophils Relative: 0.9 % (ref 0.0–3.0)
Eosinophils Absolute: 0.2 10*3/uL (ref 0.0–0.7)
Eosinophils Relative: 2.9 % (ref 0.0–5.0)
HCT: 44.3 % (ref 39.0–52.0)
Hemoglobin: 14.3 g/dL (ref 13.0–17.0)
Lymphocytes Relative: 24 % (ref 12.0–46.0)
Lymphs Abs: 1.8 10*3/uL (ref 0.7–4.0)
MCHC: 32.4 g/dL (ref 30.0–36.0)
MCV: 95.5 fL (ref 78.0–100.0)
Monocytes Absolute: 0.7 10*3/uL (ref 0.1–1.0)
Monocytes Relative: 9.4 % (ref 3.0–12.0)
Neutro Abs: 4.8 10*3/uL (ref 1.4–7.7)
Neutrophils Relative %: 62.8 % (ref 43.0–77.0)
Platelets: 195 10*3/uL (ref 150.0–400.0)
RBC: 4.63 Mil/uL (ref 4.22–5.81)
RDW: 13.3 % (ref 11.5–15.5)
WBC: 7.6 10*3/uL (ref 4.0–10.5)

## 2023-08-25 LAB — BASIC METABOLIC PANEL
BUN: 13 mg/dL (ref 6–23)
CO2: 24 meq/L (ref 19–32)
Calcium: 9.1 mg/dL (ref 8.4–10.5)
Chloride: 109 meq/L (ref 96–112)
Creatinine, Ser: 0.93 mg/dL (ref 0.40–1.50)
GFR: 84.33 mL/min (ref >60.00)
Glucose, Bld: 87 mg/dL (ref 70–99)
Potassium: 3.9 meq/L (ref 3.5–5.1)
Sodium: 143 meq/L (ref 135–145)

## 2023-08-25 LAB — HEMOGLOBIN A1C: Hgb A1c MFr Bld: 5.2 % (ref 4.6–6.5)

## 2023-08-25 LAB — TSH: TSH: 4.32 u[IU]/mL (ref 0.35–5.50)

## 2023-08-25 LAB — VITAMIN B12: Vitamin B-12: 196 pg/mL — ABNORMAL LOW (ref 211–911)

## 2023-08-29 ENCOUNTER — Ambulatory Visit: Payer: Medicare Other | Admitting: Physician Assistant

## 2023-09-02 ENCOUNTER — Encounter: Payer: Self-pay | Admitting: Internal Medicine

## 2023-09-13 ENCOUNTER — Ambulatory Visit (INDEPENDENT_AMBULATORY_CARE_PROVIDER_SITE_OTHER): Payer: Medicare Other | Admitting: Family Medicine

## 2023-09-13 ENCOUNTER — Encounter: Payer: Self-pay | Admitting: Family Medicine

## 2023-09-13 VITALS — BP 120/80 | HR 91 | Temp 98.2°F | Wt 199.0 lb

## 2023-09-13 DIAGNOSIS — B001 Herpesviral vesicular dermatitis: Secondary | ICD-10-CM | POA: Diagnosis not present

## 2023-09-13 DIAGNOSIS — J029 Acute pharyngitis, unspecified: Secondary | ICD-10-CM

## 2023-09-13 DIAGNOSIS — J020 Streptococcal pharyngitis: Secondary | ICD-10-CM

## 2023-09-13 LAB — POCT RAPID STREP A (OFFICE): Rapid Strep A Screen: POSITIVE — AB

## 2023-09-13 LAB — POC COVID19 BINAXNOW: SARS Coronavirus 2 Ag: NEGATIVE

## 2023-09-13 MED ORDER — AZITHROMYCIN 250 MG PO TABS: ORAL_TABLET | ORAL | 0 refills | Status: DC

## 2023-09-13 NOTE — Progress Notes (Signed)
   Subjective:    Patient ID: Edward Curtis, male    DOB: [birth year], 67 y.o.   MRN: [id number]  HPI Here for 2 issues. First he developed a painful area on the top of his mouth about a week ago. Then 5 days ago he began to have a left sided ST, swollen left sided lymph glands, and left ear pain. No fever or cough.    Review of Systems  Constitutional: Negative.   HENT:  Positive for ear pain, mouth sores and sore throat. Negative for congestion, postnasal drip and sinus pain.   Eyes: Negative.   Respiratory: Negative.         Objective:   Physical Exam Constitutional:      Appearance: Normal appearance. He is not ill-appearing.  HENT:     Right Ear: Tympanic membrane, ear canal and external ear normal.     Left Ear: Tympanic membrane, ear canal and external ear normal.     Nose: Nose normal.     Mouth/Throat:     Pharynx: No oropharyngeal exudate.     Comments: The left side of the OP is red without exudate. There is also a small area on the left hard palate just behind the teeth which has red vesicles  Eyes:     Conjunctiva/sclera: Conjunctivae normal.  Pulmonary:     Effort: Pulmonary effort is normal.     Breath sounds: Normal breath sounds.  Lymphadenopathy:     Cervical: No cervical adenopathy.  Neurological:     Mental Status: He is alert.           Assessment & Plan:  He has a strep pharyngitis, and he has a herpes outbreak in the mouth. He already has a supply of Valacyclovir at home, so he can take this BID for 5 days. We will treat the strep with a Zpack.  Gershon Crane, MD

## 2023-09-19 ENCOUNTER — Encounter: Payer: Self-pay | Admitting: Family Medicine

## 2023-09-19 ENCOUNTER — Ambulatory Visit (INDEPENDENT_AMBULATORY_CARE_PROVIDER_SITE_OTHER): Payer: Medicare Other | Admitting: Family Medicine

## 2023-09-19 VITALS — BP 128/78 | HR 79 | Temp 98.6°F | Wt 197.0 lb

## 2023-09-19 DIAGNOSIS — J020 Streptococcal pharyngitis: Secondary | ICD-10-CM

## 2023-09-19 DIAGNOSIS — J029 Acute pharyngitis, unspecified: Secondary | ICD-10-CM

## 2023-09-19 DIAGNOSIS — Z114 Encounter for screening for human immunodeficiency virus [HIV]: Secondary | ICD-10-CM

## 2023-09-19 DIAGNOSIS — Z209 Contact with and (suspected) exposure to unspecified communicable disease: Secondary | ICD-10-CM | POA: Diagnosis not present

## 2023-09-19 DIAGNOSIS — Z7251 High risk heterosexual behavior: Secondary | ICD-10-CM | POA: Diagnosis not present

## 2023-09-19 LAB — POCT RAPID STREP A (OFFICE): Rapid Strep A Screen: POSITIVE — AB

## 2023-09-19 MED ORDER — DOXYCYCLINE HYCLATE 100 MG PO TABS: 100.0000 mg | ORAL_TABLET | Freq: Two times a day (BID) | ORAL | 0 refills | Status: DC

## 2023-09-19 NOTE — Progress Notes (Signed)
   Subjective:    Patient ID: Edward Curtis, male    DOB: January 05, 1956, 67 y.o.   MRN: [id number]  HPI Here for several issues. First he was seen here on 09-13-23 for a strep pharyngitis, and he was treated with a Zpack (he is allergic to PCN and cephalosporins). He felt somewhat better after that, but his symptoms persist . These include a ST and soreness of the roof of his mouth on the left side. His left AC nodes are swollen as well. No fever. The other issue is he asks to be checked for STD's. He has no symptoms, but a month ago he had unprotected sex and he wants to make sure he is safe.    Review of Systems  Constitutional: Negative.   HENT:  Positive for sore throat. Negative for congestion, ear pain, postnasal drip, sinus pressure and trouble swallowing.   Eyes: Negative.   Respiratory: Negative.    Genitourinary: Negative.        Objective:   Physical Exam Constitutional:      Appearance: Normal appearance. He is not ill-appearing.  HENT:     Right Ear: Tympanic membrane, ear canal and external ear normal.     Left Ear: Tympanic membrane, ear canal and external ear normal.     Nose: Nose normal.     Mouth/Throat:     Pharynx: Oropharynx is clear. No oropharyngeal exudate or posterior oropharyngeal erythema.  Eyes:     Conjunctiva/sclera: Conjunctivae normal.  Pulmonary:     Effort: Pulmonary effort is normal.     Breath sounds: Normal breath sounds.  Lymphadenopathy:     Cervical: No cervical adenopathy.  Neurological:     Mental Status: He is alert.           Assessment & Plan:  He has a partially treated strep pharyngitis. We will give him 10 days of Doxycycline. We will also screen for STD's per the discussion above.  Gershon Crane, MD

## 2023-09-21 ENCOUNTER — Telehealth: Payer: Self-pay | Admitting: Family Medicine

## 2023-09-21 MED ORDER — NYSTATIN 100000 UNIT/ML MT SUSP: 5.0000 mL | Freq: Four times a day (QID) | OROMUCOSAL | 5 refills | Status: DC

## 2023-09-21 MED ORDER — METHYLPREDNISOLONE 4 MG PO TBPK: ORAL_TABLET | ORAL | 0 refills | Status: DC

## 2023-09-21 NOTE — Telephone Encounter (Signed)
Stay on the Doxycycline but I will also send in a Prednisone pack to decrease the inflammation

## 2023-09-21 NOTE — Addendum Note (Signed)
Addended by: Gershon Crane A on: 09/21/2023 01:07 PM   Modules accepted: Orders

## 2023-09-21 NOTE — Telephone Encounter (Signed)
Pt says his symptoms from when he was seen 09/19/23 have worsened. Asking does he need additional meds or an OV

## 2023-09-21 NOTE — Telephone Encounter (Signed)
I sent in Nystatin oral suspension for the thrush

## 2023-09-21 NOTE — Telephone Encounter (Signed)
Spoke with pt stated that his tongue is getting itchy with white patches possible yeast infection.Pt wants antifungal mouth wash. Please advise

## 2023-09-21 NOTE — Telephone Encounter (Signed)
Pt notifed.

## 2023-09-21 NOTE — Addendum Note (Signed)
Addended by: Gershon Crane A on: 09/21/2023 12:51 PM   Modules accepted: Orders

## 2023-09-25 ENCOUNTER — Encounter: Payer: Self-pay | Admitting: Family Medicine

## 2023-09-26 ENCOUNTER — Ambulatory Visit (INDEPENDENT_AMBULATORY_CARE_PROVIDER_SITE_OTHER): Payer: Medicare Other | Admitting: Family Medicine

## 2023-09-26 ENCOUNTER — Encounter: Payer: Self-pay | Admitting: Family Medicine

## 2023-09-26 ENCOUNTER — Other Ambulatory Visit: Payer: Self-pay | Admitting: Emergency Medicine

## 2023-09-26 VITALS — BP 110/74 | HR 82 | Temp 98.4°F | Wt 194.0 lb

## 2023-09-26 DIAGNOSIS — J029 Acute pharyngitis, unspecified: Secondary | ICD-10-CM

## 2023-09-26 LAB — POCT RAPID STREP A (OFFICE): Rapid Strep A Screen: NEGATIVE

## 2023-09-26 MED ORDER — FLUCONAZOLE 150 MG PO TABS: 150.0000 mg | ORAL_TABLET | Freq: Every day | ORAL | 0 refills | Status: DC

## 2023-09-26 NOTE — Progress Notes (Signed)
   Subjective:    Patient ID: Edward Curtis, male    DOB: [birth year], 67 y.o.   MRN: [id number]  HPI Here to follow up on a strep pharyngitis that was discovered here on 09-13-23. We could not use the common antibiotics for this infection due to his medication allergies, so he was given a Zpack. He was seen again on 09-19-23 for the same symptoms, and he was treated with Doxycycline. He now feels the same as he did before. He has a ST and soreness in the roof of his mouth. No fever. A rapid strep test today is negative.    Review of Systems  Constitutional: Negative.   HENT:  Positive for sore throat. Negative for congestion, ear pain, postnasal drip, sinus pain and trouble swallowing.   Eyes: Negative.   Respiratory: Negative.         Objective:   Physical Exam Constitutional:      Appearance: Normal appearance. He is not ill-appearing.  HENT:     Right Ear: Tympanic membrane, ear canal and external ear normal.     Left Ear: Tympanic membrane, ear canal and external ear normal.     Nose: Nose normal.     Mouth/Throat:     Pharynx: Oropharynx is clear. Posterior oropharyngeal erythema present. No oropharyngeal exudate.  Eyes:     Conjunctiva/sclera: Conjunctivae normal.  Neck:     Comments: He is tender in there left anterior cervical area Pulmonary:     Effort: Pulmonary effort is normal.     Breath sounds: Normal breath sounds.  Neurological:     Mental Status: He is alert.           Assessment & Plan:  Pharyngitis. He will finish up the Doxycycline. We will add 10 days of Fluconazole to cover for any fungla infections. A throat culture is pending. We will refer him to ENT as well.  Gershon Crane, MD

## 2023-09-26 NOTE — Telephone Encounter (Signed)
We discussed this at his OV today

## 2023-09-26 NOTE — Addendum Note (Signed)
Addended by: Carola Rhine on: 09/26/2023 03:26 PM   Modules accepted: Orders

## 2023-09-26 NOTE — Addendum Note (Signed)
Addended by: Carola Rhine on: 09/26/2023 03:43 PM   Modules accepted: Orders

## 2023-09-27 ENCOUNTER — Other Ambulatory Visit: Payer: Self-pay | Admitting: Family Medicine

## 2023-09-28 ENCOUNTER — Encounter: Payer: Self-pay | Admitting: Family Medicine

## 2023-09-28 LAB — CULTURE, GROUP A STREP
Micro Number: 15796602
SPECIMEN QUALITY:: ADEQUATE

## 2023-09-28 LAB — PREP: HIV AG/AB WITH REFLEX: HIV Screen 4th Generation wRfx: NONREACTIVE

## 2023-09-28 LAB — HM HIV SCREENING LAB: HM HIV Screening: NEGATIVE

## 2023-09-30 ENCOUNTER — Ambulatory Visit (INDEPENDENT_AMBULATORY_CARE_PROVIDER_SITE_OTHER): Payer: Medicare Other | Admitting: Family Medicine

## 2023-09-30 ENCOUNTER — Encounter: Payer: Self-pay | Admitting: Family Medicine

## 2023-09-30 VITALS — BP 110/78 | HR 87 | Temp 98.7°F | Wt 193.4 lb

## 2023-09-30 DIAGNOSIS — E538 Deficiency of other specified B group vitamins: Secondary | ICD-10-CM

## 2023-09-30 DIAGNOSIS — G629 Polyneuropathy, unspecified: Secondary | ICD-10-CM | POA: Diagnosis not present

## 2023-09-30 LAB — CBC WITH DIFFERENTIAL/PLATELET
Basophils Absolute: 0.1 10*3/uL (ref 0.0–0.1)
Basophils Relative: 0.5 % (ref 0.0–3.0)
Eosinophils Absolute: 0.1 10*3/uL (ref 0.0–0.7)
Eosinophils Relative: 0.8 % (ref 0.0–5.0)
HCT: 49 % (ref 39.0–52.0)
Hemoglobin: 17.1 g/dL — ABNORMAL HIGH (ref 13.0–17.0)
Lymphocytes Relative: 19.5 % (ref 12.0–46.0)
Lymphs Abs: 2.2 10*3/uL (ref 0.7–4.0)
MCHC: 34.8 g/dL (ref 30.0–36.0)
MCV: 95.8 fL (ref 78.0–100.0)
Monocytes Absolute: 0.9 10*3/uL (ref 0.1–1.0)
Monocytes Relative: 7.7 % (ref 3.0–12.0)
Neutro Abs: 8 10*3/uL — ABNORMAL HIGH (ref 1.4–7.7)
Neutrophils Relative %: 71.5 % (ref 43.0–77.0)
Platelets: 209 10*3/uL (ref 150.0–400.0)
RBC: 5.11 Mil/uL (ref 4.22–5.81)
RDW: 13.6 % (ref 11.5–15.5)
WBC: 11.2 10*3/uL — ABNORMAL HIGH (ref 4.0–10.5)

## 2023-09-30 LAB — VITAMIN B12: Vitamin B-12: 321 pg/mL (ref 211–911)

## 2023-09-30 MED ORDER — CYANOCOBALAMIN 1000 MCG/ML IJ SOLN
1000.0000 ug | Freq: Once | INTRAMUSCULAR | Status: AC
  Administered 2023-09-30: 1000 ug via INTRAMUSCULAR

## 2023-09-30 NOTE — Progress Notes (Signed)
   Subjective:    Patient ID: Edward Curtis, male    DOB: 16-Aug-1956, 67 y.o.   MRN: [id number]  HPI Here to follow up on tingling or burning sensations in the mouth. He was here on 09-13-23 for a strep pharyngitis, and this was successfully treated. A follow up throat culture on 09-27-23 was negative. However all along he has had these tingling or burning sensations on the roof of his mouth and to a lesser extent in the posterior OP. He has been Nystatin oral suspension with no response. Of note, he was found to have a low B12 level of 145 on 11-19-22 when he saw Neurology. He was advised to take OTC B12 pills 1000 mcg, but he says he only takes these sporadically.    Review of Systems  Constitutional: Negative.   HENT:  Positive for sore throat. Negative for congestion, ear pain, mouth sores, postnasal drip and sinus pressure.   Eyes: Negative.   Respiratory: Negative.         Objective:   Physical Exam Constitutional:      Appearance: Normal appearance.  HENT:     Right Ear: Tympanic membrane, ear canal and external ear normal.     Left Ear: Tympanic membrane, ear canal and external ear normal.     Nose: Nose normal.     Mouth/Throat:     Pharynx: Oropharynx is clear.  Eyes:     Conjunctiva/sclera: Conjunctivae normal.  Pulmonary:     Effort: Pulmonary effort is normal.     Breath sounds: Normal breath sounds.  Lymphadenopathy:     Cervical: No cervical adenopathy.  Neurological:     Mental Status: He is alert.           Assessment & Plan:  His strep infection has resolved, but he continues to have oral symptoms. Today he also tells me has been having some tingling and numbness in his feet. I believe his mouth sensations are a form of neuropathy from a B12 defiency. We will check another B12 level today, and after that we wil give him a B12 shot. Follow up will depend on the lab results next week. Gershon Crane, MD

## 2023-09-30 NOTE — Addendum Note (Signed)
Addended by: Carola Rhine on: 09/30/2023 02:53 PM   Modules accepted: Orders

## 2023-10-03 ENCOUNTER — Encounter: Payer: Self-pay | Admitting: Family Medicine

## 2023-10-04 LAB — COMPREHENSIVE METABOLIC PANEL
ALT: 16 [IU]/L (ref 0–44)
AST: 21 [IU]/L (ref 0–40)
Albumin: 4.3 g/dL (ref 3.9–4.9)
Alkaline Phosphatase: 70 [IU]/L (ref 44–121)
BUN/Creatinine Ratio: 9 — ABNORMAL LOW (ref 10–24)
BUN: 8 mg/dL (ref 8–27)
Bilirubin Total: 0.4 mg/dL (ref 0.0–1.2)
CO2: 24 mmol/L (ref 20–29)
Calcium: 9.3 mg/dL (ref 8.6–10.2)
Chloride: 104 mmol/L (ref 96–106)
Creatinine, Ser: 0.94 mg/dL (ref 0.76–1.27)
Globulin, Total: 2.2 g/dL (ref 1.5–4.5)
Glucose: 93 mg/dL (ref 70–99)
Potassium: 4.9 mmol/L (ref 3.5–5.2)
Sodium: 139 mmol/L (ref 134–144)
Total Protein: 6.5 g/dL (ref 6.0–8.5)
eGFR: 88 mL/min/{1.73_m2} (ref >59)

## 2023-10-04 LAB — LIPID PANEL
Chol/HDL Ratio: 2.7 {ratio} (ref 0.0–5.0)
Cholesterol, Total: 109 mg/dL (ref 100–199)
HDL: 40 mg/dL (ref >39)
LDL Chol Calc (NIH): 39 mg/dL (ref 0–99)
Triglycerides: 186 mg/dL — ABNORMAL HIGH (ref 0–149)
VLDL Cholesterol Cal: 30 mg/dL (ref 5–40)

## 2023-10-04 LAB — EPSTEIN-BARR VIRUS VCA, IGG: EBV VCA IgG: 610 U/mL — ABNORMAL HIGH

## 2023-10-04 LAB — CYTOMEGALOVIRUS ANTIBODY, IGG: Cytomegalovirus Ab-IgG: >10 U/mL — ABNORMAL HIGH

## 2023-10-04 LAB — EPSTEIN-BARR VIRUS VCA, IGM: EBV VCA IgM: <36 U/mL

## 2023-10-04 LAB — CMV IGM: CMV IgM: <30 [AU]/ml

## 2023-10-05 NOTE — Telephone Encounter (Signed)
Pt called to say he is growing more & more concerned because no one has called him to review his lab results and explain what it all means. Pt would like a call back to go over his results, at your earliest convenience.

## 2023-10-06 ENCOUNTER — Telehealth: Payer: Self-pay

## 2023-10-06 NOTE — Telephone Encounter (Addendum)
Results viewed by patient via MyChart.----- Message from Joni Reining sent at 10/05/2023  7:03 AM EST ----- I have reviewed his cholesterol panel and liver enzymes. Excellent control on Zetia and Repatha. Liver looks good.  No changes in his regimen.

## 2023-10-07 ENCOUNTER — Encounter: Payer: Self-pay | Admitting: Family Medicine

## 2023-10-07 ENCOUNTER — Ambulatory Visit (INDEPENDENT_AMBULATORY_CARE_PROVIDER_SITE_OTHER): Payer: Medicare Other | Admitting: Family Medicine

## 2023-10-07 VITALS — BP 110/70 | HR 91 | Temp 98.1°F | Wt 198.0 lb

## 2023-10-07 DIAGNOSIS — K121 Other forms of stomatitis: Secondary | ICD-10-CM | POA: Diagnosis not present

## 2023-10-07 NOTE — Progress Notes (Signed)
   Subjective:    Patient ID: Edward Curtis, male    DOB: 1956/08/21, 67 y.o.   MRN: [id number]  HPI Here to follow up on soreness in the left side of his mouth and to discuss recent lab results. He tested negative for HIV. The Ig G levels for both CMV and EBV were elevated with normal IgM levels. Since our last visit he has been taking  single dose of 1000 mg Valacyclovir a day, and he says he has felt better, though the mouth soreness is still present.    Review of Systems  Constitutional: Negative.   HENT:  Positive for sore throat. Negative for congestion, ear pain, postnasal drip and sinus pain.   Respiratory: Negative.         Objective:   Physical Exam Constitutional:      Appearance: Normal appearance.  HENT:     Mouth/Throat:     Comments: The left palatine area is slightly erythematous and there is a small whitish area in the middle of it  Neck:     Comments: He is tender in the left AC area without palpable nodes  Pulmonary:     Effort: Pulmonary effort is normal.     Breath sounds: Normal breath sounds.  Neurological:     Mental Status: He is alert.           Assessment & Plan:  His throat and mouth irritation could be due to a herpetic infection, so I advised him to increase the Valacyclovir to 1000 mg TID for the next week. He is scheduled to see ENT on 10-28-23.  Gershon Crane, MD

## 2023-10-07 NOTE — Telephone Encounter (Signed)
He is on the schedule to be seen this afternoon

## 2023-10-14 ENCOUNTER — Encounter: Payer: Self-pay | Admitting: Internal Medicine

## 2023-10-14 ENCOUNTER — Ambulatory Visit: Payer: Medicare Other | Attending: Internal Medicine | Admitting: Internal Medicine

## 2023-10-14 VITALS — BP 118/68 | HR 88 | Ht 68.0 in | Wt 199.2 lb

## 2023-10-14 DIAGNOSIS — T466X5A Adverse effect of antihyperlipidemic and antiarteriosclerotic drugs, initial encounter: Secondary | ICD-10-CM | POA: Diagnosis present

## 2023-10-14 DIAGNOSIS — I517 Cardiomegaly: Secondary | ICD-10-CM | POA: Insufficient documentation

## 2023-10-14 DIAGNOSIS — M791 Myalgia, unspecified site: Secondary | ICD-10-CM | POA: Insufficient documentation

## 2023-10-14 DIAGNOSIS — T466X5D Adverse effect of antihyperlipidemic and antiarteriosclerotic drugs, subsequent encounter: Secondary | ICD-10-CM

## 2023-10-14 DIAGNOSIS — I77810 Thoracic aortic ectasia: Secondary | ICD-10-CM | POA: Insufficient documentation

## 2023-10-14 DIAGNOSIS — E785 Hyperlipidemia, unspecified: Secondary | ICD-10-CM | POA: Insufficient documentation

## 2023-10-14 NOTE — Progress Notes (Signed)
LIPID CLINIC CONSULT NOTE  Chief Complaint:  Follow-up dyslipidemia  Primary Care Physician: [name], MD  Primary Cardiologist:  Norman Herrlich, MD  HPI:  Edward Curtis is a 67 y.o. male who is being seen today for the evaluation of dyslipidemia at the request of [name], MD. This is a 67 year old male kindly referred by Dr. Dulce Sellar for evaluation and management of dyslipidemia.  He previously seen Laural Golden, PharmD in our lipid clinic and was started on Repatha.  This was following several medication changes including a number of statins both high and low potency that he did not tolerate as well as adding ezetimibe.  He said he had only been on ezetimibe for a couple of weeks and then started on Repatha.  He has a history of low back pain and also has a history of coronary disease with a stent in 2020.  He was previously followed by Dr. Donnie Aho.  After giving himself the first injection of Repatha he reported low back pain which worsened over the course of about a week and eventually improved.  He felt that this could be related since he looked up side effects of the medicine which included back pain and then did not pursue any more doses of the medication.  Prior to starting the medicine in July his total cholesterol 217, triglycerides 220 HDL 30 and LDL of 147 with a target LDL of less than 70 given his coronary disease history.  12/31/2020  Edward Curtis returns today for follow-up.  He initially thought that he had side effects with the Repatha causing back pain.  It might be because he said he continued to have some back pain issues however it seemed that it seem to be getting better and better after every dose.  The last dose he noted no side effects from it.  His cholesterol has responded significantly.  His total is now 95, triglycerides 170, HDL 35 and LDL 32 (decreased from 147).  He remains on ezetimibe.  He is concerned because he is recently gained about 50 pounds.  He is now  scheduled to start a commercial weight loss program with weight watchers.  He is hoping to get that weight down in about 6 months.  07/21/2021  Edward Curtis returns today for follow-up of dyslipidemia.  Overall he seems to be doing well on his current therapies.  His cholesterol is gone up slightly with LDL now at 48 up from 32.  Triglycerides are lower at 155 and total cholesterol is risen slightly to 109.  Overall he is tolerating the medicines well.  He has no complaints.  He denies any chest pain or worsening shortness of breath.  He had seen Dr. Dulce Sellar earlier in the year who indicated that he may be retiring soon and that Mr. Strojny may wish to speak with me about switching providers to me.  08/24/2022  Edward Curtis is seen today in follow-up.  He reports feeling well.  Denies any chest pain or worsening shortness of breath.  Unfortunately he has had some weight gain.  Despite this his lipids are improved further.  He remains on Repatha and ezetimibe.  Total cholesterol 91, triglycerides 168, HDL 35 and LDL 28.  Blood pressure is well controlled at 126/78.  Is on low-dose aspirin.  He says he is been less physically active recently and really wants to work to try to get the weight down and be more active.  He has had some issues  with his back and we talked about stretching exercises and other things he could do to help with this.  10/14/2023  Edward Curtis is seen today in follow-up.  Overall he is doing well.  He said he had a couple of illnesses recently including strep throat and probably herpetic stomatitis.  Other than that he has had some weight loss but has recently gained some weight back.  Blood pressure is well-controlled today.  He had an echo this summer which showed normal LVEF but did show a dilated ascending aorta to 40 mm.  This will require follow-up.  He denies any anginal symptoms.   PMHx:  Past Medical History:  Diagnosis Date   Allergy    seasonal allergies    Arthritis    Asthma     Blood transfusion without reported diagnosis 10/26/1983   Calcium oxalate renal stones    early 1990's   Cataract    Chicken pox    Chronic neck pain    Depression    GERD (gastroesophageal reflux disease)    Hyperlipidemia 10/10/2013   Hypertrophic obstructive cardiomyopathy(425.11) 10/10/2013   IBS (irritable bowel syndrome) 12/07/2011   Migraines    Neuromuscular disorder (HCC)    radiculopathy left side    Obesity (BMI 30-39.9) 10/10/2013   Seasonal allergies     Past Surgical History:  Procedure Laterality Date   CATARACT EXTRACTION Left 10/2020   CHOLECYSTECTOMY N/A 06/19/2021   Procedure: LAPAROSCOPIC CHOLECYSTECTOMY WITH POSSIBLE INTRAOPERATIVE CHOLANGIOGRAM;  Surgeon: Berna Bue, MD;  Location: [facility];  Service: General;  Laterality: N/A;   CORONARY STENT INTERVENTION N/A 03/23/2019   Procedure: CORONARY STENT INTERVENTION;  Surgeon: Marykay Lex, MD;  Location: [facility] INVASIVE CV LAB;  Service: Cardiovascular;  Laterality: N/A;   LEFT HEART CATH AND CORONARY ANGIOGRAPHY N/A 03/23/2019   Procedure: LEFT HEART CATH AND CORONARY ANGIOGRAPHY;  Surgeon: Marykay Lex, MD;  Location: [facility] INVASIVE CV LAB;  Service: Cardiovascular;  Laterality: N/A;   left knee surgery  1990   MOUTH SURGERY  1975/1993   pre cancerous mole  2013   prostate procedure  1980   umbilical cyst  1985    FAMHx:  Family History  Problem Relation Age of Onset   Arthritis Mother    Hyperlipidemia Mother    Heart disease Mother    Hypertension Mother    Heart disease Father    Hypertension Father    Diabetes Father    Arthritis Paternal Grandmother    Diabetes Paternal Grandmother    Colon cancer Paternal Grandfather    Heart disease Paternal Grandfather    Diabetes Paternal Grandfather    Hyperlipidemia Maternal Grandmother    Hypertension Other        both sets of parents   Esophageal cancer Neg Hx    Rectal cancer Neg Hx    Stomach cancer Neg Hx     SOCHx:   reports that he  has never smoked. He has never used smokeless tobacco. He reports current alcohol use of about 1.0 standard drink of alcohol per week. He reports that he does not use drugs.  ALLERGIES:  Allergies  Allergen Reactions   Ciprofloxacin Other (See Comments)    Possible "leg swelling, pain, unable to walk"   Iodinated Contrast Media Anaphylaxis, Hives, Itching and Swelling    Patient must be premedicated with 13 hour protocol prior to IV contrast administration   Penicillins Hives    Did it involve swelling of the face/tongue/throat, SOB, or  low BP? No Did it involve sudden or severe rash/hives, skin peeling, or any reaction on the inside of your mouth or nose? Yes Did you need to seek medical attention at a hospital or doctor's office? Yes When did it last happen?      25 + years ago If all above answers are "NO", may proceed with cephalosporin use.    Yellow Jacket Venom Anaphylaxis    Swelling    Atorvastatin     myaglias   Clindamycin/Lincomycin Diarrhea and Nausea Only   Erythromycin Diarrhea and Nausea Only   Ibuprofen Other (See Comments)    Causes tachycardia   Keflex [Cephalexin]     Rash hives   Other Itching and Other (See Comments)     Walnut trees and walnuts , sores in mouth   Rosuvastatin     myaligias   Sulfa Antibiotics Hives   Latex Rash    Developed rash 06/2017 that progressed.  Allergy testing positive for latex allergy.  Later determined rash was 2/2 latex in [employer] foods.    ROS: Pertinent items noted in HPI and remainder of comprehensive ROS otherwise negative.  HOME MEDS: Current Outpatient Medications on File Prior to Visit  Medication Sig Dispense Refill   aspirin EC 81 MG tablet Take 81 mg by mouth daily.     Cyanocobalamin (VITAMIN B 12) 500 MCG TABS Take 500 mcg by mouth daily.     diphenhydrAMINE (BENADRYL) 25 MG tablet Take 25 mg by mouth daily as needed (allergic reactions).     EPINEPHrine (EPIPEN 2-PAK) 0.3 mg/0.3 mL IJ SOAJ injection  Inject 0.3 mLs (0.3 mg total) into the muscle once as needed (for severe allergic reaction). CAll 911 immediately if you have to use this medicine 1 each 1   Evolocumab (REPATHA SURECLICK) 140 MG/ML SOAJ INJECT 140MG  INTO THE SKIN EVERY 14 DAYS 2 mL 11   ezetimibe (ZETIA) 10 MG tablet Take 1 tablet (10 mg total) by mouth daily. 90 tablet 1   ketoconazole (NIZORAL) 2 % cream Apply topically daily.     Melatonin 1 MG CHEW Chew 1 mg by mouth daily.     nitroGLYCERIN (NITROSTAT) 0.4 MG SL tablet ONE TABLET UNDER TONGUE EVERY 5 MINUTES AS NEEDED FOR CHEST PAIN 25 tablet 3   Polyethylene Glycol 3350 (MIRALAX PO) Take by mouth.     tadalafil (CIALIS) 5 MG tablet Take 1 tablet (5 mg total) by mouth daily as needed for erectile dysfunction. 10 tablet 1   valACYclovir (VALTREX) 1000 MG tablet Take 1,000 mg by mouth daily as needed.     verapamil (CALAN) 80 MG tablet TAKE ONE TABLET DAILY AS NEEDED 30 tablet 1   verapamil (CALAN-SR) 180 MG CR tablet Take 1 tablet (180 mg total) by mouth daily. 90 tablet 3   No current facility-administered medications on file prior to visit.    LABS/IMAGING: No results found for this or any previous visit (from the past 48 hours). No results found.  LIPID PANEL:    Component Value Date/Time   CHOL 109 10/04/2023 0809   TRIG 186 (H) 10/04/2023 0809   HDL 40 10/04/2023 0809   CHOLHDL 2.7 10/04/2023 0809   CHOLHDL 3 06/04/2021 0849   VLDL 32.6 06/04/2021 0849   LDLCALC 39 10/04/2023 0809   LDLDIRECT 151.2 09/04/2014 0926    WEIGHTS: Wt Readings from Last 3 Encounters:  10/14/23 199 lb 3.2 oz (90.4 kg)  10/07/23 198 lb (89.8 kg)  09/30/23 193 lb 6.4 oz (  87.7 kg)    VITALS: BP 118/68 (BP Location: Left Arm, Patient Position: Sitting, Cuff Size: Normal)   Pulse 88   Ht 5\' 8"  (1.727 m)   Wt 199 lb 3.2 oz (90.4 kg)   SpO2 98%   BMI 30.29 kg/m   EXAM: General appearance: alert and no distress Lungs: clear to auscultation bilaterally Heart: regular rate  and rhythm, S1, S2 normal, no murmur, click, rub or gallop Extremities: extremities normal, atraumatic, no cyanosis or edema Neurologic: Grossly normal   EKG: EKG Interpretation Date/Time:  Friday October 14 2023 08:41:51 EST Ventricular Rate:  88 PR Interval:  134 QRS Duration:  118 QT Interval:  382 QTC Calculation: 462 R Axis:   -13  Text Interpretation: Normal sinus rhythm Right bundle branch block When compared with ECG of 12-Apr-2023 08:19, No significant change since last tracing Confirmed by Zoila Shutter [phone]) on 10/14/2023 8:51:56 AM    ASSESSMENT: CAD status post PCI in 2020 Mixed dyslipidemia, goal LDL less than 70 RBBB Statin intolerance-myalgias Chronic low back pain Hypertension Dilated aorta to 40 mm  PLAN: 1.   EdwardLanzer denies any chest pain or worsening shortness of breath.  His echo this summer showed a dilated aorta to 40 mm.  I would like to have repeat imaging next summer to follow-up on that and ensure stability.  Will get another echo.  Unfortunately has a contrast dye allergy so CT would be less optimal and he declined an MRI because he is claustrophobic.  EKG stable with right bundle branch block.  His lipids have been well treated on Repatha.  His LDL is 39 with minimal elevation in triglycerides at 186.  No changes to his medicines at this time.  Plan follow-up in 6 months or sooner as necessary.  Chrystie Nose, MD, [facility], FACP  [place]  [facility]  Medical Director of the Advanced Lipid [facility] &  Cardiovascular Risk Reduction Clinic Diplomate of the American Board of Clinical Lipidology Attending Cardiologist  Direct Dial: [phone]  Fax: [phone]  Website:  [email or web address] Nicely Saylee Sherrill 10/14/2023, 8:52 AM

## 2023-10-14 NOTE — Patient Instructions (Signed)
Medication Instructions:  NO CHANGES  *If you need a refill on your cardiac medications before your next appointment, please call your pharmacy*   Testing/Procedures: Your physician has requested that you have an echocardiogram. Echocardiography is a painless test that uses sound waves to create images of your heart. It provides your doctor with information about the size and shape of your heart and how well your heart's chambers and valves are working. This procedure takes approximately one hour. There are no restrictions for this procedure. Please do NOT wear cologne, perfume, aftershave, or lotions (deodorant is allowed). Please arrive 15 minutes prior to your appointment time.  Please note: We ask at that you not bring children with you during ultrasound (echo/ vascular) testing. Due to room size and safety concerns, children are not allowed in the ultrasound rooms during exams. Our front office staff cannot provide observation of children in our lobby area while testing is being conducted. An adult accompanying a patient to their appointment will only be allowed in the ultrasound room at the discretion of the ultrasound technician under special circumstances. We apologize for any inconvenience. ** Due JUNE 2025   Follow-Up: At [facility], you and your health needs are our priority.  As part of our continuing mission to provide you with exceptional heart care, we have created designated Provider Care Teams.  These Care Teams include your primary Cardiologist (physician) and Advanced Practice Providers (APPs -  Physician Assistants and Nurse Practitioners) who all work together to provide you with the care you need, when you need it.  We recommend signing up for the patient portal called "MyChart".  Sign up information is provided on this After Visit Summary.  MyChart is used to connect with patients for Virtual Visits (Telemedicine).  Patients are able to view lab/test results,  encounter notes, upcoming appointments, etc.  Non-urgent messages can be sent to your provider as well.   To learn more about what you can do with MyChart, go to [email or web address].    Your next appointment:    6 months with Dr. Rennis Golden -- after echo

## 2023-10-26 HISTORY — PX: CATARACT EXTRACTION: SUR2

## 2023-10-28 ENCOUNTER — Other Ambulatory Visit (HOSPITAL_COMMUNITY): Payer: Self-pay | Admitting: *Deleted

## 2023-10-28 ENCOUNTER — Ambulatory Visit (INDEPENDENT_AMBULATORY_CARE_PROVIDER_SITE_OTHER): Payer: Medicare Other | Admitting: Otolaryngology

## 2023-10-28 ENCOUNTER — Encounter (INDEPENDENT_AMBULATORY_CARE_PROVIDER_SITE_OTHER): Payer: Self-pay | Admitting: Otolaryngology

## 2023-10-28 VITALS — BP 123/75 | HR 109 | Ht 68.0 in | Wt 200.0 lb

## 2023-10-28 DIAGNOSIS — J312 Chronic pharyngitis: Secondary | ICD-10-CM | POA: Diagnosis not present

## 2023-10-28 DIAGNOSIS — J3089 Other allergic rhinitis: Secondary | ICD-10-CM | POA: Diagnosis not present

## 2023-10-28 DIAGNOSIS — K146 Glossodynia: Secondary | ICD-10-CM | POA: Diagnosis not present

## 2023-10-28 DIAGNOSIS — R0981 Nasal congestion: Secondary | ICD-10-CM

## 2023-10-28 DIAGNOSIS — R09A2 Foreign body sensation, throat: Secondary | ICD-10-CM

## 2023-10-28 DIAGNOSIS — R059 Cough, unspecified: Secondary | ICD-10-CM

## 2023-10-28 DIAGNOSIS — R1310 Dysphagia, unspecified: Secondary | ICD-10-CM

## 2023-10-28 DIAGNOSIS — R0982 Postnasal drip: Secondary | ICD-10-CM

## 2023-10-28 MED ORDER — FAMOTIDINE 20 MG PO TABS: 20.0000 mg | ORAL_TABLET | Freq: Two times a day (BID) | ORAL | 3 refills | Status: DC

## 2023-10-28 NOTE — Progress Notes (Signed)
 ENT CONSULT:  Reason for Consult: chronic sore throat and mouth burning worse on the left side  HPI: Discussed the use of AI scribe software for clinical note transcription with the patient, who gave verbal consent to proceed.  History of Present Illness   The patient is a 69 yoM initially presented with a strep throat infection around Halloween, which was treated with a Z-Pak and doxycycline  due to a penicillin allergy. Following the treatment, the patient reported persistent sore throat, predominantly on the left side, along with oral discomfort including a sore and tingly tongue, gums, and roof of the mouth. The patient also reported hair loss and left-sided ear pain.  Despite a negative blood test, the patient was put on Valtrex due to a suspected herpes infection. The patient reported fluctuating symptoms, with periods of improvement followed by worsening. The patient eventually discontinued the Valtrex. An antifungal medication, Fluconazole , was also tried but discontinued after five days due to lack of improvement. A nystatin  rinse was prescribed but was not tolerated due to an allergic reaction.  The patient reported a history of allergies and is currently managed with Zyrtec as needed. The patient also reported a family history of difficulty swallowing, which they also experience intermittently, particularly when consuming dry foods. The patient noted that stress seems to exacerbate this symptom. The patient occasionally takes Pepcid  for reflux, which provides some relief.  The patient also reported a significant weight loss of 45 pounds over the summer due to dieting, followed by a rapid weight gain after resuming regular eating habits. The patient recalled a severe episode of reflux after this weight gain, which preceded the onset of the current symptoms. The patient has recently started taking probiotics and has noticed some improvement in symptoms over the past week.     Records  Reviewed:  Family Medicine Office Visit 10/07/23 Dr Johnny Here to follow up on soreness in the left side of his mouth and to discuss recent lab results. He tested negative for HIV. The Ig G levels for both CMV and EBV were elevated with normal IgM levels. Since our last visit he has been taking  single dose of 1000 mg Valacyclovir a day, and he says he has felt better, though the mouth soreness is still present.  His throat and mouth irritation could be due to a herpetic infection, so I advised him to increase the Valacyclovir to 1000 mg TID for the next week. He is scheduled to see ENT on 10-28-23.    Past Medical History:  Diagnosis Date   Allergy    seasonal allergies    Arthritis    Asthma    Blood transfusion without reported diagnosis 10/26/1983   Calcium  oxalate renal stones    early 1990's   Cataract    Chicken pox    Chronic neck pain    Depression    GERD (gastroesophageal reflux disease)    Hyperlipidemia 10/10/2013   Hypertrophic obstructive cardiomyopathy(425.11) 10/10/2013   IBS (irritable bowel syndrome) 12/07/2011   Migraines    Neuromuscular disorder (HCC)    radiculopathy left side    Obesity (BMI 30-39.9) 10/10/2013   Seasonal allergies     Past Surgical History:  Procedure Laterality Date   CATARACT EXTRACTION Left 10/2020   CHOLECYSTECTOMY N/A 06/19/2021   Procedure: LAPAROSCOPIC CHOLECYSTECTOMY WITH POSSIBLE INTRAOPERATIVE CHOLANGIOGRAM;  Surgeon: Signe Mitzie LABOR, MD;  Location: [facility];  Service: General;  Laterality: N/A;   CORONARY STENT INTERVENTION N/A 03/23/2019   Procedure: CORONARY STENT  INTERVENTION;  Surgeon: Anner Alm ORN, MD;  Location: [facility] INVASIVE CV LAB;  Service: Cardiovascular;  Laterality: N/A;   LEFT HEART CATH AND CORONARY ANGIOGRAPHY N/A 03/23/2019   Procedure: LEFT HEART CATH AND CORONARY ANGIOGRAPHY;  Surgeon: Anner Alm ORN, MD;  Location: [facility] INVASIVE CV LAB;  Service: Cardiovascular;  Laterality: N/A;   left knee surgery  1990    MOUTH SURGERY  1975/1993   pre cancerous mole  2013   prostate procedure  1980   umbilical cyst  1985    Family History  Problem Relation Age of Onset   Arthritis Mother    Hyperlipidemia Mother    Heart disease Mother    Hypertension Mother    Heart disease Father    Hypertension Father    Diabetes Father    Arthritis Paternal Grandmother    Diabetes Paternal Grandmother    Colon cancer Paternal Grandfather    Heart disease Paternal Grandfather    Diabetes Paternal Grandfather    Hyperlipidemia Maternal Grandmother    Hypertension Other        both sets of parents   Esophageal cancer Neg Hx    Rectal cancer Neg Hx    Stomach cancer Neg Hx     Social History:  reports that he has never smoked. He has never used smokeless tobacco. He reports current alcohol use of about 1.0 standard drink of alcohol per week. He reports that he does not use drugs.  Allergies:  Allergies  Allergen Reactions   Ciprofloxacin  Other (See Comments)    Possible leg swelling, pain, unable to walk   Iodinated Contrast Media Anaphylaxis, Hives, Itching and Swelling    Patient must be premedicated with 13 hour protocol prior to IV contrast administration   Penicillins Hives    Did it involve swelling of the face/tongue/throat, SOB, or low BP? No Did it involve sudden or severe rash/hives, skin peeling, or any reaction on the inside of your mouth or nose? Yes Did you need to seek medical attention at a hospital or doctor's office? Yes When did it last happen?      25 + years ago If all above answers are "NO", may proceed with cephalosporin use.    Yellow Jacket Venom Anaphylaxis    Swelling    Atorvastatin      myaglias   Clindamycin/Lincomycin Diarrhea and Nausea Only   Erythromycin Diarrhea and Nausea Only   Ibuprofen Other (See Comments)    Causes tachycardia   Keflex [Cephalexin]     Rash hives   Other Itching and Other (See Comments)     Walnut trees and walnuts , sores in mouth    Rosuvastatin      myaligias   Sulfa Antibiotics Hives   Latex Rash    Developed rash 06/2017 that progressed.  Allergy testing positive for latex allergy.  Later determined rash was 2/2 latex in [employer] foods.    Medications: I have reviewed the patient's current medications.  The PMH, PSH, Medications, Allergies, and SH were reviewed and updated.  ROS: Constitutional: Negative for fever, weight loss and weight gain. Cardiovascular: Negative for chest pain and dyspnea on exertion. Respiratory: Is not experiencing shortness of breath at rest. Gastrointestinal: Negative for nausea and vomiting. Neurological: Negative for headaches. Psychiatric: The patient is not nervous/anxious  Blood pressure 123/75, pulse (!) 109, height 5' 8 (1.727 m), weight 200 lb (90.7 kg), SpO2 98%.  PHYSICAL EXAM:  Exam: General: Well-developed, well-nourished Communication and Voice: Slightly raspy Respiratory Respiratory effort:  Equal inspiration and expiration without stridor Cardiovascular Peripheral Vascular: Warm extremities with equal color/perfusion Eyes: No nystagmus with equal extraocular motion bilaterally Neuro/Psych/Balance: Patient oriented to person, place, and time; Appropriate mood and affect; Gait is intact with no imbalance; Cranial nerves I-XII are intact Head and Face Inspection: Normocephalic and atraumatic without mass or lesion Palpation: Facial skeleton intact without bony stepoffs Salivary Glands: No mass or tenderness Facial Strength: Facial motility symmetric and full bilaterally ENT Pinna: External ear intact and fully developed External canal: Canal is patent with intact skin Tympanic Membrane: Clear and mobile External Nose: No scar or anatomic deformity Internal Nose: Septum is deviated to the left. No polyp, or purulence. Mucosal edema and erythema present.  Bilateral inferior turbinate hypertrophy.  Lips, Teeth, and gums: Mucosa and teeth intact and viable TMJ:  No pain to palpation with full mobility Oral cavity/oropharynx: No erythema or exudate, no lesions present Nasopharynx: No mass or lesion with intact mucosa Hypopharynx: Intact mucosa without pooling of secretions Larynx Glottic: Full true vocal cord mobility without lesion or mass Supraglottic: Normal appearing epiglottis and AE folds Interarytenoid Space: Moderate pachydermia&edema Subglottic Space: Patent without lesion or edema Neck Neck and Trachea: Midline trachea without mass or lesion Thyroid : No mass or nodularity Lymphatics: No lymphadenopathy  Procedure: Preoperative diagnosis: chronic sore throat and burning in the mouth, left sided throat and ear discomfort  Postoperative diagnosis:   Same  Procedure: Flexible fiberoptic laryngoscopy  Surgeon: Elena Larry, MD  Anesthesia: Topical lidocaine  and Afrin Complications: None Condition is stable throughout exam  Indications and consent:  The patient presents to the clinic with Indirect laryngoscopy view was incomplete. Thus it was recommended that they undergo a flexible fiberoptic laryngoscopy. All of the risks, benefits, and potential complications were reviewed with the patient preoperatively and verbal informed consent was obtained.  Procedure: The patient was seated upright in the clinic. Topical lidocaine  and Afrin were applied to the nasal cavity. After adequate anesthesia had occurred, I then proceeded to pass the flexible telescope into the nasal cavity. The nasal cavity was patent without rhinorrhea or polyp. The nasopharynx was also patent without mass or lesion. The base of tongue was visualized and was normal. There were no signs of pooling of secretions in the piriform sinuses. The true vocal folds were mobile bilaterally. There were no signs of glottic or supraglottic mucosal lesion or mass. There was moderate interarytenoid pachydermia and post cricoid edema. The telescope was then slowly withdrawn and the  patient tolerated the procedure throughout.    PROCEDURE NOTE: nasal endoscopy  Preoperative diagnosis: chronic sinusitis symptoms  Postoperative diagnosis: same  Procedure: Diagnostic nasal endoscopy (68768)  Surgeon: Elena Larry, M.D.  Anesthesia: Topical lidocaine  and Afrin  H&P REVIEW: The patient's history and physical were reviewed today prior to procedure. All medications were reviewed and updated as well. Complications: None Condition is stable throughout exam Indications and consent: The patient presents with symptoms of chronic sinusitis not responding to previous therapies. All the risks, benefits, and potential complications were reviewed with the patient preoperatively and informed consent was obtained. The time out was completed with confirmation of the correct procedure.   Procedure: The patient was seated upright in the clinic. Topical lidocaine  and Afrin were applied to the nasal cavity. After adequate anesthesia had occurred, the rigid nasal endoscope was passed into the nasal cavity. The nasal mucosa, turbinates, septum, and sinus drainage pathways were visualized bilaterally. This revealed no purulence or significant secretions that might be cultured. There were  no polyps or sites of significant inflammation. The mucosa was intact and there was no crusting present. The scope was then slowly withdrawn and the patient tolerated the procedure well. There were no complications or blood loss.  Studies Reviewed: CXR 1 View 06/18/21 FINDINGS: The heart size and mediastinal contours are within normal limits. Both lungs are clear. The visualized skeletal structures are unremarkable.   IMPRESSION: No acute abnormality noted.  Assessment/Plan: Encounter Diagnoses  Name Primary?   Post-nasal drip    Environmental and seasonal allergies    Chronic nasal congestion    Chronic sore throat Yes   Burning mouth syndrome    Dysphagia, unspecified type    Globus sensation      Assessment and Plan   Chronic sore throat worse on the left side Exam without masses or lesions, flexible laryngoscopy and bilateral nasal endoscopy performed today with evidence of GERD LPR and nasal congestion but no polyps or pus and no evidence of thrush.  We discussed that this could be due to burning mouth syndrome versus chronic inflammation from GERD LPR.  Burning Mouth Syndrome Symptoms include sore throat, tingly mouth, and tongue since a strep infection treated with antibiotics and antivirals. Strep culture was negative at the time. Possible contributing factors include low B12 levels (on supplementation right now) and changes in oral microbiome. Differential diagnosis includes vitamin deficiencies and microbiome disruption. Discussed potential treatments including B12 supplementation, alpha lipoic acid, lysine, vitamin E oil, and capsaicin cream application to help with sx. Explained that capsaicin cream may help by desensitizing the mouth to the burning sensation. - Continue B12 supplementation, 1000 mcg daily - Consider alpha lipoic acid supplementation - Consider lysine or vitamin E oil supplementation - If symptoms persist, consider capsaicin cream application -Detailed handout on burning mouth syndrome causes and treatment options was provided  Gastroesophageal Reflux Disease (GERD) Symptoms include throat irritation, sensation of a lump, and history of severe reflux episode. Symptoms may contribute to chronic sore throat and mouth issues. Discussed potential benefits of famotidine  and Reflux Gourmet supplement to reduce reflux symptoms and improve throat irritation. - Prescribe famotidine  20 mg twice daily for 30 days - Recommend Reflux Gourmet supplement after meals -Diet and lifestyle changes to minimize GERD  Dysphagia Intermittent difficulty swallowing solid foods, particularly dry foods. No issues with liquids. Family history of similar symptoms. Symptoms may be  exacerbated by stress. Discussed the need for a swallow study to further evaluate swallowing difficulties. - Order barium swallow study MBS and esophagram  Allergic Rhinitis, postnasal drainage Allergies managed with Zyrtec as needed. No regular use of nasal sprays  - Continue Zyrtec 10 mg daily   Follow-up - Schedule follow-up appointment in a few months to discuss swallow study results  -I advised him to discuss hair loss with his PCP     Thank you for allowing me to participate in the care of this patient. Please do not hesitate to contact me with any questions or concerns.   Elena Larry, MD Otolaryngology [facility] Phone: [phone] Fax: [phone]    10/28/2023, 3:26 PM

## 2023-10-28 NOTE — Patient Instructions (Addendum)
 - schedule swallow study - take Pepcid  and reflux gourmet  BURNING MOUTH SYNDROME  1. Stress management helps more than any single other intervention. This could mean using a meditation app like Headspace, working a therapist who does [employer], or finding a counselor through your primary care doctor or an online app like BetterHelp.  2. Capsaicin has been shown to help by burning out the pain receptors. This is a chemical that comes from the red (hot) pepper. It works by depleting substance P, the chemical that allows nerves to communicate pain/itch. In your condition, the nerves are overactive in the skin. Depleting their ability to signal will result in loss of ability to signal baseline itch/pain. Apply Capsaicin at prescribed doseage (0.025% for oral mucosa).  Please wear gloves or thoroughly wash hands after application.  If you do get this on another body area that is undesired, using olive or vegetable oil is best to dissolve this chemical but does not work instantaneously. It will burn at first, but that will get better with continued use.  You do need to stick to this treatment for it to be effective. Treat in the following manner. Capsaicin 4x daily to affected area x 1 week Capsaicin 3x daily to affected area x 1 week Capsaicin 2x daily to affected area x 2 weeks Capsaicin 1x daily to affected area until follow-up/as needed.  3. Vitamin B12 1000mcg daily  4. Make sure your doctor has sent bloodwork to check for any issues with vitamin levels  5. Try taking alpha lipoic acid dose of 600-800 mg/day   Anecdotal treatments from patients: 1. Vitamin E oil (open the capsule and apply it directly to tongue). 2.  Lysine supplementation  Patient Information Sheet: Burning Mouth Syndrome (BMS)**  What is Burning Mouth Syndrome (BMS)? Burning Mouth Syndrome (BMS) is a condition characterized by a persistent, often painful, burning sensation in the mouth without an  obvious cause. It can affect the tongue, lips, gums, palate, or the entire mouth.  Symptoms of BMS: - Burning Sensation: Typically feels like scalding or tingling in the mouth, often worsening as the day progresses. - Dry Mouth: You may feel like your mouth is dry, even though saliva production is normal. - Altered Taste: Some patients experience a bitter or metallic taste in the mouth. - Numbness or Tingling: Occasionally, patients report sensations of numbness or tingling.  Who Gets BMS? BMS most commonly affects: - Women, especially postmenopausal women. - Adults over 4 years old.  Possible Causes: The exact cause of BMS is often unknown, but some contributing factors include: - Nerve Damage: Problems with nerves that control taste and pain. - Hormonal Changes: Especially in women going through menopause. - Nutritional Deficiencies: Low levels of iron, zinc, or vitamin B12. - Dry Mouth: From medications or conditions like Sjgren's syndrome. - Allergies or Reactions: To foods, dental materials, or oral care products. - Stress and Anxiety: Psychological factors may exacerbate symptoms.  #### **Diagnosis:** BMS is a diagnosis of exclusion, meaning it is diagnosed after other causes are ruled out. Your doctor or dentist may perform: - A physical examination. - Blood tests to check for nutritional deficiencies. - Allergy tests or saliva flow tests. - Oral swabs to rule out infections.  #### **Treatment Options:** While there is no definitive cure for BMS, the following treatments may help alleviate symptoms: 1. **Medications**:    - Pain relievers, such as over-the-counter analgesics or prescription medications like tricyclic antidepressants, benzodiazepines, or anticonvulsants.    -  Oral rinses with anesthetic properties. 2. **Saliva Substitutes**: To relieve dry mouth symptoms. 3. **Dietary Changes**: Adjusting your diet to avoid spicy, acidic, or irritating foods. 4. **Vitamin  or Mineral Supplements**: If deficiencies are identified. 5. **Cognitive Behavioral Therapy (CBT)**: For managing anxiety or stress that may worsen symptoms. 6. **Avoid Irritants**: Avoid alcohol, tobacco, and irritating dental products.  #### **Self-care Tips:** - **Stay Hydrated**: Drink plenty of water to keep your mouth moist. - **Avoid Triggers**: Spicy, acidic foods, and alcohol may exacerbate symptoms. - **Practice Good Oral Hygiene**: Use gentle, non-irritating oral care products. - **Stress Management**: Engage in relaxation techniques like meditation or yoga.  [email or web address].nl - check out this website to learn more about reflux   -Avoid lying down for at least two hours after a meal or after drinking acidic beverages, like soda, or other caffeinated beverages. This can help to prevent stomach contents from flowing back into the esophagus. -Keep your head elevated while you sleep. Using an extra pillow or two can also help to prevent reflux. -Eat smaller and more frequent meals each day instead of a few large meals. This promotes digestion and can aid in preventing heartburn. -Wear loose-fitting clothes to ease pressure on the stomach, which can worsen heartburn and reflux. -Reduce excess weight around the midsection. This can ease pressure on the stomach. Such pressure can force some stomach contents back up the esophagus  - Take Reflux Gourmet (natural supplement available on Amazon) to help with symptoms of chronic throat irritation

## 2023-11-23 ENCOUNTER — Encounter (HOSPITAL_COMMUNITY): Payer: Medicare Other

## 2023-11-23 ENCOUNTER — Other Ambulatory Visit (HOSPITAL_COMMUNITY): Payer: Medicare Other

## 2023-12-14 ENCOUNTER — Encounter (HOSPITAL_COMMUNITY): Payer: Medicare Other

## 2023-12-14 ENCOUNTER — Ambulatory Visit (HOSPITAL_COMMUNITY): Payer: Medicare Other

## 2023-12-30 ENCOUNTER — Ambulatory Visit (HOSPITAL_COMMUNITY)
Admission: RE | Admit: 2023-12-30 | Discharge: 2023-12-30 | Disposition: A | Discharge status: Home / Self Care | Payer: Medicare Other | Source: Ambulatory Visit | Attending: Otolaryngology | Admitting: Otolaryngology

## 2023-12-30 ENCOUNTER — Ambulatory Visit (HOSPITAL_COMMUNITY)
Admission: RE | Admit: 2023-12-30 | Discharge: 2023-12-30 | Disposition: A | Discharge status: Home / Self Care | Payer: Medicare Other | Source: Ambulatory Visit | Attending: Family Medicine | Admitting: Family Medicine

## 2023-12-30 DIAGNOSIS — R059 Cough, unspecified: Secondary | ICD-10-CM | POA: Diagnosis not present

## 2023-12-30 DIAGNOSIS — K224 Dyskinesia of esophagus: Secondary | ICD-10-CM | POA: Insufficient documentation

## 2023-12-30 DIAGNOSIS — K449 Diaphragmatic hernia without obstruction or gangrene: Secondary | ICD-10-CM | POA: Diagnosis not present

## 2023-12-30 DIAGNOSIS — R1310 Dysphagia, unspecified: Secondary | ICD-10-CM

## 2023-12-30 NOTE — Evaluation (Signed)
 Modified Barium Swallow Study  Patient Details  Name: Edward Curtis MRN: [id number] Date of Birth: May 24, 1956  Today's Date: 12/30/2023  Modified Barium Swallow completed.  Full report located under Chart Review in the Imaging Section.  History of Present Illness Edward Curtis is a 68 y.o. male who was referred for an OP MBS by Dr. Irene Pap, [facility], due to intermittent difficulty swallowing solid foods, particularly dry food. He reports chronic sore throat worse on the left side- has improved since he saw Dr. Irene Pap; has postnasal drainage and allergic rhinitis, GERD, and Burning Mouth Syndrome. Endoscopy on 10/28/23: " Exam without masses or lesions, flexible laryngoscopy and bilateral nasal endoscopy performed today with evidence of GERD LPR and nasal congestion but no polyps or pus and no evidence of thrush.  We discussed that this could be due to burning mouth syndrome versus chronic inflammation from GERD LPR." He endorses tough meats/breads getting caught in sternum area; occasional regurgitation.    Clinical Impression Pt presents with normal oropharyngeal swallowing with thorough mastication and good bolus control, intermittent high penetration of liquids into larynx- Penetration/Aspiration Scale Score of 2, considered WFL.  There was no aspiration. There was complete bolus clearance from the pharynx.  No pill was administered due to subsequent esophagram with admin of pill.  No dysphagia identified.  Continue current diet; consider diet modifications to minimize reflux, which was discussed with pt. No SLP f/u needed. Factors that may increase risk of adverse event in presence of aspiration Edward Curtis & Clearance Edward Curtis):    Swallow Evaluation Recommendations Recommendations: PO diet PO Diet Recommendation: Regular;Thin liquids (Level 0) Liquid Administration via: Cup;Straw Medication Administration: Whole meds with liquid Supervision: Patient able to  self-feed      Edward Curtis Edward Curtis 12/30/2023,12:01 PM Edward Folks L. Samson Frederic, MA CCC/SLP Clinical Specialist - Acute Care SLP Acute Rehabilitation Services Office number [phone]

## 2024-02-20 ENCOUNTER — Encounter: Payer: Self-pay | Admitting: Family Medicine

## 2024-02-20 ENCOUNTER — Encounter: Payer: Self-pay | Admitting: Internal Medicine

## 2024-02-20 DIAGNOSIS — E785 Hyperlipidemia, unspecified: Secondary | ICD-10-CM

## 2024-02-23 ENCOUNTER — Encounter (INDEPENDENT_AMBULATORY_CARE_PROVIDER_SITE_OTHER): Admitting: Ophthalmology

## 2024-02-23 DIAGNOSIS — H35372 Puckering of macula, left eye: Secondary | ICD-10-CM | POA: Diagnosis not present

## 2024-02-23 DIAGNOSIS — H43813 Vitreous degeneration, bilateral: Secondary | ICD-10-CM | POA: Diagnosis not present

## 2024-02-23 DIAGNOSIS — H26492 Other secondary cataract, left eye: Secondary | ICD-10-CM

## 2024-02-23 DIAGNOSIS — H353131 Nonexudative age-related macular degeneration, bilateral, early dry stage: Secondary | ICD-10-CM

## 2024-02-24 ENCOUNTER — Encounter (INDEPENDENT_AMBULATORY_CARE_PROVIDER_SITE_OTHER): Payer: Self-pay | Admitting: Otolaryngology

## 2024-02-24 ENCOUNTER — Ambulatory Visit (INDEPENDENT_AMBULATORY_CARE_PROVIDER_SITE_OTHER): Payer: Medicare Other | Admitting: Otolaryngology

## 2024-02-24 VITALS — BP 106/71 | HR 98

## 2024-02-24 DIAGNOSIS — R0981 Nasal congestion: Secondary | ICD-10-CM

## 2024-02-24 DIAGNOSIS — R0982 Postnasal drip: Secondary | ICD-10-CM

## 2024-02-24 DIAGNOSIS — J3089 Other allergic rhinitis: Secondary | ICD-10-CM

## 2024-02-24 DIAGNOSIS — K219 Gastro-esophageal reflux disease without esophagitis: Secondary | ICD-10-CM | POA: Diagnosis not present

## 2024-02-24 DIAGNOSIS — R1319 Other dysphagia: Secondary | ICD-10-CM

## 2024-02-24 MED ORDER — FAMOTIDINE 20 MG PO TABS: 20.0000 mg | ORAL_TABLET | Freq: Two times a day (BID) | ORAL | 3 refills | Status: AC

## 2024-02-24 MED ORDER — LORATADINE 10 MG PO TABS: 10.0000 mg | ORAL_TABLET | Freq: Every day | ORAL | 11 refills | Status: AC

## 2024-02-24 NOTE — Progress Notes (Signed)
 ENT Progress Note:   Update 02/24/2024  Discussed the use of AI scribe software for clinical note transcription with the patient, who gave verbal consent to proceed.  History of Present Illness Edward Curtis "Edward Curtis" is a 68 year old male who presents with swallowing difficulties and reflux symptoms.  He has experienced swallowing difficulties and reflux symptoms. His previous throat pain resolved after starting a reflux medication, which he believes has been effective. He identified a correlation between burning mouth symptoms and the use of Nutrisystem diet, noting that the symptoms began while on the diet and resolved after discontinuing it. Upon reintroducing Nutrisystem, the symptoms returned, leading him to stop the diet again, which alleviated the symptoms.  A swallow test showed normal oropharyngeal swallowing but revealed a small hiatal hernia and esophageal dysmotility. He describes a sensation of food being 'stuck' after eating quickly or consuming large amounts, which improves with walking or burping. He has adjusted his eating habits, consuming heavier meals at lunch and lighter meals earlier in the evening to mitigate reflux symptoms, which worsen when eating late at night.  He has switched from Zyrtec to Claritin for allergy management, preferring it due to fewer side effects. However, he has temporarily stopped all antihistamines in preparation for an upcoming allergy test. He continues to take Pepcid , noting that symptoms return if he discontinues it.  His previous ear and upper head pain have resolved, which he attributes to changes in his diet and medication regimen.        Records Reviewed:  Initial Evaluation  Reason for Consult: chronic sore throat and mouth burning worse on the left side  HPI: Discussed the use of AI scribe software for clinical note transcription with the patient, who gave verbal consent to proceed.  History of Present Illness   The patient is a 40  yoM initially presented with a strep throat infection around Halloween, which was treated with a Z-Pak and doxycycline  due to a penicillin allergy. Following the treatment, the patient reported persistent sore throat, predominantly on the left side, along with oral discomfort including a sore and tingly tongue, gums, and roof of the mouth. The patient also reported hair loss and left-sided ear pain.  Despite a negative blood test, the patient was put on Valtrex due to a suspected herpes infection. The patient reported fluctuating symptoms, with periods of improvement followed by worsening. The patient eventually discontinued the Valtrex. An antifungal medication, Fluconazole , was also tried but discontinued after five days due to lack of improvement. A nystatin  rinse was prescribed but was not tolerated due to an allergic reaction.  The patient reported a history of allergies and is currently managed with Zyrtec as needed. The patient also reported a family history of difficulty swallowing, which they also experience intermittently, particularly when consuming dry foods. The patient noted that stress seems to exacerbate this symptom. The patient occasionally takes Pepcid  for reflux, which provides some relief.  The patient also reported a significant weight loss of 45 pounds over the summer due to dieting, followed by a rapid weight gain after resuming regular eating habits. The patient recalled a severe episode of reflux after this weight gain, which preceded the onset of the current symptoms. The patient has recently started taking probiotics and has noticed some improvement in symptoms over the past week.     Records Reviewed:  Family Medicine Office Visit 10/07/23 Dr Alyne Babinski Here to follow up on soreness in the left side of his mouth and to discuss recent  lab results. He tested negative for HIV. The Ig G levels for both CMV and EBV were elevated with normal IgM levels. Since our last visit he has been  taking  single dose of 1000 mg Valacyclovir a day, and he says he has felt better, though the mouth soreness is still present.  His throat and mouth irritation could be due to a herpetic infection, so I advised him to increase the Valacyclovir to 1000 mg TID for the next week. He is scheduled to see ENT on 10-28-23.    Past Medical History:  Diagnosis Date   Allergy    seasonal allergies    Arthritis    Asthma    Blood transfusion without reported diagnosis 10/26/1983   Calcium  oxalate renal stones    early 1990's   Cataract    Chicken pox    Chronic neck pain    Depression    GERD (gastroesophageal reflux disease)    Hyperlipidemia 10/10/2013   Hypertrophic obstructive cardiomyopathy(425.11) 10/10/2013   IBS (irritable bowel syndrome) 12/07/2011   Migraines    Neuromuscular disorder (HCC)    radiculopathy left side    Obesity (BMI 30-39.9) 10/10/2013   Seasonal allergies     Past Surgical History:  Procedure Laterality Date   CATARACT EXTRACTION Left 10/2020   CHOLECYSTECTOMY N/A 06/19/2021   Procedure: LAPAROSCOPIC CHOLECYSTECTOMY WITH POSSIBLE INTRAOPERATIVE CHOLANGIOGRAM;  Surgeon: Adalberto Acton, MD;  Location: [facility];  Service: General;  Laterality: N/A;   CORONARY STENT INTERVENTION N/A 03/23/2019   Procedure: CORONARY STENT INTERVENTION;  Surgeon: Arleen Lacer, MD;  Location: [facility] INVASIVE CV LAB;  Service: Cardiovascular;  Laterality: N/A;   LEFT HEART CATH AND CORONARY ANGIOGRAPHY N/A 03/23/2019   Procedure: LEFT HEART CATH AND CORONARY ANGIOGRAPHY;  Surgeon: Arleen Lacer, MD;  Location: [facility] INVASIVE CV LAB;  Service: Cardiovascular;  Laterality: N/A;   left knee surgery  1990   MOUTH SURGERY  1975/1993   pre cancerous mole  2013   prostate procedure  1980   umbilical cyst  1985    Family History  Problem Relation Age of Onset   Arthritis Mother    Hyperlipidemia Mother    Heart disease Mother    Hypertension Mother    Heart disease Father     Hypertension Father    Diabetes Father    Arthritis Paternal Grandmother    Diabetes Paternal Grandmother    Colon cancer Paternal Grandfather    Heart disease Paternal Grandfather    Diabetes Paternal Grandfather    Hyperlipidemia Maternal Grandmother    Hypertension Other        both sets of parents   Esophageal cancer Neg Hx    Rectal cancer Neg Hx    Stomach cancer Neg Hx     Social History:  reports that he has never smoked. He has never used smokeless tobacco. He reports current alcohol use of about 1.0 standard drink of alcohol per week. He reports that he does not use drugs.  Allergies:  Allergies  Allergen Reactions   Ciprofloxacin  Other (See Comments)    Possible "leg swelling, pain, unable to walk"   Iodinated Contrast Media Anaphylaxis, Hives, Itching and Swelling    Patient must be premedicated with 13 hour protocol prior to IV contrast administration   Penicillins Hives    Did it involve swelling of the face/tongue/throat, SOB, or low BP? No Did it involve sudden or severe rash/hives, skin peeling, or any reaction on the inside of your  mouth or nose? Yes Did you need to seek medical attention at a hospital or doctor's office? Yes When did it last happen?      25 + years ago If all above answers are "NO", may proceed with cephalosporin use.    Yellow Jacket Venom Anaphylaxis    Swelling    Atorvastatin      myaglias   Clindamycin/Lincomycin Diarrhea and Nausea Only   Erythromycin Diarrhea and Nausea Only   Ibuprofen Other (See Comments)    Causes tachycardia   Keflex [Cephalexin]     Rash hives   Other Itching and Other (See Comments)     Walnut trees and walnuts , sores in mouth   Rosuvastatin      myaligias   Sulfa Antibiotics Hives   Latex Rash    Developed rash 06/2017 that progressed.  Allergy testing positive for latex allergy.  Later determined rash was 2/2 latex in Nutrisystem foods.    Medications: I have reviewed the patient's current  medications.  The PMH, PSH, Medications, Allergies, and SH were reviewed and updated.  ROS: Constitutional: Negative for fever, weight loss and weight gain. Cardiovascular: Negative for chest pain and dyspnea on exertion. Respiratory: Is not experiencing shortness of breath at rest. Gastrointestinal: Negative for nausea and vomiting. Neurological: Negative for headaches. Psychiatric: The patient is not nervous/anxious  Blood pressure 106/71, pulse 98, SpO2 94%.  PHYSICAL EXAM:  Exam: General: Well-developed, well-nourished Communication and Voice: Slightly raspy Respiratory Respiratory effort: Equal inspiration and expiration without stridor Cardiovascular Peripheral Vascular: Warm extremities with equal color/perfusion Eyes: No nystagmus with equal extraocular motion bilaterally Neuro/Psych/Balance: Patient oriented to person, place, and time; Appropriate mood and affect; Gait is intact with no imbalance; Cranial nerves I-XII are intact Head and Face Inspection: Normocephalic and atraumatic without mass or lesion Palpation: Facial skeleton intact without bony stepoffs Salivary Glands: No mass or tenderness Facial Strength: Facial motility symmetric and full bilaterally ENT Pinna: External ear intact and fully developed External canal: Canal is patent with intact skin Tympanic Membrane: Clear and mobile External Nose: No scar or anatomic deformity Internal Nose: Septum is deviated to the left. No polyp, or purulence. Mucosal edema and erythema present.  Bilateral inferior turbinate hypertrophy.  Lips, Teeth, and gums: Mucosa and teeth intact and viable TMJ: No pain to palpation with full mobility Oral cavity/oropharynx: No erythema or exudate, no lesions present Nasopharynx: No mass or lesion with intact mucosa Hypopharynx: Intact mucosa without pooling of secretions Larynx Glottic: Full true vocal cord mobility without lesion or mass Supraglottic: Normal appearing  epiglottis and AE folds Interarytenoid Space: Moderate pachydermia&edema Subglottic Space: Patent without lesion or edema Neck Neck and Trachea: Midline trachea without mass or lesion Thyroid : No mass or nodularity Lymphatics: No lymphadenopathy  Procedure: Preoperative diagnosis: chronic sore throat and burning in the mouth, left sided throat and ear discomfort  Postoperative diagnosis:   Same  Procedure: Flexible fiberoptic laryngoscopy  Surgeon: Artice Last, MD  Anesthesia: Topical lidocaine  and Afrin Complications: None Condition is stable throughout exam  Indications and consent:  The patient presents to the clinic with Indirect laryngoscopy view was incomplete. Thus it was recommended that they undergo a flexible fiberoptic laryngoscopy. All of the risks, benefits, and potential complications were reviewed with the patient preoperatively and verbal informed consent was obtained.  Procedure: The patient was seated upright in the clinic. Topical lidocaine  and Afrin were applied to the nasal cavity. After adequate anesthesia had occurred, I then proceeded to pass the flexible telescope into  the nasal cavity. The nasal cavity was patent without rhinorrhea or polyp. The nasopharynx was also patent without mass or lesion. The base of tongue was visualized and was normal. There were no signs of pooling of secretions in the piriform sinuses. The true vocal folds were mobile bilaterally. There were no signs of glottic or supraglottic mucosal lesion or mass. There was moderate interarytenoid pachydermia and post cricoid edema. The telescope was then slowly withdrawn and the patient tolerated the procedure throughout.    PROCEDURE NOTE: nasal endoscopy  Preoperative diagnosis: chronic sinusitis symptoms  Postoperative diagnosis: same  Procedure: Diagnostic nasal endoscopy (95621)  Surgeon: Artice Last, M.D.  Anesthesia: Topical lidocaine  and Afrin  H&P REVIEW: The patient's  history and physical were reviewed today prior to procedure. All medications were reviewed and updated as well. Complications: None Condition is stable throughout exam Indications and consent: The patient presents with symptoms of chronic sinusitis not responding to previous therapies. All the risks, benefits, and potential complications were reviewed with the patient preoperatively and informed consent was obtained. The time out was completed with confirmation of the correct procedure.   Procedure: The patient was seated upright in the clinic. Topical lidocaine  and Afrin were applied to the nasal cavity. After adequate anesthesia had occurred, the rigid nasal endoscope was passed into the nasal cavity. The nasal mucosa, turbinates, septum, and sinus drainage pathways were visualized bilaterally. This revealed no purulence or significant secretions that might be cultured. There were no polyps or sites of significant inflammation. The mucosa was intact and there was no crusting present. The scope was then slowly withdrawn and the patient tolerated the procedure well. There were no complications or blood loss.  Studies Reviewed: CXR 1 View 06/18/21 FINDINGS: The heart size and mediastinal contours are within normal limits. Both lungs are clear. The visualized skeletal structures are unremarkable.   IMPRESSION: No acute abnormality noted.  MBS 12/30/23 Pt presents with normal oropharyngeal swallowing with thorough mastication and good bolus control, intermittent high penetration of liquids into larynx- Penetration/Aspiration Scale Score of 2, considered WFL. There was no aspiration. There was complete bolus clearance from the pharynx. No pill was administered due to subsequent esophagram with admin of pill. No dysphagia identified. Continue current diet; consider diet modifications to minimize reflux, which was discussed with pt. No SLP f/u needed   Esophagram 12/30/23 IMPRESSION: 1.  Small hiatal  hernia   2. Mild to moderate esophageal dysmotility with tertiary contractions as can be seen with presbyesophagus  Assessment/Plan: Encounter Diagnoses  Name Primary?   Esophageal dysphagia Yes   Chronic GERD    Environmental and seasonal allergies    Post-nasal drip      Assessment and Plan   Chronic sore throat worse on the left side Exam without masses or lesions, flexible laryngoscopy and bilateral nasal endoscopy performed today with evidence of GERD LPR and nasal congestion but no polyps or pus and no evidence of thrush.  We discussed that this could be due to burning mouth syndrome versus chronic inflammation from GERD LPR.  Burning Mouth Syndrome Symptoms include sore throat, tingly mouth, and tongue since a strep infection treated with antibiotics and antivirals. Strep culture was negative at the time. Possible contributing factors include low B12 levels (on supplementation right now) and changes in oral microbiome. Differential diagnosis includes vitamin deficiencies and microbiome disruption. Discussed potential treatments including B12 supplementation, alpha lipoic acid, lysine, vitamin E oil, and capsaicin cream application to help with sx. Explained that capsaicin cream  may help by desensitizing the mouth to the burning sensation. - Continue B12 supplementation, 1000 mcg daily - Consider alpha lipoic acid supplementation - Consider lysine or vitamin E oil supplementation - If symptoms persist, consider capsaicin cream application -Detailed handout on burning mouth syndrome causes and treatment options was provided  Gastroesophageal Reflux Disease (GERD) Symptoms include throat irritation, sensation of a lump, and history of severe reflux episode. Symptoms may contribute to chronic sore throat and mouth issues. Discussed potential benefits of famotidine  and Reflux Gourmet supplement to reduce reflux symptoms and improve throat irritation. - Prescribe famotidine  20 mg twice  daily for 30 days - Recommend Reflux Gourmet supplement after meals -Diet and lifestyle changes to minimize GERD  Dysphagia Intermittent difficulty swallowing solid foods, particularly dry foods. No issues with liquids. Family history of similar symptoms. Symptoms may be exacerbated by stress. Discussed the need for a swallow study to further evaluate swallowing difficulties. - Order barium swallow study MBS and esophagram  Allergic Rhinitis, postnasal drainage Allergies managed with Zyrtec as needed. No regular use of nasal sprays  - Continue Zyrtec 10 mg daily   Follow-up - Schedule follow-up appointment in a few months to discuss swallow study results  -I advised him to discuss hair loss with his PCP     Update 02/24/2024 Assessment and Plan Assessment & Plan Gastroesophageal reflux disease (GERD) LPR GERD symptoms improved with Pepcid . Symptoms exacerbated by late meals and large portions. Discussed potential rebound symptoms if Pepcid  is discontinued abruptly. Discussed trial of reflux gourmet - Continue Pepcid  (Rx sent) - Recommend seaweed supplement after large meals, especially at night. - diet and lifestyle changes - Refer to GI for upper endoscopy.  Chronic sore throat discomfort left side - resolved - feels this is due to him discontinuing NutriSystem diet.  - monitor for recurrence   Esophageal dysmotility/Dysphagia  Esophageal dysmotility  and small hiatal hernia on esophagram, MBS with normal swallow. We discussed findings. Symptoms managed by eating slowly and monitoring portion sizes. Further evaluation by GI is warranted for potential EGD 2/2 findings and persistent reflux to rule out Barrett's. He never had EGD in the past.  - Refer to GI for further evaluation.  Chronic nasal congestion and post-nasal drainage environmental allergies  - allergy has allergist, has upcoming allergy testing  - he is currently on Zyrtec and we discussed that he can continue for  now - proceed with allergy testing    I spent 30 minutes in total face-to-face time and in reviewing records during pre-charting, more than 50% of which was spent in counseling and coordination of care, reviewing test results, reviewing medications and treatment regimen and/or in discussing or reviewing the diagnosis, the prognosis and treatment options. Pertinent laboratory and imaging test results that were available during this visit with the patient were reviewed by me and considered in my medical decision making (see chart for details).    Artice Last, MD Otolaryngology [facility] Phone: [phone] Fax: [phone]    02/24/2024, 8:54 AM

## 2024-02-24 NOTE — Patient Instructions (Signed)

## 2024-03-26 ENCOUNTER — Other Ambulatory Visit: Payer: Self-pay

## 2024-03-26 ENCOUNTER — Other Ambulatory Visit (HOSPITAL_COMMUNITY): Payer: Self-pay

## 2024-03-26 ENCOUNTER — Telehealth: Payer: Self-pay | Admitting: Pharmacy Technician

## 2024-03-26 MED ORDER — EZETIMIBE 10 MG PO TABS: 10.0000 mg | ORAL_TABLET | Freq: Every day | ORAL | 1 refills | Status: DC

## 2024-03-26 NOTE — Telephone Encounter (Signed)
 Pharmacy Patient Advocate Encounter   Received notification from CoverMyMeds that prior authorization for REPATHA  is required/requested.   Insurance verification completed.   The patient is insured through [employer] .   Per test claim: PA required; PA submitted to above mentioned insurance via CoverMyMeds Key/confirmation #/EOC [id number] Status is pending

## 2024-03-26 NOTE — Telephone Encounter (Signed)
 Pharmacy Patient Advocate Encounter  Received notification from [employer] [facility] that Prior Authorization for repatha  has been APPROVED from 12/27/23 to 03/26/25. Spoke to pharmacy to process.Copay is $15.00.    PA #/Case ID/Reference #: [id number]

## 2024-03-27 ENCOUNTER — Encounter: Payer: Self-pay | Admitting: Otolaryngology

## 2024-03-29 ENCOUNTER — Encounter: Payer: Self-pay | Admitting: Physician Assistant

## 2024-04-03 LAB — LIPID PANEL
Chol/HDL Ratio: 2.8 ratio (ref 0.0–5.0)
Cholesterol, Total: 108 mg/dL (ref 100–199)
HDL: 38 mg/dL — ABNORMAL LOW (ref >39)
LDL Chol Calc (NIH): 40 mg/dL (ref 0–99)
Triglycerides: 181 mg/dL — ABNORMAL HIGH (ref 0–149)
VLDL Cholesterol Cal: 30 mg/dL (ref 5–40)

## 2024-04-06 ENCOUNTER — Other Ambulatory Visit (HOSPITAL_COMMUNITY): Payer: Medicare Other

## 2024-04-09 ENCOUNTER — Ambulatory Visit (HOSPITAL_COMMUNITY)
Admission: RE | Admit: 2024-04-09 | Discharge: 2024-04-09 | Disposition: A | Discharge status: Home / Self Care | Source: Ambulatory Visit | Attending: Cardiology | Admitting: Cardiology

## 2024-04-09 DIAGNOSIS — I517 Cardiomegaly: Secondary | ICD-10-CM | POA: Diagnosis not present

## 2024-04-09 DIAGNOSIS — I77810 Thoracic aortic ectasia: Secondary | ICD-10-CM

## 2024-04-09 LAB — ECHOCARDIOGRAM COMPLETE
Area-P 1/2: 3.56 cm2
S' Lateral: 2.29 cm

## 2024-04-12 ENCOUNTER — Ambulatory Visit (HOSPITAL_BASED_OUTPATIENT_CLINIC_OR_DEPARTMENT_OTHER): Payer: Self-pay | Admitting: Internal Medicine

## 2024-04-13 ENCOUNTER — Ambulatory Visit: Payer: Medicare Other | Attending: Internal Medicine | Admitting: Internal Medicine

## 2024-04-13 ENCOUNTER — Encounter: Payer: Self-pay | Admitting: Internal Medicine

## 2024-04-13 VITALS — BP 111/73 | HR 103 | Ht 68.0 in | Wt 229.2 lb

## 2024-04-13 DIAGNOSIS — I77810 Thoracic aortic ectasia: Secondary | ICD-10-CM

## 2024-04-13 DIAGNOSIS — I10 Essential (primary) hypertension: Secondary | ICD-10-CM | POA: Diagnosis present

## 2024-04-13 DIAGNOSIS — I25119 Atherosclerotic heart disease of native coronary artery with unspecified angina pectoris: Secondary | ICD-10-CM | POA: Diagnosis present

## 2024-04-13 DIAGNOSIS — E785 Hyperlipidemia, unspecified: Secondary | ICD-10-CM | POA: Diagnosis present

## 2024-04-13 MED ORDER — TADALAFIL 5 MG PO TABS
5.0000 mg | ORAL_TABLET | ORAL | 1 refills | Status: AC
Start: 2024-04-13 — End: ?

## 2024-04-13 MED ORDER — NITROGLYCERIN 0.4 MG SL SUBL: 0.4000 mg | SUBLINGUAL_TABLET | SUBLINGUAL | 3 refills | Status: AC | PRN

## 2024-04-13 NOTE — Patient Instructions (Signed)
 Medication Instructions:  Your physician recommends that you continue on your current medications as directed. Please refer to the Current Medication list given to you today.  *If you need a refill on your cardiac medications before your next appointment, please call your pharmacy*  Testing/Procedures:  Your physician has requested that you have an echocardiogram. Echocardiography is a painless test that uses sound waves to create images of your heart. It provides your doctor with information about the size and shape of your heart and how well your heart's chambers and valves are working. This procedure takes approximately one hour. There are no restrictions for this procedure. Please do NOT wear cologne, perfume, aftershave, or lotions (deodorant is allowed). Please arrive 15 minutes prior to your appointment time.  Follow-Up: At [facility], you and your health needs are our priority.  As part of our continuing mission to provide you with exceptional heart care, our providers are all part of one team.  This team includes your primary Cardiologist (physician) and Advanced Practice Providers or APPs (Physician Assistants and Nurse Practitioners) who all work together to provide you with the care you need, when you need it.  Your next appointment:   1 year with Dr. Maximo Spar

## 2024-04-13 NOTE — Progress Notes (Signed)
 LIPID CLINIC CONSULT NOTE  Chief Complaint:  Follow-up dyslipidemia  Primary Care Physician: Edward Furth, MD  Primary Cardiologist:  Edward Hinds, MD  HPI:  Edward Curtis is a 68 y.o. male who is being seen today for the evaluation of dyslipidemia at the request of Edward Furth, MD. This is a 68 year old male kindly referred by Edward Curtis for evaluation and management of dyslipidemia.  He previously seen Edward Curtis, PharmD in our lipid clinic and was started on Repatha .  This was following several medication changes including a number of statins both high and low potency that he did not tolerate as well as adding ezetimibe .  He said he had only been on ezetimibe  for a couple of weeks and then started on Repatha .  He has a history of low back pain and also has a history of coronary disease with a stent in 2020.  He was previously followed by Dr. Anastasia Curtis.  After giving himself the first injection of Repatha  he reported low back pain which worsened over the course of about a week and eventually improved.  He felt that this could be related since he looked up side effects of the medicine which included back pain and then did not pursue any more doses of the medication.  Prior to starting the medicine in July his total cholesterol 217, triglycerides 220 HDL 30 and LDL of 147 with a target LDL of less than 70 given his coronary disease history.  12/31/2020  Edward Curtis returns today for follow-up.  He initially thought that he had side effects with the Repatha  causing back pain.  It might be because he said he continued to have some back pain issues however it seemed that it seem to be getting better and better after every dose.  The last dose he noted no side effects from it.  His cholesterol has responded significantly.  His total is now 95, triglycerides 170, HDL 35 and LDL 32 (decreased from 147).  He remains on ezetimibe .  He is concerned because he is recently gained about 50 pounds.  He is now  scheduled to start a commercial weight loss program with weight watchers.  He is hoping to get that weight down in about 6 months.  07/21/2021  Edward Curtis returns today for follow-up of dyslipidemia.  Overall he seems to be doing well on his current therapies.  His cholesterol is gone up slightly with LDL now at 48 up from 32.  Triglycerides are lower at 155 and total cholesterol is risen slightly to 109.  Overall he is tolerating the medicines well.  He has no complaints.  He denies any chest pain or worsening shortness of breath.  He had seen Edward Curtis earlier in the year who indicated that he may be retiring soon and that Edward Curtis may wish to speak with me about switching providers to me.  08/24/2022  Edward Curtis is seen today in follow-up.  He reports feeling well.  Denies any chest pain or worsening shortness of breath.  Unfortunately he has had some weight gain.  Despite this his lipids are improved further.  He remains on Repatha  and ezetimibe .  Total cholesterol 91, triglycerides 168, HDL 35 and LDL 28.  Blood pressure is well controlled at 126/78.  Is on low-dose aspirin .  He says he is been less physically active recently and really wants to work to try to get the weight down and be more active.  He has had some issues  with his back and we talked about stretching exercises and other things he could do to help with this.  10/14/2023  Edward Curtis is seen today in follow-up.  Overall he is doing well.  He said he had a couple of illnesses recently including strep throat and probably herpetic stomatitis.  Other than that he has had some weight loss but has recently gained some weight back.  Blood pressure is well-controlled today.  He had an echo this summer which showed normal LVEF but did show a dilated ascending aorta to 40 mm.  This will require follow-up.  He denies any anginal symptoms.  04/13/2024   Edward Curtis is seen today for follow-up.  He had a recent echo to reassess his dilated aorta.  This  demonstrated normal LVEF with borderline dilated aorta to 38 mm, smaller than it had previously been measured.  He had recent lipid testing which showed total Edward Curtis 108, triglycerides 181, HDL 38 and LDL 40.  He is compliant with his medications although diet could be somewhat improved.  He denies any chest pain worsening shortness of breath.  He has as needed nitroglycerin  but the prescription is expired.  He reports taking every other day Cialis  and understands that he should be cautious with using nitrates and Cialis  together.   PMHx:  Past Medical History:  Diagnosis Date   Allergy    seasonal allergies    Arthritis    Asthma    Blood transfusion without reported diagnosis 10/26/1983   Calcium  oxalate renal stones    early 1990's   Cataract    Chicken pox    Chronic neck pain    Depression    GERD (gastroesophageal reflux disease)    Hyperlipidemia 10/10/2013   Hypertrophic obstructive cardiomyopathy(425.11) 10/10/2013   IBS (irritable bowel syndrome) 12/07/2011   Migraines    Neuromuscular disorder (HCC)    radiculopathy left side    Obesity (BMI 30-39.9) 10/10/2013   Seasonal allergies     Past Surgical History:  Procedure Laterality Date   CATARACT EXTRACTION Left 10/2020   CHOLECYSTECTOMY N/A 06/19/2021   Procedure: LAPAROSCOPIC CHOLECYSTECTOMY WITH POSSIBLE INTRAOPERATIVE CHOLANGIOGRAM;  Surgeon: Adalberto Acton, MD;  Location: [facility];  Service: General;  Laterality: N/A;   CORONARY STENT INTERVENTION N/A 03/23/2019   Procedure: CORONARY STENT INTERVENTION;  Surgeon: Arleen Lacer, MD;  Location: [facility] INVASIVE CV LAB;  Service: Cardiovascular;  Laterality: N/A;   LEFT HEART CATH AND CORONARY ANGIOGRAPHY N/A 03/23/2019   Procedure: LEFT HEART CATH AND CORONARY ANGIOGRAPHY;  Surgeon: Arleen Lacer, MD;  Location: [facility] INVASIVE CV LAB;  Service: Cardiovascular;  Laterality: N/A;   left knee surgery  1990   MOUTH SURGERY  1975/1993   pre cancerous mole  2013   prostate  procedure  1980   umbilical cyst  1985    FAMHx:  Family History  Problem Relation Age of Onset   Arthritis Mother    Hyperlipidemia Mother    Heart disease Mother    Hypertension Mother    Heart disease Father    Hypertension Father    Diabetes Father    Arthritis Paternal Grandmother    Diabetes Paternal Grandmother    Colon cancer Paternal Grandfather    Heart disease Paternal Grandfather    Diabetes Paternal Grandfather    Hyperlipidemia Maternal Grandmother    Hypertension Other        both sets of parents   Esophageal cancer Neg Hx    Rectal cancer Neg  Hx    Stomach cancer Neg Hx     SOCHx:   reports that he has never smoked. He has never used smokeless tobacco. He reports current alcohol use of about 1.0 standard drink of alcohol per week. He reports that he does not use drugs.  ALLERGIES:  Allergies  Allergen Reactions   Ciprofloxacin  Other (See Comments)    Possible leg swelling, pain, unable to walk   Iodinated Contrast Media Anaphylaxis, Hives, Itching and Swelling    Patient must be premedicated with 13 hour protocol prior to IV contrast administration   Penicillins Hives    Did it involve swelling of the face/tongue/throat, SOB, or low BP? No Did it involve sudden or severe rash/hives, skin peeling, or any reaction on the inside of your mouth or nose? Yes Did you need to seek medical attention at a hospital or doctor's office? Yes When did it last happen?      25 + years ago If all above answers are "NO", may proceed with cephalosporin use.    Yellow Jacket Venom Anaphylaxis    Swelling    Atorvastatin      myaglias   Clindamycin/Lincomycin Diarrhea and Nausea Only   Erythromycin Diarrhea and Nausea Only   Ibuprofen Other (See Comments)    Causes tachycardia   Keflex [Cephalexin]     Rash hives   Other Itching and Other (See Comments)     Walnut trees and walnuts , sores in mouth   Rosuvastatin      myaligias   Sulfa Antibiotics Hives   Latex  Rash    Developed rash 06/2017 that progressed.  Allergy testing positive for latex allergy.  Later determined rash was 2/2 latex in [employer] foods.    ROS: Pertinent items noted in HPI and remainder of comprehensive ROS otherwise negative.  HOME MEDS: Current Outpatient Medications on File Prior to Visit  Medication Sig Dispense Refill   aspirin  EC 81 MG tablet Take 81 mg by mouth daily.     Cyanocobalamin  (VITAMIN B 12) 500 MCG TABS Take 500 mcg by mouth daily.     diphenhydrAMINE  (BENADRYL ) 25 MG tablet Take 25 mg by mouth daily as needed (allergic reactions).     EPINEPHrine  (EPIPEN  2-PAK) 0.3 mg/0.3 mL IJ SOAJ injection Inject 0.3 mLs (0.3 mg total) into the muscle once as needed (for severe allergic reaction). CAll 911 immediately if you have to use this medicine 1 each 1   Evolocumab  (REPATHA  SURECLICK) 140 MG/ML SOAJ INJECT 140MG  INTO THE SKIN EVERY 14 DAYS 2 mL 11   ezetimibe  (ZETIA ) 10 MG tablet Take 1 tablet (10 mg total) by mouth daily. 90 tablet 1   famotidine  (PEPCID ) 20 MG tablet Take 1 tablet (20 mg total) by mouth 2 (two) times daily. 30 tablet 3   ketoconazole (NIZORAL) 2 % cream Apply topically daily.     loratadine  (CLARITIN ) 10 MG tablet Take 1 tablet (10 mg total) by mouth daily. 30 tablet 11   Melatonin 1 MG CHEW Chew 1 mg by mouth daily.     nitroGLYCERIN  (NITROSTAT ) 0.4 MG SL tablet ONE TABLET UNDER TONGUE EVERY 5 MINUTES AS NEEDED FOR CHEST PAIN 25 tablet 3   Polyethylene Glycol 3350  (MIRALAX  PO) Take by mouth.     tadalafil  (CIALIS ) 5 MG tablet Take 1 tablet (5 mg total) by mouth daily as needed for erectile dysfunction. 10 tablet 1   valACYclovir (VALTREX) 1000 MG tablet Take 1,000 mg by mouth daily as needed.  verapamil  (CALAN ) 80 MG tablet TAKE ONE TABLET DAILY AS NEEDED 30 tablet 1   verapamil  (CALAN -SR) 180 MG CR tablet Take 1 tablet (180 mg total) by mouth daily. 90 tablet 3   No current facility-administered medications on file prior to visit.     LABS/IMAGING: No results found for this or any previous visit (from the past 48 hours). No results found.  LIPID PANEL:    Component Value Date/Time   CHOL 108 04/03/2024 0810   TRIG 181 (H) 04/03/2024 0810   HDL 38 (L) 04/03/2024 0810   CHOLHDL 2.8 04/03/2024 0810   CHOLHDL 3 06/04/2021 0849   VLDL 32.6 06/04/2021 0849   LDLCALC 40 04/03/2024 0810   LDLDIRECT 151.2 09/04/2014 0926    WEIGHTS: Wt Readings from Last 3 Encounters:  04/13/24 229 lb 3.2 oz (104 kg)  10/28/23 200 lb (90.7 kg)  10/14/23 199 lb 3.2 oz (90.4 kg)    VITALS: BP 111/73 (BP Location: Left Arm, Patient Position: Sitting)   Pulse (!) 103   Ht 5' 8 (1.727 m)   Wt 229 lb 3.2 oz (104 kg)   SpO2 98%   BMI 34.85 kg/m   EXAM: General appearance: alert and no distress Lungs: clear to auscultation bilaterally Heart: regular rate and rhythm, S1, S2 normal, no murmur, click, rub or gallop Extremities: extremities normal, atraumatic, no cyanosis or edema Neurologic: Grossly normal  EKG: Deferred  ASSESSMENT: CAD status post PCI in 2020 Mixed dyslipidemia, goal LDL less than 70 RBBB Statin intolerance-myalgias Chronic low back pain Hypertension Dilated aorta to 40 mm, repeat 38 mm (03/2024)  PLAN: 1.   Edward Curtis is doing well without any recurrent chest pain.  His repeat aortic measurement by echo is now 38 mm.  He has a contrast dye allergy therefore CT is not the best option to evaluate this and reports a history of significant claustrophobia with MRI in the past.  If there is worsening aortic dilatation by echo then we will consider 1 of those modalities.  Will plan otherwise to repeat an echo next year.  I will provide as needed nitroglycerin .  He should use it cautiously with the tadalafil .  Follow-up annually or sooner as necessary.  Hazle Lites, MD, [facility], FNLA, FACP  [place]  [facility]  Medical Director of the [facility] Lipid Disorders &  Cardiovascular Risk Reduction  Clinic Diplomate of the American Board of Clinical Lipidology Attending Cardiologist  Direct Dial: [phone]  Fax: [phone]  Website:  [email or web address]   Aviva Lemmings Otisha Spickler 04/13/2024, 9:15 AM

## 2024-04-17 ENCOUNTER — Encounter: Payer: Self-pay | Admitting: Family Medicine

## 2024-04-17 ENCOUNTER — Ambulatory Visit (INDEPENDENT_AMBULATORY_CARE_PROVIDER_SITE_OTHER): Admitting: Family Medicine

## 2024-04-17 VITALS — BP 124/74 | HR 86 | Temp 98.0°F | Wt 228.0 lb

## 2024-04-17 DIAGNOSIS — F40243 Fear of flying: Secondary | ICD-10-CM

## 2024-04-17 DIAGNOSIS — H6993 Unspecified Eustachian tube disorder, bilateral: Secondary | ICD-10-CM

## 2024-04-17 MED ORDER — METHYLPREDNISOLONE ACETATE 40 MG/ML IJ SUSP
40.0000 mg | Freq: Once | INTRAMUSCULAR | Status: AC
  Administered 2024-04-17: 40 mg via INTRAMUSCULAR

## 2024-04-17 MED ORDER — METHYLPREDNISOLONE ACETATE 80 MG/ML IJ SUSP
80.0000 mg | Freq: Once | INTRAMUSCULAR | Status: AC
  Administered 2024-04-17: 80 mg via INTRAMUSCULAR

## 2024-04-17 MED ORDER — ONDANSETRON HCL 8 MG PO TABS: 8.0000 mg | ORAL_TABLET | Freq: Three times a day (TID) | ORAL | 0 refills | Status: DC | PRN

## 2024-04-17 MED ORDER — LORAZEPAM 0.5 MG PO TABS
0.5000 mg | ORAL_TABLET | Freq: Three times a day (TID) | ORAL | 0 refills | Status: AC | PRN
Start: 2024-04-17 — End: ?

## 2024-04-17 NOTE — Addendum Note (Signed)
 Addended by: LADONNA INOCENTE SAILOR on: 04/17/2024 03:41 PM   Modules accepted: Orders

## 2024-04-17 NOTE — Progress Notes (Signed)
   Subjective:    Patient ID: Edward Curtis, male    DOB: 1956-10-24, 68 y.o.   MRN: [id number]  HPI Here for several issues. First for the past 4 days he has had pressure and mild pain in the right ear, and he has noticed clicking in both ears. No fever or ST or cough. He is worried because he will be flying to [place]  later this week, and he doesn't want to be sick. Also he asks for something to treat anxiety and nausea because he gets extremely anxious on planes.    Review of Systems  Constitutional: Negative.   HENT:  Positive for congestion, ear pain and sinus pressure. Negative for hearing loss, postnasal drip and sore throat.   Eyes: Negative.   Respiratory: Negative.         Objective:   Physical Exam Constitutional:      Appearance: Normal appearance. He is not ill-appearing.  HENT:     Right Ear: Tympanic membrane, ear canal and external ear normal.     Left Ear: Tympanic membrane, ear canal and external ear normal.     Nose: Nose normal.     Mouth/Throat:     Pharynx: Oropharynx is clear.   Eyes:     Conjunctiva/sclera: Conjunctivae normal.   Pulmonary:     Effort: Pulmonary effort is normal.     Breath sounds: Normal breath sounds.  Lymphadenopathy:     Cervical: No cervical adenopathy.   Neurological:     Mental Status: He is alert.           Assessment & Plan:  He has eustachian tube dysfunction, and he is given a shot of DepoMedrol today. He can also take Mucinex 1200 mg BID as needed. We will also write for Lorazepam 0.5 mg and Zofran  to take as needed for flying anxiety. Garnette Olmsted, MD

## 2024-04-19 ENCOUNTER — Other Ambulatory Visit: Payer: Self-pay | Admitting: Internal Medicine

## 2024-04-24 ENCOUNTER — Encounter: Payer: Self-pay | Admitting: Internal Medicine

## 2024-05-03 ENCOUNTER — Ambulatory Visit: Admitting: Physician Assistant

## 2024-05-15 ENCOUNTER — Other Ambulatory Visit: Payer: Self-pay | Admitting: Adult Health

## 2024-06-18 NOTE — Progress Notes (Unsigned)
 Ellouise Console, PA-C [address] [place], [place]  [address] Phone: [phone]   Gastroenterology Consultation  Referring Provider:     Johnny Garnette LABOR, MD Primary Care Physician:  Johnny Garnette LABOR, MD Primary Gastroenterologist:  Ellouise Console, PA-C / Elspeth Naval, MD  Reason for Consultation:     Dysphagia, GERD        HPI:   Edward Curtis is a 68 y.o. y/o male referred for consultation & management  by Johnny Garnette LABOR, MD.    New patient.  Here to evaluate dysphagia.  He recently saw ENT, Dr. Soldatova.  Intermittent difficulty swallowing solid foods, particularly dry food. He reports chronic sore throat worse on the left side.  Has postnasal drainage and allergic rhinitis, GERD, and Burning Mouth Syndrome.  Laryngoscopy 10/28/23:  Exam without masses or lesions, flexible laryngoscopy and bilateral nasal endoscopy performed today with evidence of GERD LPR and nasal congestion but no polyps or pus and no evidence of thrush. ENT thought it could be due to burning mouth syndrome versus chronic inflammation from GERD LPR.  Currently takes Pepcid  20 Mg twice daily.  Not on PPI.    Patient has had episodes of solid food dysphagia for many years.  Feels like food sticks in his mid chest.  Worse if he eats fast.  Worse if he eats chicken, potatoes, or bread.  It is better if he chews his food well and eats slowly.  Occasionally relieved with belching.  Occasionally has to vomit food back up.  Denies dysphagia to liquids.  He admits to nocturnal acid reflux with burning in his throat.  Has improved since he started Pepcid .  He still has breakthrough reflux at night.  12/2023 barium swallow with tablet: Small hiatal hernia.  Mild to moderate esophageal dysmotility, presbyesophagus.  Barium tablet passed the GE junction normally, however became stuck within the small hiatal hernia for 5 minutes.  12/2023 modified barium swallow: Normal.  No aspiration.  Patient has never had an EGD or  colonoscopy.  He is overdue for first screening colonoscopy.  He states his grandfather had colon cancer in his 17s.  PMH: CAD, SVT, cardiomyopathy, migraines, asthma, GERD, IBS, cholecystectomy in 2022.  Coronary stent 2020.  03/2024 echo LVEF 60 to 65%.  Past Medical History:  Diagnosis Date   Allergy    seasonal allergies    Arthritis    Asthma    Blood transfusion without reported diagnosis 10/26/1983   Calcium  oxalate renal stones    early 1990's   Cataract    Chicken pox    Chronic neck pain    Depression    GERD (gastroesophageal reflux disease)    Hyperlipidemia 10/10/2013   Hypertrophic obstructive cardiomyopathy(425.11) 10/10/2013   IBS (irritable bowel syndrome) 12/07/2011   Migraines    Neuromuscular disorder (HCC)    radiculopathy left side    Obesity (BMI 30-39.9) 10/10/2013   Seasonal allergies     Past Surgical History:  Procedure Laterality Date   CATARACT EXTRACTION Left 10/2020   CATARACT EXTRACTION Right 2025   CHOLECYSTECTOMY N/A 06/19/2021   Procedure: LAPAROSCOPIC CHOLECYSTECTOMY WITH POSSIBLE INTRAOPERATIVE CHOLANGIOGRAM;  Surgeon: Signe Mitzie LABOR, MD;  Location: [facility];  Service: General;  Laterality: N/A;   CORONARY STENT INTERVENTION N/A 03/23/2019   Procedure: CORONARY STENT INTERVENTION;  Surgeon: Anner Alm ORN, MD;  Location: [facility] INVASIVE CV LAB;  Service: Cardiovascular;  Laterality: N/A;   LEFT HEART CATH AND CORONARY ANGIOGRAPHY N/A 03/23/2019  Procedure: LEFT HEART CATH AND CORONARY ANGIOGRAPHY;  Surgeon: Anner Alm ORN, MD;  Location: [facility] INVASIVE CV LAB;  Service: Cardiovascular;  Laterality: N/A;   left knee surgery  1990   MOUTH SURGERY  1975/1993   pre cancerous mole  2013   prostate procedure  1980   umbilical cyst  1985    Prior to Admission medications   Medication Sig Start Date End Date Taking? Authorizing Provider  aspirin  EC 81 MG tablet Take 81 mg by mouth daily.    [provider]  Cyanocobalamin  (VITAMIN B  12) 500 MCG TABS Take 500 mcg by mouth daily. 03/25/20   [provider]  diphenhydrAMINE  (BENADRYL ) 25 MG tablet Take 25 mg by mouth daily as needed (allergic reactions).    [provider]  EPINEPHrine  (EPIPEN  2-PAK) 0.3 mg/0.3 mL IJ SOAJ injection Inject 0.3 mLs (0.3 mg total) into the muscle once as needed (for severe allergic reaction). CAll 911 immediately if you have to use this medicine 05/29/20   Johnny Garnette LABOR, MD  Evolocumab  (REPATHA  SURECLICK) 140 MG/ML SOAJ INJECT 140MG  INTO SKIN EVERY 14 DAYS 05/16/24   Monetta Redell PARAS, MD  ezetimibe  (ZETIA ) 10 MG tablet Take 1 tablet (10 mg total) by mouth daily. 03/26/24   Hilty, Vinie BROCKS, MD  famotidine  (PEPCID ) 20 MG tablet Take 1 tablet (20 mg total) by mouth 2 (two) times daily. 02/24/24   Soldatova, Liuba, MD  ketoconazole (NIZORAL) 2 % cream Apply topically daily. 02/18/21   [provider]  loratadine  (CLARITIN ) 10 MG tablet Take 1 tablet (10 mg total) by mouth daily. 02/24/24   Soldatova, Liuba, MD  LORazepam  (ATIVAN ) 0.5 MG tablet Take 1 tablet (0.5 mg total) by mouth every 8 (eight) hours as needed for anxiety. 04/17/24   Johnny Garnette LABOR, MD  Melatonin 1 MG CHEW Chew 1 mg by mouth daily. 10/26/19   [provider]  nitroGLYCERIN  (NITROSTAT ) 0.4 MG SL tablet Place 1 tablet (0.4 mg total) under the tongue every 5 (five) minutes as needed for chest pain. 04/13/24   Hilty, Vinie BROCKS, MD  ondansetron  (ZOFRAN ) 8 MG tablet Take 1 tablet (8 mg total) by mouth every 8 (eight) hours as needed for nausea or vomiting. 04/17/24   Johnny Garnette LABOR, MD  Polyethylene Glycol 3350  (MIRALAX  PO) Take by mouth.    [provider]  tadalafil  (CIALIS ) 5 MG tablet Take 1 tablet (5 mg total) by mouth every other day. PRN 04/13/24   Mona Vinie BROCKS, MD  valACYclovir (VALTREX) 1000 MG tablet Take 1,000 mg by mouth daily as needed. 05/23/21   [provider]  verapamil  (CALAN ) 80 MG tablet TAKE ONE TABLET DAILY AS NEEDED 04/23/24    Hilty, Vinie BROCKS, MD  verapamil  (CALAN -SR) 180 MG CR tablet TAKE ONE TABLET DAILY 04/23/24   Hilty, Vinie BROCKS, MD    Family History  Problem Relation Age of Onset   Arthritis Mother    Hyperlipidemia Mother    Heart disease Mother    Hypertension Mother    Heart disease Father    Hypertension Father    Diabetes Father    Arthritis Paternal Grandmother    Diabetes Paternal Grandmother    Colon cancer Paternal Grandfather    Heart disease Paternal Grandfather    Diabetes Paternal Grandfather    Hyperlipidemia Maternal Grandmother    Hypertension Other        both sets of parents   Esophageal cancer Neg Hx    Rectal  cancer Neg Hx    Stomach cancer Neg Hx      Social History   Tobacco Use   Smoking status: Never   Smokeless tobacco: Never  Vaping Use   Vaping status: Never Used  Substance Use Topics   Alcohol use: Yes    Alcohol/week: 1.0 standard drink of alcohol    Types: 1 Glasses of wine per week    Comment: once a month   Drug use: No    Allergies as of 06/19/2024 - Review Complete 06/19/2024  Allergen Reaction Noted   Ciprofloxacin  Other (See Comments) 08/28/2018   Iodinated contrast media Anaphylaxis, Hives, Itching, and Swelling 10/11/2013   Penicillins Hives 03/02/2011   Yellow jacket venom Anaphylaxis 05/12/2021   Atorvastatin   07/06/2019   Clindamycin/lincomycin Diarrhea and Nausea Only 03/20/2019   Erythromycin Diarrhea and Nausea Only 02/10/2012   Ibuprofen Other (See Comments) 03/02/2011   Keflex [cephalexin]  10/05/2019   Other Itching and Other (See Comments) 10/21/2014   Rosuvastatin   07/06/2019   Sulfa antibiotics Hives 03/02/2011   Latex Rash 01/04/2019    Review of Systems:    All systems reviewed and negative except where noted in HPI.   Physical Exam:  BP 116/74   Pulse 84   Ht 5' 8 (1.727 m)   Wt 229 lb 8 oz (104.1 kg)   BMI 34.90 kg/m  No LMP for male patient.  General:   Alert,  Well-developed, well-nourished, pleasant and  cooperative in NAD Lungs:  Respirations even and unlabored.  Clear throughout to auscultation.   No wheezes, crackles, or rhonchi. No acute distress. Heart:  Regular rate and rhythm; no murmurs, clicks, rubs, or gallops. Abdomen:  Normal bowel sounds.  No bruits.  Soft, and non-distended without masses, hepatosplenomegaly or hernias noted.  No Tenderness.  No guarding or rebound tenderness.    Neurologic:  Alert and oriented x3;  grossly normal neurologically. Psych:  Alert and cooperative. Normal mood and affect.  Imaging Studies: No results found.  Labs: CBC    Component Value Date/Time   WBC 11.2 (H) 09/30/2023 1417   RBC 5.11 09/30/2023 1417   HGB 17.1 (H) 09/30/2023 1417   HGB 15.8 03/16/2019 1505   HCT 49.0 09/30/2023 1417   HCT 45.7 03/16/2019 1505   PLT 209.0 09/30/2023 1417   PLT 225 03/16/2019 1505   MCV 95.8 09/30/2023 1417   MCV 91 03/16/2019 1505    CMP     Component Value Date/Time   NA 139 10/04/2023 0809   K 4.9 10/04/2023 0809   CL 104 10/04/2023 0809   CO2 24 10/04/2023 0809   GLUCOSE 93 10/04/2023 0809   GLUCOSE 87 08/24/2023 1603   BUN 8 10/04/2023 0809   CREATININE 0.94 10/04/2023 0809   CREATININE 0.83 10/01/2013 1407   CALCIUM  9.3 10/04/2023 0809   PROT 6.5 10/04/2023 0809   ALBUMIN  4.3 10/04/2023 0809   AST 21 10/04/2023 0809   ALT 16 10/04/2023 0809   ALKPHOS 70 10/04/2023 0809   BILITOT 0.4 10/04/2023 0809   GFRNONAA >60 06/20/2021 0527   GFRAA 88 05/02/2020 0810    Assessment and Plan:   Edward Curtis is a 68 y.o. y/o male has been referred for:  1.  Dysphagia to solid foods.  Suspicious for lower esophageal stricture. - Scheduling EGD I discussed risks of EGD with patient to include risk of bleeding, perforation, and risk of sedation.  Patient expressed understanding and agrees to proceed with EGD.   2.  Hiatal hernia - Patient education discussed.  3.  Esophageal dysmotility -  Recommend eat small frequent meals.  Eat slowly.   Eat sitting up.    4.  GERD/LPRD - Rx Pantoprazole  20mg  1 tablet once daily. -Continue Pepcid  20 Mg twice daily. - Recommend Lifestyle Modifications to prevent Acid Reflux.  Rec. Avoid coffee, sodas, peppermint, garlic, onions, alcohol, citrus fruits, chocolate, tomatoes, fatty and spicey foods.  Avoid eating 2-3 hours before bedtime.    5.  Colon cancer screening: No previous colonoscopy.  His grandfather had colon cancer age 68. - Scheduling First Screening Colonoscopy I discussed risks of colonoscopy with patient to include risk of bleeding, colon perforation, and risk of sedation.  Patient expressed understanding and agrees to proceed with colonoscopy.   Follow up 4 weeks after procedures with TG.  Ellouise Console, PA-C

## 2024-06-19 ENCOUNTER — Ambulatory Visit: Admitting: Physician Assistant

## 2024-06-19 ENCOUNTER — Encounter: Payer: Self-pay | Admitting: Physician Assistant

## 2024-06-19 DIAGNOSIS — Z1211 Encounter for screening for malignant neoplasm of colon: Secondary | ICD-10-CM

## 2024-06-19 DIAGNOSIS — R1310 Dysphagia, unspecified: Secondary | ICD-10-CM | POA: Diagnosis not present

## 2024-06-19 DIAGNOSIS — K219 Gastro-esophageal reflux disease without esophagitis: Secondary | ICD-10-CM

## 2024-06-19 MED ORDER — PANTOPRAZOLE SODIUM 20 MG PO TBEC: 20.0000 mg | DELAYED_RELEASE_TABLET | Freq: Every day | ORAL | 3 refills | Status: AC

## 2024-06-19 MED ORDER — NA SULFATE-K SULFATE-MG SULF 17.5-3.13-1.6 GM/177ML PO SOLN: 1.0000 | Freq: Once | ORAL | 0 refills | Status: AC

## 2024-06-19 NOTE — Progress Notes (Signed)
 Agree with assessment and plan as outlined.

## 2024-06-19 NOTE — Patient Instructions (Addendum)
 We have sent the following medications to your pharmacy for you to pick up at your convenience: Pantoprazole  20 mg once daily  You have been scheduled for an Endoscopy and Colonoscopy. Please follow the written instructions given to you at your visit today.  If you use inhalers (even only as needed), please bring them with you on the day of your procedure.  DO NOT TAKE 7 DAYS PRIOR TO TEST- Trulicity (dulaglutide) Ozempic, Wegovy (semaglutide) Mounjaro (tirzepatide) Bydureon Bcise (exanatide extended release)  DO NOT TAKE 1 DAY PRIOR TO YOUR TEST Rybelsus (semaglutide) Adlyxin (lixisenatide) Victoza (liraglutide) Byetta (exanatide) ___________________________________________________________________________  Please follow up sooner if symptoms increase or worsen __________________________________________________________________________  Due to recent changes in healthcare laws, you may see the results of your imaging and laboratory studies on MyChart before your provider has had a chance to review them.  We understand that in some cases there may be results that are confusing or concerning to you. Not all laboratory results come back in the same time frame and the provider may be waiting for multiple results in order to interpret others.  Please give us  48 hours in order for your provider to thoroughly review all the results before contacting the office for clarification of your results.   Thank you for trusting me with your gastrointestinal care!   Ellouise Console, PA-C _______________________________________________________  If your blood pressure at your visit was 140/90 or greater, please contact your primary care physician to follow up on this.  _______________________________________________________  If you are age 15 or older, your body mass index should be between 23-30. Your Body mass index is 34.9 kg/m. If this is out of the aforementioned range listed, please consider follow  up with your Primary Care Provider.  If you are age 95 or younger, your body mass index should be between 19-25. Your Body mass index is 34.9 kg/m. If this is out of the aformentioned range listed, please consider follow up with your Primary Care Provider.   ________________________________________________________  The [facility] providers would like to encourage you to use MYCHART to communicate with providers for non-urgent requests or questions.  Due to long hold times on the telephone, sending your provider a message by [facility] may be a faster and more efficient way to get a response.  Please allow 48 business hours for a response.  Please remember that this is for non-urgent requests.  _______________________________________________________

## 2024-07-17 ENCOUNTER — Ambulatory Visit (INDEPENDENT_AMBULATORY_CARE_PROVIDER_SITE_OTHER): Admitting: Family Medicine

## 2024-07-17 ENCOUNTER — Encounter: Payer: Self-pay | Admitting: Family Medicine

## 2024-07-17 ENCOUNTER — Ambulatory Visit: Payer: Self-pay

## 2024-07-17 VITALS — BP 122/74 | HR 88 | Temp 98.1°F | Ht 68.0 in | Wt 223.0 lb

## 2024-07-17 DIAGNOSIS — S40862S Insect bite (nonvenomous) of left upper arm, sequela: Secondary | ICD-10-CM

## 2024-07-17 DIAGNOSIS — W57XXXS Bitten or stung by nonvenomous insect and other nonvenomous arthropods, sequela: Secondary | ICD-10-CM | POA: Diagnosis not present

## 2024-07-17 NOTE — Telephone Encounter (Signed)
 FYI Only or Action Required?: FYI only for provider.  Patient was last seen in primary care on 04/17/2024 by Johnny Garnette LABOR, MD.  Called Nurse Triage reporting Insect Bite.  Symptoms began 2 days ago.  Symptoms are: gradually improving.  Triage Disposition: See Physician Within 24 Hours  Patient/caregiver understands and will follow disposition?: Yes      Copied from CRM #[id number]. Topic: Clinical - Red Word Triage >> Jul 17, 2024 12:56 PM Drema MATSU wrote: Red Word that prompted transfer to Nurse Triage: A spider (black house) bit patient on Monday. The place where he was bit was very painful but he had a lot of red streaks in that area. He states he is late on his Tetatus shot as well.      Reason for Disposition  No prior tetanus shots (or is not fully vaccinated)  Answer Assessment - Initial Assessment Questions 1. TYPE of SPIDER: What type of spider was it?  (e.g., name, unknown, or brief description)     Unsure, black house spider  2. LOCATION: Where is the bite located?      Left arm near shoulder  3. PAIN: Is there any pain? If Yes, ask: How bad is it?  (Scale 1-10; or mild, moderate, severe)     Not painful today  4. SWELLING: How big is the swelling? (Inches, cm or compare to coins)      Mild swelling  5. ONSET: When did the bite occur? (e.g., minutes, hours ago)      2 days ago  6. TETANUS: When was your last tetanus booster?      2015 7. OTHER SYMPTOMS: Do you have any other symptoms?  (e.g., muscle cramps, abdomen pain, change in urine color)     Red swollen line from the spider bite  Protocols used: Spider Bite - [employer]

## 2024-07-17 NOTE — Progress Notes (Signed)
   Acute Office Visit   Subjective:  Patient ID: Edward Curtis, male    DOB: 03/11/1956, 68 y.o.   MRN: [id number]  Chief Complaint  Patient presents with   Insect Bite    HPI Patient reports on Sunday night he was bit by a spider on his left upper arm, anterior shoulder. He reports the area was initial red with streaks, but has improved since then. He reports he has been applying Benadryl  spray and Neosporin on the area.  ROS See HPI above      Objective:   BP 122/74   Pulse 88   Temp 98.1 F (36.7 C) (Oral)   Ht 5' 8 (1.727 m)   Wt 223 lb (101.2 kg)   SpO2 97%   BMI 33.91 kg/m    Physical Exam Vitals reviewed.  Constitutional:      General: He is not in acute distress.    Appearance: Normal appearance. He is obese. He is not ill-appearing, toxic-appearing or diaphoretic.  HENT:     Head: Normocephalic and atraumatic.  Eyes:     General:        Right eye: No discharge.        Left eye: No discharge.     Conjunctiva/sclera: Conjunctivae normal.  Cardiovascular:     Rate and Rhythm: Normal rate.  Pulmonary:     Effort: Pulmonary effort is normal. No respiratory distress.  Musculoskeletal:        General: Normal range of motion.  Skin:    General: Skin is warm and dry.     Comments: See picture of left arm/shoulder for bite area.   Neurological:     General: No focal deficit present.     Mental Status: He is alert and oriented to person, place, and time. Mental status is at baseline.     Motor: No weakness.     Gait: Gait normal.  Psychiatric:        Mood and Affect: Mood normal.        Behavior: Behavior normal.        Thought Content: Thought content normal.        Judgment: Judgment normal.           Assessment & Plan:  Insect bite of left upper arm, sequela  -May obtain Tdap (tetanus) vaccine at your local pharmacy.  -May use cool compresses 4-6 times a day, up to 20 minutes at a time to help with localized redness, itching, and pain.  -May  continue to use Benadryl  spray and Neosporin cream.  -Recommend to cleanse area with a mild soap, pat dry, and apply over the counter products.  -Send a MyChart message if symptoms before worse, such as increased pain, increased side of redness, or develops any drainage.   Remmi Armenteros, NP

## 2024-07-17 NOTE — Patient Instructions (Signed)
-  It was nice to care for you today. -May obtain Tdap (tetanus) vaccine at your local pharmacy.  -May use cool compresses 4-6 times a day, up to 20 minutes at a time to help with localized redness, itching, and pain.  -May continue to use Benadryl  spray and Neosporin cream.  -Recommend to cleanse area with a mild soap, pat dry, and apply over the counter products.  -Send a MyChart message if symptoms before worse, such as increased pain, increased side of redness, or develops any drainage.

## 2024-07-17 NOTE — Telephone Encounter (Signed)
 Pt was seen this afternoon by Philippe Slade

## 2024-08-06 ENCOUNTER — Telehealth: Payer: Self-pay | Admitting: Pharmacy Technician

## 2024-08-06 ENCOUNTER — Other Ambulatory Visit (HOSPITAL_COMMUNITY): Payer: Self-pay

## 2024-08-06 ENCOUNTER — Encounter: Payer: Self-pay | Admitting: Internal Medicine

## 2024-08-06 NOTE — Telephone Encounter (Signed)
 Patient Advocate Encounter   The patient was approved for a [employer] grant that will help cover the cost of repatha  Total amount awarded, 2500.  Effective: 07/07/24 - 07/06/25   APW:[id number] ERW:[id number] Hmnle:[id number] PI:[id number] [employer] ID: [id number]   Pharmacy provided with approval and processing information. Patient informed via mychart

## 2024-08-21 ENCOUNTER — Ambulatory Visit (HOSPITAL_BASED_OUTPATIENT_CLINIC_OR_DEPARTMENT_OTHER)
Admission: RE | Admit: 2024-08-21 | Discharge: 2024-08-21 | Disposition: A | Discharge status: Home / Self Care | Source: Ambulatory Visit | Attending: Family Medicine | Admitting: Family Medicine

## 2024-08-21 ENCOUNTER — Ambulatory Visit (INDEPENDENT_AMBULATORY_CARE_PROVIDER_SITE_OTHER): Admitting: Family Medicine

## 2024-08-21 ENCOUNTER — Encounter: Payer: Self-pay | Admitting: Family Medicine

## 2024-08-21 VITALS — BP 116/64 | HR 89 | Temp 98.1°F | Wt 233.7 lb

## 2024-08-21 DIAGNOSIS — R1031 Right lower quadrant pain: Secondary | ICD-10-CM | POA: Insufficient documentation

## 2024-08-21 LAB — POC URINALSYSI DIPSTICK (AUTOMATED)
Bilirubin, UA: NEGATIVE
Blood, UA: NEGATIVE
Glucose, UA: NEGATIVE
Ketones, UA: NEGATIVE
Leukocytes, UA: NEGATIVE
Nitrite, UA: NEGATIVE
Protein, UA: NEGATIVE
Spec Grav, UA: 1.015 (ref 1.010–1.025)
Urobilinogen, UA: 0.2 U/dL
pH, UA: 6 (ref 5.0–8.0)

## 2024-08-21 NOTE — Progress Notes (Signed)
 Established Patient Office Visit  Subjective   Patient ID: Edward Curtis, male    DOB: December 11, 1955  Age: 68 y.o. MRN: [id number]  Chief Complaint  Patient presents with   Abdominal Pain   Back Pain    HPI   Mr. Benzel is seen as a work in with onset last night around 5 to 6 PM of right lower quadrant abdominal pain with some radiation toward the back.  He states his pain was 9 out of 10 intensity last night and felt somewhat better this morning but has worsened somewhat through the day today.  Currently 7 out of 10 intensity.  Denies any associated nausea or vomiting.  No stool changes.  Appetite is fair.  No fevers or chills.  Reportedly has history of kidney stones.  No recent gross hematuria.  He had CT scan 2020 with mention of diverticula descending and sigmoid colon.  He has never had colonoscopy.  Still has his appendix.  Has had previous cholecystectomy.  Denies any recent injury.   Past Medical History:  Diagnosis Date   Allergy    seasonal allergies    Arthritis    Asthma    Blood transfusion without reported diagnosis 10/26/1983   Calcium  oxalate renal stones    early 1990's   Cataract    Chicken pox    Chronic neck pain    Depression    GERD (gastroesophageal reflux disease)    Hyperlipidemia 10/10/2013   Hypertrophic obstructive cardiomyopathy(425.11) 10/10/2013   IBS (irritable bowel syndrome) 12/07/2011   Migraines    Neuromuscular disorder (HCC)    radiculopathy left side    Obesity (BMI 30-39.9) 10/10/2013   Seasonal allergies    Past Surgical History:  Procedure Laterality Date   CATARACT EXTRACTION Left 10/2020   CATARACT EXTRACTION Right 2025   CHOLECYSTECTOMY N/A 06/19/2021   Procedure: LAPAROSCOPIC CHOLECYSTECTOMY WITH POSSIBLE INTRAOPERATIVE CHOLANGIOGRAM;  Surgeon: Signe Mitzie LABOR, MD;  Location: [facility] ORS;  Service: General;  Laterality: N/A;   CORONARY STENT INTERVENTION N/A 03/23/2019   Procedure: CORONARY STENT INTERVENTION;  Surgeon: Anner Alm ORN, MD;  Location: [facility] INVASIVE CV LAB;  Service: Cardiovascular;  Laterality: N/A;   LEFT HEART CATH AND CORONARY ANGIOGRAPHY N/A 03/23/2019   Procedure: LEFT HEART CATH AND CORONARY ANGIOGRAPHY;  Surgeon: Anner Alm ORN, MD;  Location: [facility] INVASIVE CV LAB;  Service: Cardiovascular;  Laterality: N/A;   left knee surgery  1990   MOUTH SURGERY  1975/1993   pre cancerous mole  2013   prostate procedure  1980   umbilical cyst  1985    reports that he has never smoked. He has never used smokeless tobacco. He reports current alcohol use of about 1.0 standard drink of alcohol per week. He reports that he does not use drugs. family history includes Arthritis in his mother and paternal grandmother; Colon cancer in his paternal grandfather; Diabetes in his father, paternal grandfather, and paternal grandmother; Heart disease in his father, mother, and paternal grandfather; Hyperlipidemia in his maternal grandmother and mother; Hypertension in his father, mother, and another family member. Allergies  Allergen Reactions   Ciprofloxacin  Other (See Comments)    Possible leg swelling, pain, unable to walk   Iodinated Contrast Media Anaphylaxis, Hives, Itching and Swelling    Patient must be premedicated with 13 hour protocol prior to IV contrast administration   Penicillins Hives    Did it involve swelling of the face/tongue/throat, SOB, or low BP? No Did it involve sudden or severe  rash/hives, skin peeling, or any reaction on the inside of your mouth or nose? Yes Did you need to seek medical attention at a hospital or doctor's office? Yes When did it last happen?      25 + years ago If all above answers are "NO", may proceed with cephalosporin use.    Yellow Jacket Venom Anaphylaxis    Swelling    Atorvastatin      myaglias   Clindamycin/Lincomycin Diarrhea and Nausea Only   Erythromycin Diarrhea and Nausea Only   Ibuprofen Other (See Comments)    Causes tachycardia   Keflex [Cephalexin]      Rash hives   Other Itching and Other (See Comments)     Walnut trees and walnuts , sores in mouth   Rosuvastatin      myaligias   Sulfa Antibiotics Hives   Latex Rash    Developed rash 06/2017 that progressed.  Allergy testing positive for latex allergy.  Later determined rash was 2/2 latex in [employer] foods.    Review of Systems  Constitutional:  Negative for chills, fever and weight loss.  Cardiovascular:  Negative for chest pain.  Gastrointestinal:  Positive for abdominal pain. Negative for blood in stool, constipation, diarrhea, melena, nausea and vomiting.  Genitourinary:  Negative for hematuria.      Objective:     BP 116/64   Pulse 89   Temp 98.1 F (36.7 C) (Oral)   Wt 233 lb 11.2 oz (106 kg)   SpO2 95%   BMI 35.53 kg/m  BP Readings from Last 3 Encounters:  08/21/24 116/64  07/17/24 122/74  06/19/24 116/74   Wt Readings from Last 3 Encounters:  08/21/24 233 lb 11.2 oz (106 kg)  07/17/24 223 lb (101.2 kg)  06/19/24 229 lb 8 oz (104.1 kg)      Physical Exam Vitals reviewed.  Constitutional:      General: He is not in acute distress.    Appearance: He is not ill-appearing.  Cardiovascular:     Rate and Rhythm: Normal rate and regular rhythm.  Pulmonary:     Effort: Pulmonary effort is normal.     Breath sounds: Normal breath sounds. No wheezing or rales.  Abdominal:     Comments: Abdomen is nondistended.  Normal bowel sounds.  Soft with tenderness right lower quadrant.  He has some mild guarding in this region.  No rebound tenderness.  No hepatomegaly or splenomegaly noted.  Neurological:     Mental Status: He is alert.      No results found for any visits on 08/21/24.    The ASCVD Risk score (Arnett DK, et al., 2019) failed to calculate for the following reasons:   The valid total cholesterol range is 130 to 320 mg/dL    Assessment & Plan:   68 year old male who presents with onset last night rather acutely of right lower quadrant abdominal  pain with some radiation toward the back.  No reported fever.  Past history of stones.  Differential is kidney stone versus appendicitis versus other.  He has never had colonoscopy but had diverticula on previous CT scan but this was descending and sigmoid colon.  Doubt acute diverticulitis.  Nontoxic in appearance.  -Check urinalysis-dipstick is normal with no hematuria or leukocytes - Recommend CT abdomen pelvis to further assess given severity of pain, localized tenderness over right lower quadrant, and progressive pain today We are ordering CT stat and patient knows to go to ER immediately for any worsening pain or other concerns  No follow-ups on file.    Wolm Scarlet, MD

## 2024-08-22 ENCOUNTER — Ambulatory Visit: Payer: Self-pay | Admitting: Family Medicine

## 2024-08-23 ENCOUNTER — Encounter (INDEPENDENT_AMBULATORY_CARE_PROVIDER_SITE_OTHER): Admitting: Ophthalmology

## 2024-09-17 ENCOUNTER — Telehealth: Payer: Self-pay | Admitting: Internal Medicine

## 2024-09-17 NOTE — Telephone Encounter (Signed)
 Pt c/o swelling/edema: STAT if pt has developed SOB within 24 hours  If swelling, where is the swelling located? Both feet and ankles, right side is worse than left side  How much weight have you gained and in what time span? 3lbs in a 1 1/2 weeks  Have you gained 2 pounds in a day or 5 pounds in a week? no  Do you have a log of your daily weights (if so, list)? no  Are you currently taking a fluid pill? no  Are you currently SOB? no  Have you traveled recently in a car or plane for an extended period of time? No  Patient stated he is having some weird pain in his feet, like a tingling feeling, with sharpe little pains.

## 2024-09-17 NOTE — Telephone Encounter (Signed)
 Spoke with pt who is reporting both feet are swollen up to his ankles.  Right worse than left.  Has been occurring off/on for about 2 weeks.  Seems to get some better overnight but then returns.  Admits to eating out a lot and also eating processed foods.  States the past 2 days he has been eating at home and is now only drinking water.  Not adding salt at meals.  Today wt is down from 228 to 225 lbs but feet still somewhat swollen with tingling/burning pain (which is improved from several days ago).  Advised to keep feet elevated above the level of his heart during the day as much as possible and wear knee high compression stockings daily. He reports BP on average has been 116/82, 02 sat 95-99% and HR 85-105.  He also wants Dr Mona to be aware he has gained a lot of wt over the past year.  Probably 35 to 40 lbs.  Advised I will forward this information to MD/nurse for review and any new orders.

## 2024-09-18 ENCOUNTER — Encounter: Payer: Self-pay | Admitting: Internal Medicine

## 2024-09-18 MED ORDER — FUROSEMIDE 20 MG PO TABS: 20.0000 mg | ORAL_TABLET | Freq: Every day | ORAL | 2 refills | Status: AC | PRN

## 2024-09-18 NOTE — Telephone Encounter (Signed)
 Patient also sent MyChart message, shared provider's response with patient via MyChart. See Patient Message on 09/18/24 under Encounters for details.

## 2024-09-23 ENCOUNTER — Encounter: Payer: Self-pay | Admitting: Cardiology

## 2024-09-24 NOTE — Telephone Encounter (Signed)
 Noted

## 2024-09-25 ENCOUNTER — Encounter: Payer: Self-pay | Admitting: Family Medicine

## 2024-09-25 ENCOUNTER — Ambulatory Visit: Payer: Self-pay | Admitting: Family Medicine

## 2024-09-25 ENCOUNTER — Ambulatory Visit: Admitting: Family Medicine

## 2024-09-25 VITALS — BP 130/80 | HR 90 | Temp 98.6°F | Wt 229.0 lb

## 2024-09-25 DIAGNOSIS — I422 Other hypertrophic cardiomyopathy: Secondary | ICD-10-CM | POA: Diagnosis not present

## 2024-09-25 DIAGNOSIS — M25471 Effusion, right ankle: Secondary | ICD-10-CM | POA: Diagnosis not present

## 2024-09-25 DIAGNOSIS — M25472 Effusion, left ankle: Secondary | ICD-10-CM

## 2024-09-25 DIAGNOSIS — Z23 Encounter for immunization: Secondary | ICD-10-CM | POA: Diagnosis not present

## 2024-09-25 DIAGNOSIS — R7303 Prediabetes: Secondary | ICD-10-CM | POA: Diagnosis not present

## 2024-09-25 DIAGNOSIS — E538 Deficiency of other specified B group vitamins: Secondary | ICD-10-CM | POA: Diagnosis not present

## 2024-09-25 DIAGNOSIS — E785 Hyperlipidemia, unspecified: Secondary | ICD-10-CM

## 2024-09-25 LAB — CBC WITH DIFFERENTIAL/PLATELET
Basophils Absolute: 0 K/uL (ref 0.0–0.1)
Basophils Relative: 0.7 % (ref 0.0–3.0)
Eosinophils Absolute: 0.1 K/uL (ref 0.0–0.7)
Eosinophils Relative: 1.4 % (ref 0.0–5.0)
HCT: 45 % (ref 39.0–52.0)
Hemoglobin: 15.2 g/dL (ref 13.0–17.0)
Lymphocytes Relative: 23.6 % (ref 12.0–46.0)
Lymphs Abs: 1.6 K/uL (ref 0.7–4.0)
MCHC: 33.9 g/dL (ref 30.0–36.0)
MCV: 95.7 fl (ref 78.0–100.0)
Monocytes Absolute: 0.6 K/uL (ref 0.1–1.0)
Monocytes Relative: 8.7 % (ref 3.0–12.0)
Neutro Abs: 4.4 K/uL (ref 1.4–7.7)
Neutrophils Relative %: 65.6 % (ref 43.0–77.0)
Platelets: 199 K/uL (ref 150.0–400.0)
RBC: 4.71 Mil/uL (ref 4.22–5.81)
RDW: 13 % (ref 11.5–15.5)
WBC: 6.7 K/uL (ref 4.0–10.5)

## 2024-09-25 LAB — BASIC METABOLIC PANEL WITH GFR
BUN: 15 mg/dL (ref 6–23)
CO2: 27 meq/L (ref 19–32)
Calcium: 9.5 mg/dL (ref 8.4–10.5)
Chloride: 107 meq/L (ref 96–112)
Creatinine, Ser: 1.25 mg/dL (ref 0.40–1.50)
GFR: 58.69 mL/min — ABNORMAL LOW (ref >60.00)
Glucose, Bld: 107 mg/dL — ABNORMAL HIGH (ref 70–99)
Potassium: 3.8 meq/L (ref 3.5–5.1)
Sodium: 142 meq/L (ref 135–145)

## 2024-09-25 LAB — HEPATIC FUNCTION PANEL
ALT: 22 U/L (ref 0–53)
AST: 20 U/L (ref 0–37)
Albumin: 4.6 g/dL (ref 3.5–5.2)
Alkaline Phosphatase: 60 U/L (ref 39–117)
Bilirubin, Direct: 0.1 mg/dL (ref 0.0–0.3)
Total Bilirubin: 0.5 mg/dL (ref 0.2–1.2)
Total Protein: 6.9 g/dL (ref 6.0–8.3)

## 2024-09-25 LAB — VITAMIN B12: Vitamin B-12: 376 pg/mL (ref 211–911)

## 2024-09-25 LAB — TSH: TSH: 4.11 u[IU]/mL (ref 0.35–5.50)

## 2024-09-25 LAB — BRAIN NATRIURETIC PEPTIDE: Pro B Natriuretic peptide (BNP): 15 pg/mL (ref 0.0–100.0)

## 2024-09-25 LAB — HEMOGLOBIN A1C: Hgb A1c MFr Bld: 5.4 % (ref 4.6–6.5)

## 2024-09-25 NOTE — Addendum Note (Signed)
 Addended by: LADONNA INOCENTE SAILOR on: 09/25/2024 02:09 PM   Modules accepted: Orders

## 2024-09-25 NOTE — Progress Notes (Signed)
   Subjective:    Patient ID: Edward Curtis, male    DOB: [birth year], 68 y.o.   MRN: [id number]  HPI Here to discuss swelling in the feet and ankles. He has never had this before, but it started one week ago. No SOB. He admits to eating a lot of sodium rich foods. His ECHO in June showed an EF of 60-65% and Grade I diastolic dysfunction. He has not had labs checked in the past year. Dr. Mona started him on Lasix  20 mg daily, and this has been quite successful. He has lost about 10 lbs, and the swelling has almost disappeared.    Review of Systems  Constitutional: Negative.   Respiratory: Negative.    Cardiovascular:  Positive for leg swelling. Negative for chest pain and palpitations.       Objective:   Physical Exam Constitutional:      Appearance: Normal appearance.  Cardiovascular:     Rate and Rhythm: Normal rate and regular rhythm.     Pulses: Normal pulses.     Heart sounds: Normal heart sounds.  Pulmonary:     Effort: Pulmonary effort is normal.     Breath sounds: Normal breath sounds.  Musculoskeletal:     Right lower leg: No edema.     Left lower leg: No edema.  Neurological:     Mental Status: He is alert.           Assessment & Plan:  Ankle edema. This is controlled well with low dose Lasix . We will check labs today to check kidney function, BNP, etc.  Garnette Olmsted, MD

## 2024-09-26 ENCOUNTER — Other Ambulatory Visit: Payer: Self-pay | Admitting: Internal Medicine

## 2024-09-27 ENCOUNTER — Encounter (INDEPENDENT_AMBULATORY_CARE_PROVIDER_SITE_OTHER): Admitting: Ophthalmology

## 2024-09-27 ENCOUNTER — Encounter: Payer: Self-pay | Admitting: Family Medicine

## 2024-09-27 DIAGNOSIS — H353131 Nonexudative age-related macular degeneration, bilateral, early dry stage: Secondary | ICD-10-CM

## 2024-09-27 DIAGNOSIS — H35372 Puckering of macula, left eye: Secondary | ICD-10-CM | POA: Diagnosis not present

## 2024-09-27 DIAGNOSIS — H43813 Vitreous degeneration, bilateral: Secondary | ICD-10-CM

## 2024-09-27 DIAGNOSIS — H35342 Macular cyst, hole, or pseudohole, left eye: Secondary | ICD-10-CM | POA: Diagnosis not present

## 2024-09-28 NOTE — Telephone Encounter (Signed)
 I see Dr. Mona answered his note already. He thinks the swelling may be medication related, and I agree. The cardiology office was trying to get Edward Curtis an appt with one of Dr. Katharina partners next week.

## 2024-09-28 NOTE — Telephone Encounter (Signed)
 First of all, I am quite confident that he does NOT have heart failure because the BNP level we just checked is well within normal range. Normal levels are below 100, and his was only 15. He can keep taking the Lasix  for now. I advised him to wait to see Dr. Mona. There is no way that he could get to see someone else any sooner.

## 2024-10-01 ENCOUNTER — Ambulatory Visit: Admitting: Cardiology

## 2024-10-01 ENCOUNTER — Ambulatory Visit: Attending: Cardiology | Admitting: Cardiology

## 2024-10-01 ENCOUNTER — Encounter: Payer: Self-pay | Admitting: Cardiology

## 2024-10-01 VITALS — BP 118/62 | HR 98 | Ht 68.0 in | Wt 233.2 lb

## 2024-10-01 DIAGNOSIS — I25119 Atherosclerotic heart disease of native coronary artery with unspecified angina pectoris: Secondary | ICD-10-CM

## 2024-10-01 DIAGNOSIS — R600 Localized edema: Secondary | ICD-10-CM | POA: Diagnosis not present

## 2024-10-01 DIAGNOSIS — I10 Essential (primary) hypertension: Secondary | ICD-10-CM

## 2024-10-01 NOTE — Patient Instructions (Addendum)
 Medication Instructions:  You may take your Furosemide  as needed. Continue all other medications as listed.  *If you need a refill on your cardiac medications before your next appointment, please call your pharmacy*  Follow-Up: At [facility], you and your health needs are our priority.  As part of our continuing mission to provide you with exceptional heart care, our providers are all part of one team.  This team includes your primary Cardiologist (physician) and Advanced Practice Providers or APPs (Physician Assistants and Nurse Practitioners) who all work together to provide you with the care you need, when you need it.  Your next appointment:   Follow up as previously instructed with Dr Mona.  We recommend signing up for the patient portal called MyChart.  Sign up information is provided on this After Visit Summary.  MyChart is used to connect with patients for Virtual Visits (Telemedicine).  Patients are able to view lab/test results, encounter notes, upcoming appointments, etc.  Non-urgent messages can be sent to your provider as well.   To learn more about what you can do with MyChart, go to [email or web address].

## 2024-10-01 NOTE — Progress Notes (Signed)
 Cardiology Office Note:  .   Date:  10/01/2024  ID:  Charlie Brooks, DOB 1955/11/22, MRN [id number] PCP: Johnny Garnette LABOR, MD  [facility] Providers Cardiologist:  Redell Leiter, MD    History of Present Illness: .   Edward Curtis is a 68 y.o. male Discussed the use of AI scribe   History of Present Illness Jevan Gaunt is a 68 year old male with coronary artery disease who presents with bilateral lower extremity edema.  He has experienced swelling in his feet and lower legs, more pronounced on the right side, for the past three weeks. Initially, the swelling was intermittent over the past six months but became more persistent following an episode of suspected food poisoning three weeks ago. The swelling worsened over a couple of days after the episode.  He was prescribed low-dose Lasix  (20 mg) for four days, after which the swelling went away. He experimented by discontinuing Lasix  and noted that taking Cialis  (5 mg every other day) seemed to correlate with increased swelling, although he is unsure if it is the cause. He has not taken Lasix  since Thursday and reports improvement in swelling.  He has been on verapamil , a calcium  channel blocker, for over ten years. His sister, who is in healthcare, suggested a possible interaction between Cialis  and verapamil , which might contribute to the swelling. He also notes that verapamil  can cause swelling, although he has been on it for a long time without previous issues.  He mentions a family history of heart problems, including his father having heart attacks, his mother having heart trouble, and his grandfather dying of heart failure. He also reports occasional heart fluttering, which is not regular.  He discusses his blood pressure, noting it is sometimes high, particularly in the morning. He suspects he might have sleep apnea due to snoring and a dream where he was told to 'breathe' and woke up not breathing. He has been monitoring his  blood pressure since November 30th, noting fluctuations throughout the day.      Studies Reviewed: SABRA   EKG Interpretation Date/Time:  Monday October 01 2024 13:46:59 EST Ventricular Rate:  98 PR Interval:  132 QRS Duration:  114 QT Interval:  338 QTC Calculation: 431 R Axis:   -64  Text Interpretation: Normal sinus rhythm Left axis deviation Right bundle branch block When compared with ECG of 14-Oct-2023 08:41, No significant change was found Confirmed by Jeffrie Anes ([id number]) on 10/01/2024 2:01:35 PM    Results LABS LDL: 40  DIAGNOSTIC Echocardiogram: Normal ejection fraction, normal right ventricular motion, normal aortic size (02/2024) Risk Assessment/Calculations:            Physical Exam:   VS:  BP 118/62 (BP Location: Right Arm, Patient Position: Sitting, Cuff Size: Normal)   Pulse 98   Ht 5' 8 (1.727 m)   Wt 233 lb 3.2 oz (105.8 kg)   SpO2 94%   BMI 35.46 kg/m    Wt Readings from Last 3 Encounters:  10/01/24 233 lb 3.2 oz (105.8 kg)  09/25/24 229 lb (103.9 kg)  08/21/24 233 lb 11.2 oz (106 kg)    GEN: Well nourished, well developed in no acute distress NECK: No JVD; No carotid bruits CARDIAC: RRR, no murmurs, no rubs, no gallops RESPIRATORY:  Clear to auscultation without rales, wheezing or rhonchi  ABDOMEN: Soft, non-tender, non-distended EXTREMITIES:  No edema; No deformity   ASSESSMENT AND PLAN: .    Assessment and Plan Assessment & Plan Peripheral edema  Intermittent peripheral edema, worse on the right side, present for about three weeks. Initial swelling resolved with low-dose Lasix . Possible contributing factors include verapamil  and Cialis , though verapamil  is less likely to cause edema compared to amlodipine. Recent echocardiogram shows normal heart function and aortic size. No significant cardiac dysfunction noted. Edema may be exacerbated by inactivity and dietary salt intake. - Continue Lasix  as needed for significant swelling. - Use  compression stockings and elevate legs to manage edema. - Monitor salt intake and increase physical activity, such as daily 30-minute walks. - Consider sleep study to evaluate for sleep apnea, which may contribute to hypertension and edema.  Essential hypertension Blood pressure readings show variability, with higher readings in the morning. Possible contribution from sleep apnea, as suggested by family history and symptoms of snoring and daytime fatigue. Verapamil  is used for blood pressure management and may also contribute to peripheral edema. - Continue to monitor blood pressure throughout the day to assess variability. - Consider sleep study to evaluate for sleep apnea, which may contribute to hypertension. - Continue current antihypertensive regimen with verapamil .  CAD - Post LAD stent, 99%. Stable no angina. On Repatha          Dispo: PRN  Signed, Oneil Parchment, MD

## 2024-10-12 ENCOUNTER — Ambulatory Visit: Payer: Self-pay

## 2024-10-12 NOTE — Telephone Encounter (Signed)
 FYI Only or Action Required?: FYI only for provider: [facility] appointment scheduled. Patient refused acute appointment at this time.  Patient was last seen in primary care on 09/25/2024 by Johnny Garnette LABOR, MD.  Called Nurse Triage reporting Leg Swelling.  Symptoms began several months ago.  Interventions attempted: Rest, hydration, or home remedies.  Symptoms are: stable.  Triage Disposition: See Physician Within 24 Hours  Patient/caregiver understands and will follow disposition?: No, wishes to speak with PCP  Copied from CRM #[id number]. Topic: Clinical - Red Word Triage >> Oct 12, 2024  3:50 PM Mercer PEDLAR wrote: Red Word that prompted transfer to Nurse Triage: Swelling in feet and legs as well as burning pain. Patient is requesting a TOC appointment to see Ms. Harlene An at [facility]. Reason for Disposition  [1] MODERATE leg swelling (e.g., swelling extends up to knees) AND [2] new-onset or getting worse  Answer Assessment - Initial Assessment Questions Patient reports bilateral leg swelling that is mild to moderate. Patient reports swelling has been going on and off since June. Patient requesting a TOC appointment to move his care to [facility]. Appointment scheduled for 11/12/2024.  1. ONSET: When did the swelling start? (e.g., minutes, hours, days)     Has been going on and off since June 2. LOCATION: What part of the leg is swollen?  Are both legs swollen or just one leg?     Both legs 3. SEVERITY: How bad is the swelling? (e.g., localized; mild, moderate, severe)     Mild to moderate 4. REDNESS: Is there redness or signs of infection?     no 5. PAIN: Is the swelling painful to touch? If Yes, ask: How painful is it?   (Scale 1-10; mild, moderate or severe)     Some pain at night 6. FEVER: Do you have a fever? If Yes, ask: What is it, how was it measured, and when did it start?      no 7. CAUSE: What do you think is causing the leg swelling?     unsure 8. MEDICAL HISTORY:  Do you have a history of blood clots (e.g., DVT), cancer, heart failure, kidney disease, or liver failure?     no 9. RECURRENT SYMPTOM: Have you had leg swelling before? If Yes, ask: When was the last time? What happened that time?     yes 10. OTHER SYMPTOMS: Do you have any other symptoms? (e.g., chest pain, difficulty breathing)       mp  Protocols used: Leg Swelling and Edema-A-AH

## 2024-10-22 ENCOUNTER — Encounter: Payer: Self-pay | Admitting: Internal Medicine

## 2024-10-26 ENCOUNTER — Encounter: Payer: Self-pay | Admitting: Nurse Practitioner

## 2024-10-26 ENCOUNTER — Encounter

## 2024-10-26 ENCOUNTER — Ambulatory Visit (INDEPENDENT_AMBULATORY_CARE_PROVIDER_SITE_OTHER): Admitting: Nurse Practitioner

## 2024-10-26 VITALS — BP 132/74 | HR 94 | Temp 97.9°F | Ht 69.09 in | Wt 230.2 lb

## 2024-10-26 DIAGNOSIS — E538 Deficiency of other specified B group vitamins: Secondary | ICD-10-CM | POA: Diagnosis not present

## 2024-10-26 DIAGNOSIS — N138 Other obstructive and reflux uropathy: Secondary | ICD-10-CM

## 2024-10-26 DIAGNOSIS — F5101 Primary insomnia: Secondary | ICD-10-CM | POA: Diagnosis not present

## 2024-10-26 DIAGNOSIS — I25119 Atherosclerotic heart disease of native coronary artery with unspecified angina pectoris: Secondary | ICD-10-CM

## 2024-10-26 DIAGNOSIS — B001 Herpesviral vesicular dermatitis: Secondary | ICD-10-CM | POA: Diagnosis not present

## 2024-10-26 DIAGNOSIS — I471 Supraventricular tachycardia, unspecified: Secondary | ICD-10-CM | POA: Diagnosis not present

## 2024-10-26 DIAGNOSIS — M545 Low back pain, unspecified: Secondary | ICD-10-CM | POA: Diagnosis not present

## 2024-10-26 DIAGNOSIS — G8929 Other chronic pain: Secondary | ICD-10-CM

## 2024-10-26 DIAGNOSIS — N401 Enlarged prostate with lower urinary tract symptoms: Secondary | ICD-10-CM

## 2024-10-26 DIAGNOSIS — E785 Hyperlipidemia, unspecified: Secondary | ICD-10-CM

## 2024-10-26 DIAGNOSIS — E669 Obesity, unspecified: Secondary | ICD-10-CM

## 2024-10-26 DIAGNOSIS — R0683 Snoring: Secondary | ICD-10-CM | POA: Diagnosis not present

## 2024-10-26 DIAGNOSIS — K219 Gastro-esophageal reflux disease without esophagitis: Secondary | ICD-10-CM

## 2024-10-26 DIAGNOSIS — I422 Other hypertrophic cardiomyopathy: Secondary | ICD-10-CM | POA: Diagnosis not present

## 2024-10-26 MED ORDER — VITAMIN B-12 1000 MCG SL SUBL: SUBLINGUAL_TABLET | SUBLINGUAL | Status: AC

## 2024-10-26 MED ORDER — MELATONIN 5 MG PO CAPS: 5.0000 mg | ORAL_CAPSULE | Freq: Every day | ORAL | Status: AC

## 2024-10-26 NOTE — Progress Notes (Signed)
 "   Careteam: Patient Care Team: Caro Harlene POUR, NP as PCP - General (Geriatric Medicine) Monetta Redell PARAS, MD as PCP - Cardiology (Cardiology) Blanca Elsie RAMAN, MD as Consulting Physician (Cardiology) Lynnell Nottingham, MD as Consulting Physician (Dermatology)  PLACE OF SERVICE:  [facility]  Advanced Directive information    Allergies[1]  Chief Complaint  Patient presents with   Establish Care    HPI:  Discussed the use of AI scribe software for clinical note transcription with the patient, who gave verbal consent to proceed.  History of Present Illness Edward Curtis is a 69 year old male with coronary artery disease and hyperlipidemia who presents for an established care appointment.  He has a history of coronary artery disease with a stent placement a few years ago. He is on aspirin  81 mg daily, Repatha , and Zetia  for cholesterol management. Initially prescribed verapamil  for hypertrophic cardiomyopathy and supraventricular tachycardia. He does not have frequent chest pain and has not used nitroglycerin .  He has hyperlipidemia managed with Repatha  and Zetia . His LDL levels were well controlled as of June. He experiences myalgias with statins, hence the current management with Repatha .  He experiences edema in his foot, ankle, and lower leg. He takes Lasix  as needed but avoids it on days he takes Cialis  for benign prostatic hyperplasia.  He has a history of back problems, including a significant injury resulting in two herniated discs. He experiences episodic back pain and recently pulled a muscle while twisting, which has been painful. He uses a TENS machine and occasionally takes Aleve for pain management.  He has a vitamin B12 deficiency and takes a sublingual B12 supplement, 1000 mcg, a couple of times a week, though he forgets to take it daily as prescribed.  He takes melatonin 5 mg gummies nightly to aid with sleep due to trouble falling and staying asleep. He  also reports snoring and has considered a sleep apnea test.  He uses Pepcid  or Protonix  daily for indigestion and has Zyrtec or Claritin  for allergies. He occasionally uses Miralax  or Docolax for constipation.     Review of Systems:  Review of Systems  Constitutional:  Negative for chills, fever and weight loss.  HENT:  Negative for tinnitus.   Respiratory:  Negative for cough, sputum production and shortness of breath.   Cardiovascular:  Positive for leg swelling. Negative for chest pain and palpitations.  Gastrointestinal:  Positive for constipation. Negative for abdominal pain, diarrhea and heartburn.  Genitourinary:  Negative for dysuria, frequency and urgency.  Musculoskeletal:  Positive for back pain. Negative for falls, joint pain and myalgias.  Skin: Negative.   Neurological:  Negative for dizziness and headaches.  Psychiatric/Behavioral:  Negative for depression and memory loss. The patient has insomnia.     Past Medical History:  Diagnosis Date   Allergy    seasonal allergies    Arthritis    Asthma    Blood transfusion without reported diagnosis 10/26/1983   Calcium  oxalate renal stones    early 1990's   Cataract    Chicken pox    Chronic neck pain    Depression    GERD (gastroesophageal reflux disease)    Hyperlipidemia 10/10/2013   Hypertrophic obstructive cardiomyopathy(425.11) 10/10/2013   IBS (irritable bowel syndrome) 12/07/2011   Migraines    Neuromuscular disorder (HCC)    radiculopathy left side    Obesity (BMI 30-39.9) 10/10/2013   Seasonal allergies    Past Surgical History:  Procedure Laterality Date   CATARACT  EXTRACTION Left 10/2020   CATARACT EXTRACTION Right 2025   CHOLECYSTECTOMY N/A 06/19/2021   Procedure: LAPAROSCOPIC CHOLECYSTECTOMY WITH POSSIBLE INTRAOPERATIVE CHOLANGIOGRAM;  Surgeon: Signe Mitzie LABOR, MD;  Location: [facility] ORS;  Service: General;  Laterality: N/A;   CORONARY STENT INTERVENTION N/A 03/23/2019   Procedure: CORONARY STENT  INTERVENTION;  Surgeon: Anner Alm ORN, MD;  Location: [facility] INVASIVE CV LAB;  Service: Cardiovascular;  Laterality: N/A;   LEFT HEART CATH AND CORONARY ANGIOGRAPHY N/A 03/23/2019   Procedure: LEFT HEART CATH AND CORONARY ANGIOGRAPHY;  Surgeon: Anner Alm ORN, MD;  Location: [facility] INVASIVE CV LAB;  Service: Cardiovascular;  Laterality: N/A;   left knee surgery  1990   MOUTH SURGERY  1975/1993   pre cancerous mole  2013   prostate procedure  1980   umbilical cyst  1985   Social History:   reports that he has never smoked. He has never used smokeless tobacco. He reports current alcohol use of about 1.0 standard drink of alcohol per week. He reports that he does not use drugs.  Family History  Problem Relation Age of Onset   Arthritis Mother    Hyperlipidemia Mother    Heart disease Mother    Hypertension Mother    Heart disease Father    Hypertension Father    Diabetes Father    Arthritis Paternal Grandmother    Diabetes Paternal Grandmother    Colon cancer Paternal Grandfather    Heart disease Paternal Grandfather    Diabetes Paternal Grandfather    Hyperlipidemia Maternal Grandmother    Hypertension Other        both sets of parents   Esophageal cancer Neg Hx    Rectal cancer Neg Hx    Stomach cancer Neg Hx     Medications: Patient's Medications  New Prescriptions   No medications on file  Previous Medications   ALBUTEROL (VENTOLIN HFA) 108 (90 BASE) MCG/ACT INHALER    SMARTSIG:1-2 Puff(s) Via Inhaler Every 4-6 Hours PRN   ASPIRIN  EC 81 MG TABLET    Take 81 mg by mouth daily.   CYANOCOBALAMIN  (VITAMIN B 12) 500 MCG TABS    Take 500 mcg by mouth daily.   DIPHENHYDRAMINE  (BENADRYL ) 25 MG TABLET    Take 25 mg by mouth daily as needed (allergic reactions).   EPINEPHRINE  (EPIPEN  2-PAK) 0.3 MG/0.3 ML IJ SOAJ INJECTION    Inject 0.3 mLs (0.3 mg total) into the muscle once as needed (for severe allergic reaction). CAll 911 immediately if you have to use this medicine   EVOLOCUMAB   (REPATHA  SURECLICK) 140 MG/ML SOAJ    INJECT 140MG  INTO SKIN EVERY 14 DAYS   EZETIMIBE  (ZETIA ) 10 MG TABLET    TAKE ONE TABLET BY MOUTH DAILY   FAMOTIDINE  (PEPCID ) 20 MG TABLET    Take 1 tablet (20 mg total) by mouth 2 (two) times daily.   FUROSEMIDE  (LASIX ) 20 MG TABLET    Take 1 tablet (20 mg total) by mouth daily as needed (for swelling). Do not take more than once daily.   KETOCONAZOLE (NIZORAL) 2 % CREAM    Apply topically daily.   LORATADINE  (CLARITIN ) 10 MG TABLET    Take 1 tablet (10 mg total) by mouth daily.   LORAZEPAM  (ATIVAN ) 0.5 MG TABLET    Take 1 tablet (0.5 mg total) by mouth every 8 (eight) hours as needed for anxiety.   MELATONIN 1 MG CHEW    Chew 1 mg by mouth daily.   MUPIROCIN  OINTMENT (BACTROBAN ) 2 %  as needed.   NEOMYCIN-POLYMYXIN B-DEXAMETHASONE  (MAXITROL) 3.5-10000-0.1 SUSP    1 drop 2 (two) times daily.   NITROGLYCERIN  (NITROSTAT ) 0.4 MG SL TABLET    Place 1 tablet (0.4 mg total) under the tongue every 5 (five) minutes as needed for chest pain.   ONDANSETRON  (ZOFRAN ) 8 MG TABLET    Take 1 tablet (8 mg total) by mouth every 8 (eight) hours as needed for nausea or vomiting.   PANTOPRAZOLE  (PROTONIX ) 20 MG TABLET    Take 1 tablet (20 mg total) by mouth daily.   POLYETHYLENE GLYCOL 3350  (MIRALAX  PO)    Take by mouth.   TADALAFIL  (CIALIS ) 5 MG TABLET    Take 1 tablet (5 mg total) by mouth every other day. PRN   VALACYCLOVIR (VALTREX) 1000 MG TABLET    Take 1,000 mg by mouth daily as needed.   VERAPAMIL  (CALAN ) 80 MG TABLET    TAKE ONE TABLET DAILY AS NEEDED   VERAPAMIL  (CALAN -SR) 180 MG CR TABLET    TAKE ONE TABLET DAILY  Modified Medications   No medications on file  Discontinued Medications   No medications on file    Physical Exam:  Vitals:   10/26/24 0917  BP: 132/74  Pulse: 94  Temp: 97.9 F (36.6 C)  TempSrc: Temporal  SpO2: 95%  Weight: 230 lb 3.2 oz (104.4 kg)  Height: 5' 9.09 (1.755 m)   Body mass index is 33.9 kg/m. Wt Readings from Last 3  Encounters:  10/26/24 230 lb 3.2 oz (104.4 kg)  10/01/24 233 lb 3.2 oz (105.8 kg)  09/25/24 229 lb (103.9 kg)    Physical Exam Constitutional:      General: He is not in acute distress.    Appearance: He is well-developed. He is not diaphoretic.  HENT:     Head: Normocephalic and atraumatic.     Right Ear: External ear normal.     Left Ear: External ear normal.     Mouth/Throat:     Pharynx: No oropharyngeal exudate.  Eyes:     Conjunctiva/sclera: Conjunctivae normal.     Pupils: Pupils are equal, round, and reactive to light.  Cardiovascular:     Rate and Rhythm: Normal rate and regular rhythm.     Heart sounds: Normal heart sounds.  Pulmonary:     Effort: Pulmonary effort is normal.     Breath sounds: Normal breath sounds.  Abdominal:     General: Bowel sounds are normal.     Palpations: Abdomen is soft.  Musculoskeletal:        General: No tenderness.     Cervical back: Normal range of motion and neck supple.     Right lower leg: No edema.     Left lower leg: No edema.  Skin:    General: Skin is warm and dry.  Neurological:     Mental Status: He is alert and oriented to person, place, and time.     Motor: Weakness present.     Gait: Gait abnormal.     Labs reviewed: Basic Metabolic Panel: Recent Labs    09/25/24 1417  NA 142  K 3.8  CL 107  CO2 27  GLUCOSE 107*  BUN 15  CREATININE 1.25  CALCIUM  9.5  TSH 4.11   Liver Function Tests: Recent Labs    09/25/24 1417  AST 20  ALT 22  ALKPHOS 60  BILITOT 0.5  PROT 6.9  ALBUMIN  4.6   No results for input(s): LIPASE, AMYLASE in the last  8760 hours. No results for input(s): AMMONIA in the last 8760 hours. CBC: Recent Labs    09/25/24 1417  WBC 6.7  NEUTROABS 4.4  HGB 15.2  HCT 45.0  MCV 95.7  PLT 199.0   Lipid Panel: Recent Labs    04/03/24 0810  CHOL 108  HDL 38*  LDLCALC 40  TRIG 818*  CHOLHDL 2.8   TSH: Recent Labs    09/25/24 1417  TSH 4.11   A1C: Lab Results   Component Value Date   HGBA1C 5.4 09/25/2024     Assessment/Plan Assessment and Plan Assessment & Plan Chronic coronary artery disease with history of stent placement LDL levels well controlled with current regimen. - Continue aspirin  81 mg daily. - Continue Repatha  and Zetia  for cholesterol management.  Hypertrophic cardiomyopathy Condition stable with verapamil . - Continue verapamil  for management.  Supraventricular tachycardia Occasional episodes managed with varapamil. - Continue verapamil  for management. - Use Brevital 80 mg as needed for tachycardia.  Hyperlipidemia LDL levels well controlled with Repatha  and Zetia . - Continue Repatha  and Zetia  for cholesterol management.  Chronic low back pain with lumbar disc disease and arthritis Recent exacerbation due to muscle strain. Managed with chiropractic care and Aleve. - Continue chiropractic care. - Consider physical therapy if pain persists.  Venous insufficiency with lower extremity edema Likely multifactorial. Managed with Lasix , low sodium diet, and leg elevation. - Use Lasix  as needed - continue low sodium diet. - Elevate legs to reduce swelling.  Vitamin B12 deficiency Adherence to sublingual B12 supplementation inconsistent. - Increase adherence to sublingual B12 supplementation.  Gastroesophageal reflux disease Managed with Pepcid  or Protonix  as advised by gastroenterologist. - Continue Pepcid  or Protonix  daily as advised by gastroenterologist.  Benign prostatic hyperplasia with lower urinary tract symptoms Managed with Cialis . - Continue Cialis  every other day.  Obesity BMI 33. Discussed weight loss benefits on health and sleep apnea risk. - Implement dietary changes focusing on low sodium and balanced nutrition. - Consider weight loss strategies.  Primary insomnia Managed with melatonin gummies. - Continue melatonin gummies as needed, not exceeding 5 mg.  Herpes labialis Managed with Valtrex  as needed. - Use Valtrex as needed for herpes labialis outbreaks.  General health maintenance Annual wellness visit overdue. Discussed scheduling options. - Scheduled annual wellness visit.   Return in about 3 months (around 01/24/2025) for routine follow up.  Baley Shands K. Caro BODILY [facility] [phone]      [1]  Allergies Allergen Reactions   Ciprofloxacin  Other (See Comments)    Possible leg swelling, pain, unable to walk   Iodinated Contrast Media Anaphylaxis, Hives, Itching and Swelling    Patient must be premedicated with 13 hour protocol prior to IV contrast administration   Penicillins Hives    Did it involve swelling of the face/tongue/throat, SOB, or low BP? No Did it involve sudden or severe rash/hives, skin peeling, or any reaction on the inside of your mouth or nose? Yes Did you need to seek medical attention at a hospital or doctor's office? Yes When did it last happen?      25 + years ago If all above answers are NO, may proceed with cephalosporin use.    Yellow Jacket Venom Anaphylaxis    Swelling    Atorvastatin      myaglias   Clindamycin/Lincomycin Diarrhea and Nausea Only   Erythromycin Diarrhea and Nausea Only   Ibuprofen Other (See Comments)    Causes tachycardia   Keflex [Cephalexin]  Rash hives   Other Itching and Other (See Comments)     Walnut trees and walnuts , sores in mouth   Rosuvastatin      myaligias   Sulfa Antibiotics Hives   Latex Rash    Developed rash 06/2017 that progressed.  Allergy testing positive for latex allergy.  Later determined rash was 2/2 latex in [employer] foods.   "

## 2024-10-26 NOTE — Patient Instructions (Addendum)
 1) schedule AWV on check out- overdue  2). To take Loratadine  or cetrizine (generic for Claritin  or zyrtec) 10 mg by mouth daily for allergies.   3) encouraged to elevate legs above level of heart as tolerates, low sodium diet, compression hose as tolerates (on in am, off in pm)

## 2024-11-08 LAB — OPHTHALMOLOGY REPORT-SCANNED

## 2024-11-12 ENCOUNTER — Encounter: Admitting: Nurse Practitioner

## 2024-12-17 ENCOUNTER — Ambulatory Visit: Admitting: Nurse Practitioner

## 2025-01-28 ENCOUNTER — Ambulatory Visit: Admitting: Nurse Practitioner

## 2025-04-08 ENCOUNTER — Other Ambulatory Visit (HOSPITAL_COMMUNITY)

## 2025-09-27 ENCOUNTER — Encounter (INDEPENDENT_AMBULATORY_CARE_PROVIDER_SITE_OTHER): Admitting: Ophthalmology
